# Patient Record
Sex: Male | Born: 1955 | Race: White | Hispanic: No | Smoking: Never smoker
Health system: Southern US, Community
[De-identification: ages and names within clinical notes are randomized; demographics above are authoritative.]

## PROBLEM LIST (undated history)

## (undated) DIAGNOSIS — E785 Hyperlipidemia, unspecified: Secondary | ICD-10-CM

## (undated) DIAGNOSIS — I10 Essential (primary) hypertension: Secondary | ICD-10-CM

## (undated) DIAGNOSIS — E119 Type 2 diabetes mellitus without complications: Secondary | ICD-10-CM

## (undated) DIAGNOSIS — C801 Malignant (primary) neoplasm, unspecified: Secondary | ICD-10-CM

## (undated) HISTORY — PX: NO PAST SURGERIES: SHX2092

## (undated) HISTORY — PX: SKIN CANCER EXCISION: SHX779

## (undated) HISTORY — PX: OTHER SURGICAL HISTORY: SHX169

---

## 1999-01-08 ENCOUNTER — Encounter: Admission: RE | Admit: 1999-01-08 | Discharge: 1999-01-08 | Payer: Self-pay | Admitting: Dermatology

## 1999-12-17 ENCOUNTER — Encounter: Payer: Self-pay | Admitting: Dermatology

## 1999-12-17 ENCOUNTER — Encounter: Admission: RE | Admit: 1999-12-17 | Discharge: 1999-12-17 | Payer: Self-pay | Admitting: Dermatology

## 2000-12-29 ENCOUNTER — Encounter: Payer: Self-pay | Admitting: Dermatology

## 2000-12-29 ENCOUNTER — Encounter: Admission: RE | Admit: 2000-12-29 | Discharge: 2000-12-29 | Payer: Self-pay | Admitting: Dermatology

## 2004-07-09 ENCOUNTER — Ambulatory Visit: Payer: Self-pay | Admitting: Internal Medicine

## 2005-01-01 ENCOUNTER — Ambulatory Visit: Payer: Self-pay | Admitting: Internal Medicine

## 2005-01-22 ENCOUNTER — Ambulatory Visit: Payer: Self-pay | Admitting: Internal Medicine

## 2005-07-04 ENCOUNTER — Ambulatory Visit: Payer: Self-pay | Admitting: Internal Medicine

## 2005-09-20 ENCOUNTER — Ambulatory Visit: Payer: Self-pay | Admitting: Internal Medicine

## 2006-08-07 DIAGNOSIS — I10 Essential (primary) hypertension: Secondary | ICD-10-CM | POA: Insufficient documentation

## 2006-08-07 DIAGNOSIS — J309 Allergic rhinitis, unspecified: Secondary | ICD-10-CM | POA: Insufficient documentation

## 2006-08-11 ENCOUNTER — Encounter: Payer: Self-pay | Admitting: Internal Medicine

## 2006-08-11 ENCOUNTER — Ambulatory Visit: Payer: Self-pay | Admitting: Internal Medicine

## 2006-08-11 DIAGNOSIS — E785 Hyperlipidemia, unspecified: Secondary | ICD-10-CM

## 2007-01-21 ENCOUNTER — Encounter (INDEPENDENT_AMBULATORY_CARE_PROVIDER_SITE_OTHER): Payer: Self-pay | Admitting: *Deleted

## 2007-02-17 ENCOUNTER — Ambulatory Visit: Payer: Self-pay | Admitting: Internal Medicine

## 2007-02-17 DIAGNOSIS — Z85820 Personal history of malignant melanoma of skin: Secondary | ICD-10-CM

## 2007-02-18 ENCOUNTER — Ambulatory Visit: Payer: Self-pay | Admitting: Internal Medicine

## 2007-02-20 LAB — CONVERTED CEMR LAB
ALT: 31 units/L (ref 0–53)
AST: 23 units/L (ref 0–37)
BUN: 17 mg/dL (ref 6–23)
CO2: 29 meq/L (ref 19–32)
Calcium: 9.8 mg/dL (ref 8.4–10.5)
Chloride: 104 meq/L (ref 96–112)
Cholesterol: 183 mg/dL (ref 0–200)
Creatinine, Ser: 0.8 mg/dL (ref 0.4–1.5)
GFR calc Af Amer: 131 mL/min
GFR calc non Af Amer: 108 mL/min
Glucose, Bld: 121 mg/dL — ABNORMAL HIGH (ref 70–99)
HDL: 42.4 mg/dL (ref >39.0)
Hemoglobin: 13 g/dL (ref 13.0–17.0)
LDL Cholesterol: 107 mg/dL — ABNORMAL HIGH (ref 0–99)
Potassium: 4.9 meq/L (ref 3.5–5.1)
Sodium: 140 meq/L (ref 135–145)
Total CHOL/HDL Ratio: 4.3
Triglycerides: 167 mg/dL — ABNORMAL HIGH (ref 0–149)
VLDL: 33 mg/dL (ref 0–40)

## 2007-02-26 LAB — CONVERTED CEMR LAB
ALT: 31 units/L (ref 0–53)
AST: 23 units/L (ref 0–37)
BUN: 17 mg/dL (ref 6–23)
CO2: 29 meq/L (ref 19–32)
Calcium: 9.8 mg/dL (ref 8.4–10.5)
Chloride: 104 meq/L (ref 96–112)
Cholesterol: 183 mg/dL (ref 0–200)
Creatinine, Ser: 0.8 mg/dL (ref 0.4–1.5)
GFR calc Af Amer: 131 mL/min
GFR calc non Af Amer: 108 mL/min
Glucose, Bld: 121 mg/dL — ABNORMAL HIGH (ref 70–99)
HDL: 42.4 mg/dL (ref >39.0)
Hemoglobin: 13 g/dL (ref 13.0–17.0)
Hgb A1c MFr Bld: 6.5 % — ABNORMAL HIGH (ref 4.6–6.0)
LDL Cholesterol: 107 mg/dL — ABNORMAL HIGH (ref 0–99)
Potassium: 4.9 meq/L (ref 3.5–5.1)
Sodium: 140 meq/L (ref 135–145)
Total CHOL/HDL Ratio: 4.3
Triglycerides: 167 mg/dL — ABNORMAL HIGH (ref 0–149)
VLDL: 33 mg/dL (ref 0–40)

## 2007-03-24 ENCOUNTER — Encounter: Payer: Self-pay | Admitting: Internal Medicine

## 2007-07-24 ENCOUNTER — Telehealth (INDEPENDENT_AMBULATORY_CARE_PROVIDER_SITE_OTHER): Payer: Self-pay | Admitting: *Deleted

## 2007-08-19 ENCOUNTER — Telehealth: Payer: Self-pay | Admitting: Internal Medicine

## 2007-10-12 ENCOUNTER — Ambulatory Visit: Payer: Self-pay | Admitting: Internal Medicine

## 2007-10-12 DIAGNOSIS — R7309 Other abnormal glucose: Secondary | ICD-10-CM

## 2007-10-19 LAB — CONVERTED CEMR LAB
ALT: 32 units/L (ref 0–53)
AST: 23 units/L (ref 0–37)
BUN: 17 mg/dL (ref 6–23)
CO2: 29 meq/L (ref 19–32)
Calcium: 9.6 mg/dL (ref 8.4–10.5)
Chloride: 105 meq/L (ref 96–112)
Creatinine, Ser: 0.9 mg/dL (ref 0.4–1.5)
Creatinine,U: 76.6 mg/dL
GFR calc Af Amer: 114 mL/min
GFR calc non Af Amer: 94 mL/min
Glucose, Bld: 119 mg/dL — ABNORMAL HIGH (ref 70–99)
Hgb A1c MFr Bld: 6.5 % — ABNORMAL HIGH (ref 4.6–6.0)
Microalb Creat Ratio: 6.5 mg/g (ref 0.0–30.0)
Microalb, Ur: 0.5 mg/dL (ref 0.0–1.9)
Potassium: 3.9 meq/L (ref 3.5–5.1)
Sodium: 141 meq/L (ref 135–145)

## 2008-09-06 ENCOUNTER — Telehealth (INDEPENDENT_AMBULATORY_CARE_PROVIDER_SITE_OTHER): Payer: Self-pay | Admitting: *Deleted

## 2008-12-14 ENCOUNTER — Encounter (INDEPENDENT_AMBULATORY_CARE_PROVIDER_SITE_OTHER): Payer: Self-pay | Admitting: *Deleted

## 2009-02-13 ENCOUNTER — Ambulatory Visit: Payer: Self-pay | Admitting: Internal Medicine

## 2009-02-13 LAB — CONVERTED CEMR LAB: Hgb A1c MFr Bld: 6.3 % (ref 4.6–6.5)

## 2009-02-20 LAB — CONVERTED CEMR LAB
ALT: 33 units/L (ref 0–53)
AST: 24 units/L (ref 0–37)
BUN: 15 mg/dL (ref 6–23)
Basophils Absolute: 0 10*3/uL (ref 0.0–0.1)
Basophils Relative: 0.7 % (ref 0.0–3.0)
CO2: 28 meq/L (ref 19–32)
Calcium: 9.2 mg/dL (ref 8.4–10.5)
Chloride: 104 meq/L (ref 96–112)
Cholesterol: 177 mg/dL (ref 0–200)
Creatinine, Ser: 0.9 mg/dL (ref 0.4–1.5)
Eosinophils Absolute: 0.1 10*3/uL (ref 0.0–0.7)
Eosinophils Relative: 2.4 % (ref 0.0–5.0)
GFR calc non Af Amer: 93.14 mL/min (ref >60)
Glucose, Bld: 136 mg/dL — ABNORMAL HIGH (ref 70–99)
HCT: 44.3 % (ref 39.0–52.0)
HDL: 47 mg/dL (ref >39.00)
Hemoglobin: 14.5 g/dL (ref 13.0–17.0)
LDL Cholesterol: 109 mg/dL — ABNORMAL HIGH (ref 0–99)
Lymphocytes Relative: 23.8 % (ref 12.0–46.0)
Lymphs Abs: 1.2 10*3/uL (ref 0.7–4.0)
MCHC: 32.8 g/dL (ref 30.0–36.0)
MCV: 95.8 fL (ref 78.0–100.0)
Monocytes Absolute: 0.4 10*3/uL (ref 0.1–1.0)
Monocytes Relative: 7.4 % (ref 3.0–12.0)
Neutro Abs: 3.2 10*3/uL (ref 1.4–7.7)
Neutrophils Relative %: 65.7 % (ref 43.0–77.0)
Platelets: 197 10*3/uL (ref 150.0–400.0)
Potassium: 4.2 meq/L (ref 3.5–5.1)
RBC: 4.62 M/uL (ref 4.22–5.81)
RDW: 12.3 % (ref 11.5–14.6)
Sodium: 138 meq/L (ref 135–145)
Total CHOL/HDL Ratio: 4
Triglycerides: 105 mg/dL (ref 0.0–149.0)
VLDL: 21 mg/dL (ref 0.0–40.0)
WBC: 4.9 10*3/uL (ref 4.5–10.5)

## 2009-10-31 ENCOUNTER — Telehealth (INDEPENDENT_AMBULATORY_CARE_PROVIDER_SITE_OTHER): Payer: Self-pay | Admitting: *Deleted

## 2009-11-22 ENCOUNTER — Telehealth (INDEPENDENT_AMBULATORY_CARE_PROVIDER_SITE_OTHER): Payer: Self-pay | Admitting: *Deleted

## 2010-04-26 NOTE — Progress Notes (Signed)
Summary: refill request/appt--has problem --no time off  Phone Note Refill Request Call back at [phone] Message from:  Pharmacy on November 22, 2009 8:01 AM  Refills Requested: Medication #1:  ZOCOR 40 MG TABS 1 by mouth at bedtime. NEEDS CPX.Marland Kitchen   Dosage confirmed as above?Dosage Confirmed   Supply Requested: 3 months [facility] [address]  Next Appointment Scheduled: none Initial call taken by: Lavell Islam,  November 22, 2009 8:02 AM  Follow-up for Phone Call        Pt has been called and we mailed a letter to let pt know they needed a CPX. No pending appts. Army Fossa CMA  November 22, 2009 8:21 AM   Additional Follow-up for Phone Call Additional follow up Details #1::        denied Jose E. Paz MD  November 22, 2009 3:53 PM     Additional Follow-up for Phone Call Additional follow up Details #2::    Please call pt to see if he will schedule appt, cannot refill meds. Army Fossa CMA  November 22, 2009 3:59 PM   Additional Follow-up for Phone Call Additional follow up Details #3:: Details for Additional Follow-up Action Taken: lmtcb.Karoline Caldwell Negrete  November 23, 2009 12:11 PM  LMTCB.Jerolyn Shin  November 28, 2009 9:48 AM  Patient wont get another day off until Thanksgiving week---has used up all vacation time for this year---what should he do??  ..Jerolyn Shin  November 29, 2009 11:27 AM needs to talk  with his supervisor, he does not need a whole day off, if he comes at 8am he will be at work by 9 AM Jose E. 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 Paz MD  December 01, 2009 9:48 AM   Will mail another letter advising him that he does need a physical.  If he gets any time off, will ask him to call  to see if he can get on the schedule.  If not, can we schedule a physical for the time that he is off around Thanksgiving? Marland KitchenJerolyn Shin  December 12, 2009 8:53 AM

## 2010-04-26 NOTE — Progress Notes (Signed)
Summary: CPX needed - mailed letter  Phone Note Outgoing Call   Summary of Call: I refilled pts meds for 1 month only---- needs to schedule CPX. Army Fossa CMA  October 31, 2009 7:51 AM   Follow-up for Phone Call        lmom to schedule cpx .Marland KitchenOkey Regal Spring  November 01, 2009 11:15 AM patient didnt return call - mailed letter  .Marland KitchenOkey Regal Spring  November 03, 2009 10:58 AM

## 2011-08-12 ENCOUNTER — Encounter: Payer: Self-pay | Admitting: Internal Medicine

## 2012-07-02 ENCOUNTER — Encounter: Payer: Self-pay | Admitting: Internal Medicine

## 2014-02-28 ENCOUNTER — Other Ambulatory Visit: Payer: Self-pay | Admitting: Family Medicine

## 2014-02-28 DIAGNOSIS — N50819 Testicular pain, unspecified: Secondary | ICD-10-CM

## 2014-02-28 DIAGNOSIS — N5089 Other specified disorders of the male genital organs: Secondary | ICD-10-CM

## 2014-03-01 ENCOUNTER — Ambulatory Visit
Admission: RE | Admit: 2014-03-01 | Discharge: 2014-03-01 | Disposition: A | Discharge status: Home / Self Care | Payer: BC Managed Care – PPO | Source: Ambulatory Visit | Attending: Family Medicine | Admitting: Family Medicine

## 2014-03-01 ENCOUNTER — Encounter (INDEPENDENT_AMBULATORY_CARE_PROVIDER_SITE_OTHER): Payer: Self-pay

## 2014-03-01 DIAGNOSIS — N50819 Testicular pain, unspecified: Secondary | ICD-10-CM

## 2014-03-01 DIAGNOSIS — N5089 Other specified disorders of the male genital organs: Secondary | ICD-10-CM

## 2016-05-15 IMAGING — US US SCROTUM
1 series · 14 of 25 positions shown · non-contrast
Comparison: None.

CLINICAL DATA: Bilateral scrotal swelling and pain

EXAM:
SCROTAL ULTRASOUND
DOPPLER ULTRASOUND OF THE TESTICLES
TECHNIQUE: Complete ultrasound examination of the testicles, epididymis, and
other scrotal structures was performed. Color and spectral Doppler
ultrasound were also utilized to evaluate blood flow to the
testicles.

[Series 1: us scrotum · 0.11mm/px · 14 of 81 slices shown]
[im 1/81]
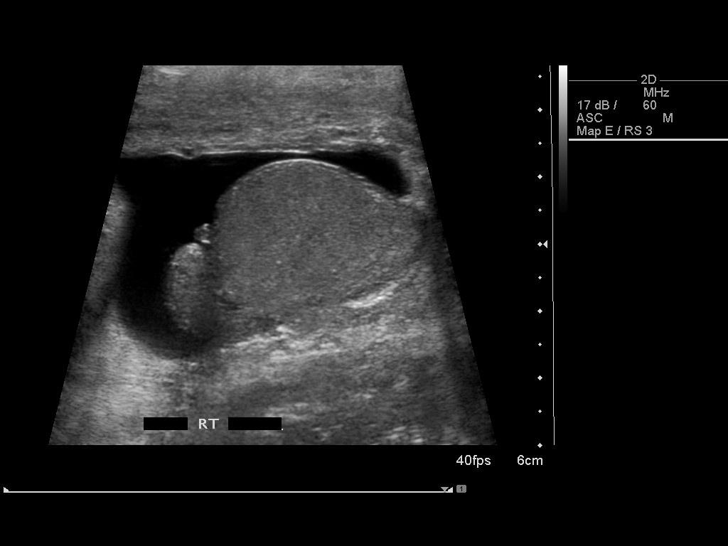
[im 7/81]
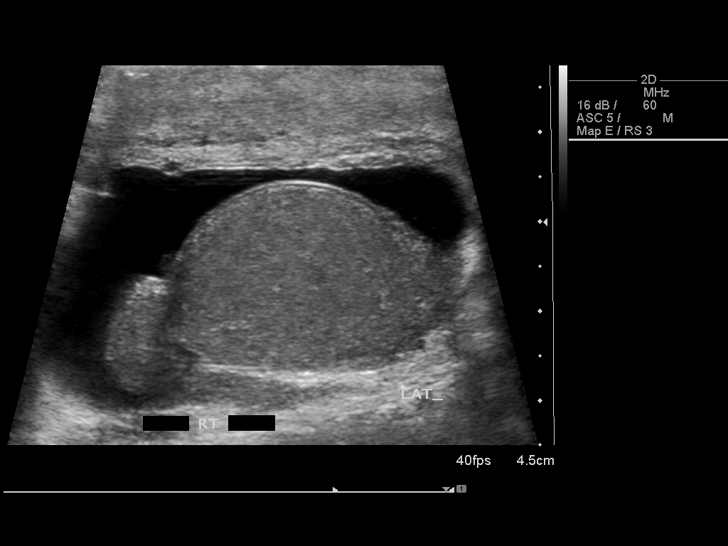
[im 14/81]
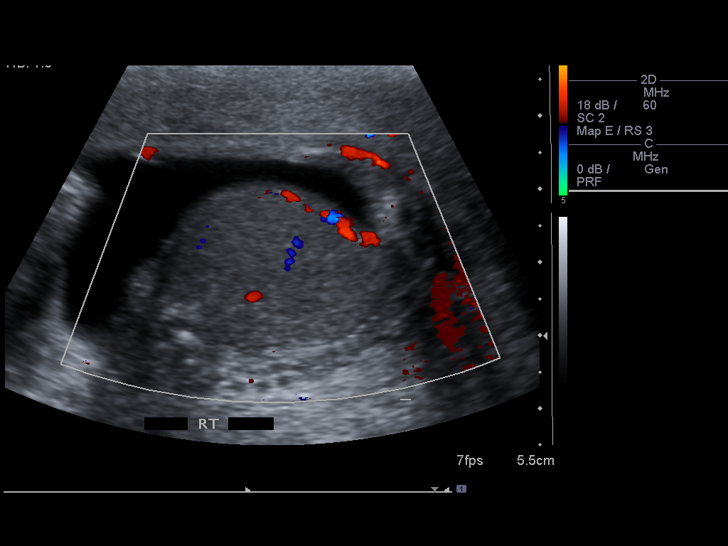
[im 21/81]
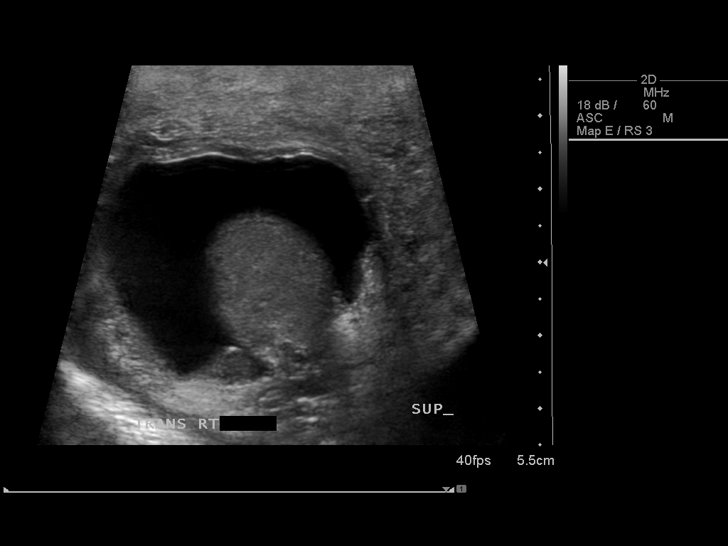
[im 27/81]
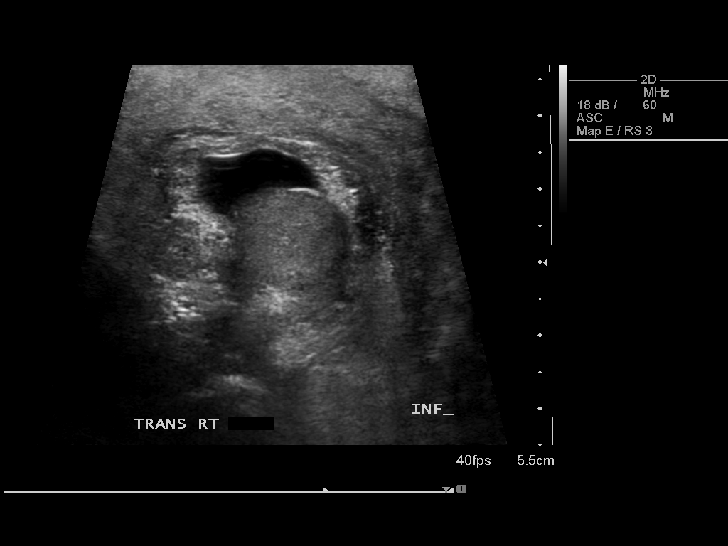
[im 31/81]
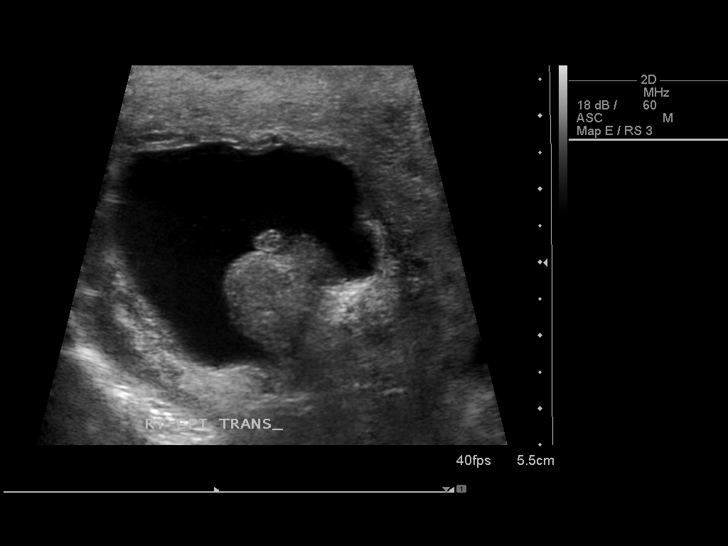
[im 37/81]
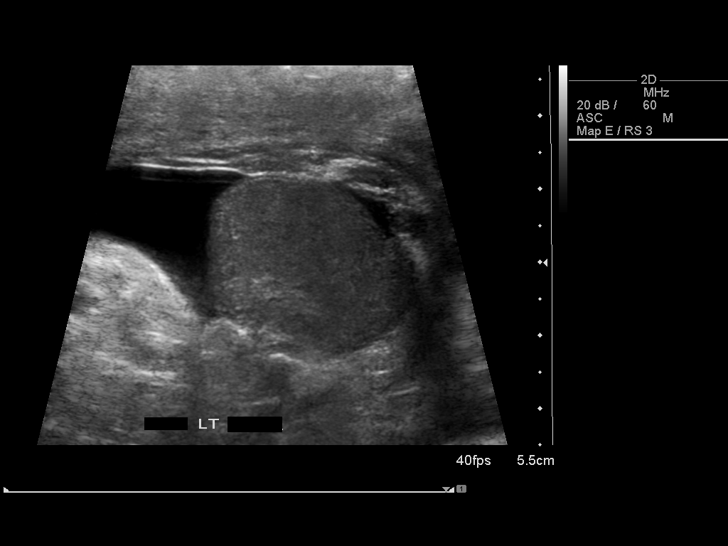
[im 44/81]
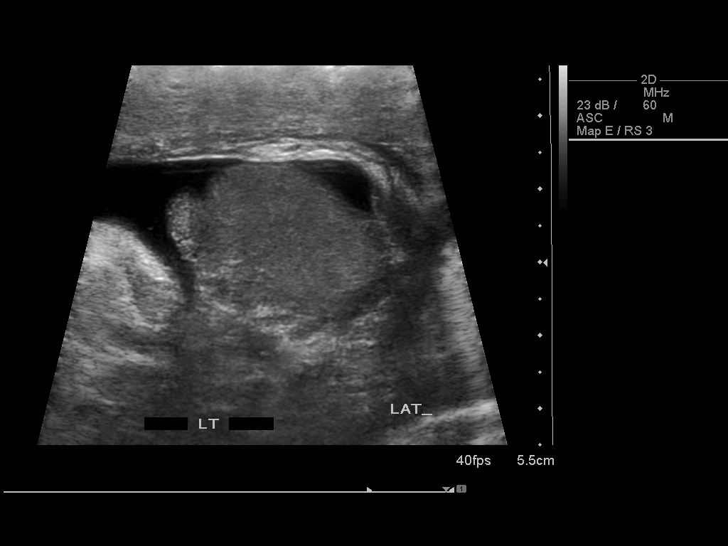
[im 51/81]
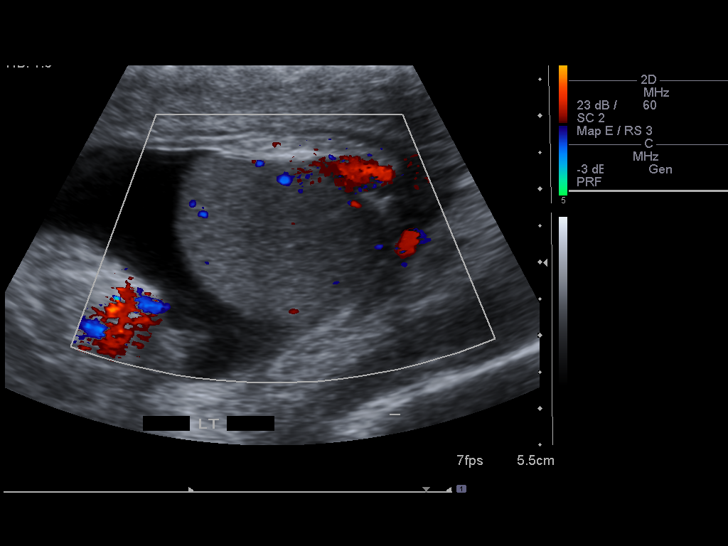
[im 54/81]
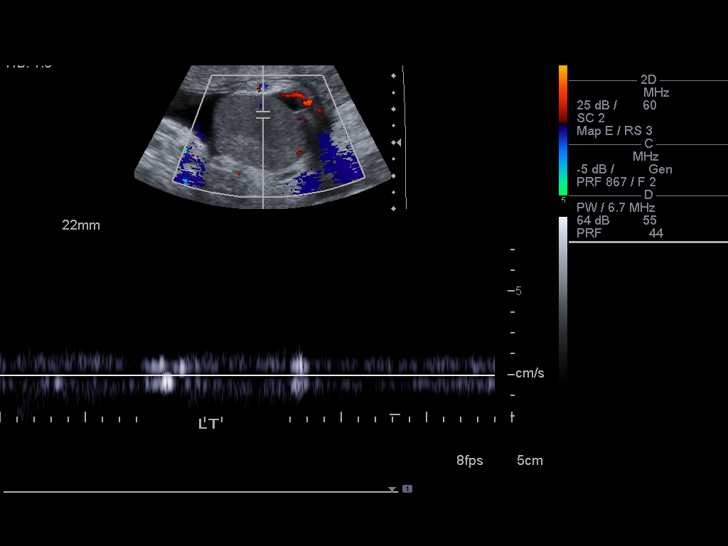
[im 61/81]
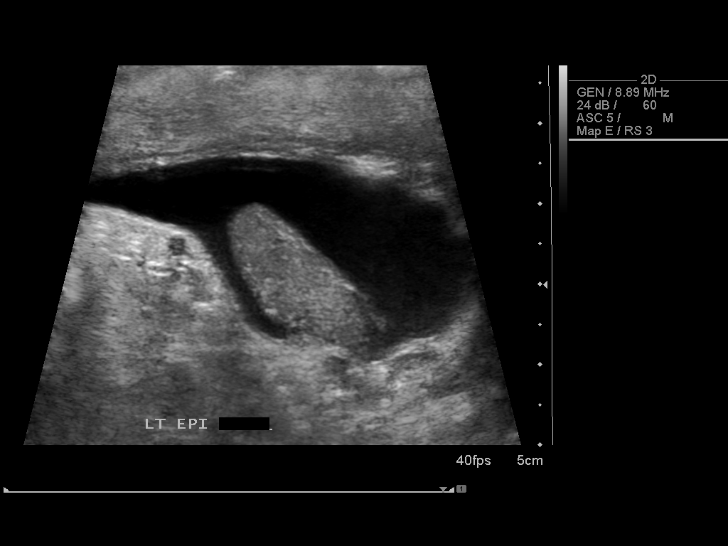
[im 67/81]
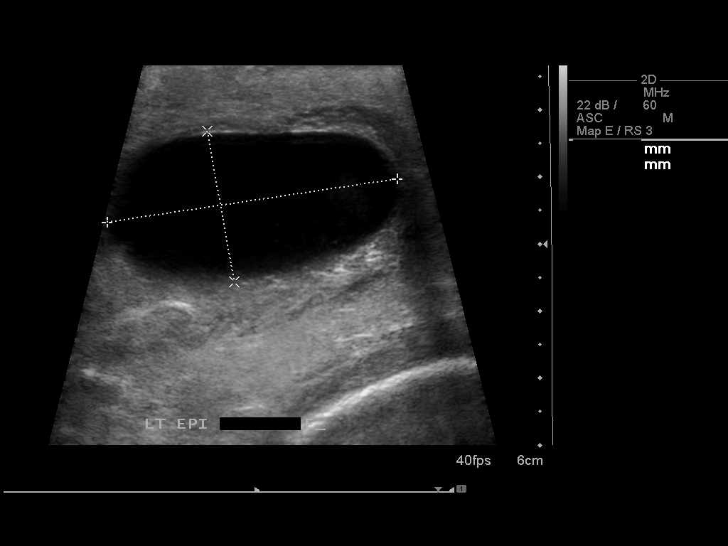
[im 74/81]
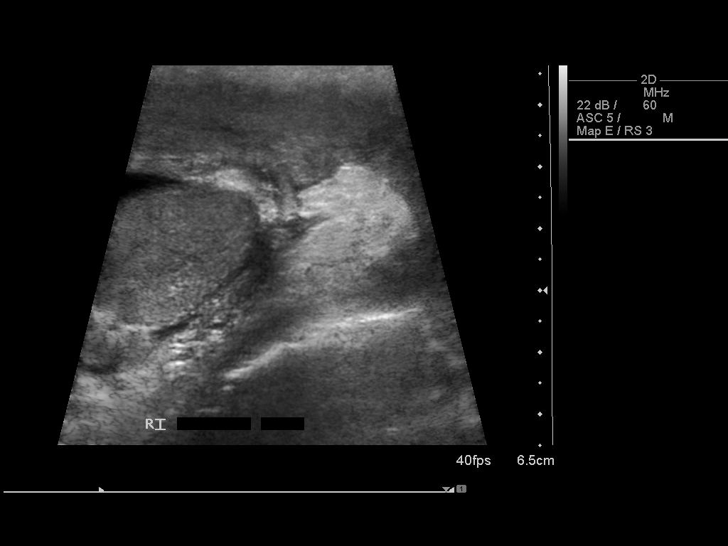
[im 81/81]
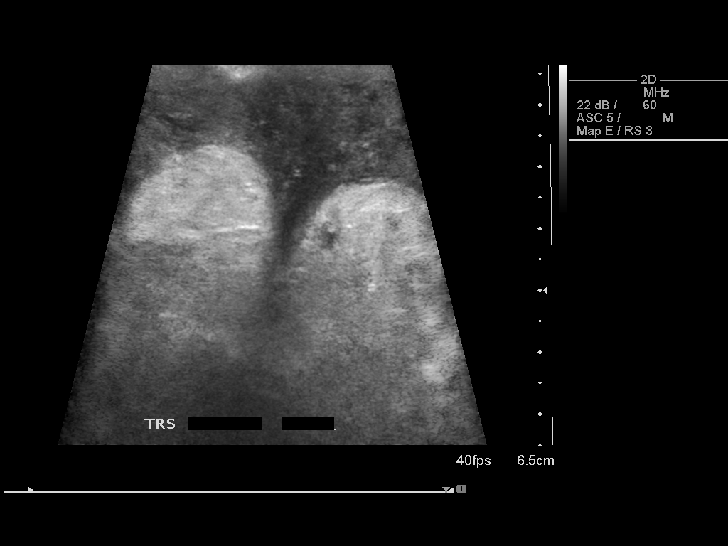

[14 of 25 positions shown; findings below may reference images not displayed]

FINDINGS: Right testicle

Measurements: 3.2 x 2.3 x 2.5 cm. No mass or microlithiasis
visualized.

Left testicle

Measurements: 2.9 x 2.4 x 2.3 cm. No mass or microlithiasis
visualized.

Right epididymis:  Normal in size and appearance.

Left epididymis:  Normal in size and appearance.

Hydrocele:  Moderate bilateral hydroceles.

Varicocele:  None visualized.

Pulsed Doppler interrogation of both testes demonstrates low
resistance arterial and venous waveforms bilaterally.

Additional comments: Scrotal wall thickening/edema. No discrete
abscess is visualized.
IMPRESSION: Normal sonographic appearance of the bilateral testes.

No evidence of testicular torsion.

Moderate bilateral hydroceles.

Scrotal wall thickening/edema.  No discrete abscess is visualized.

## 2019-05-27 ENCOUNTER — Ambulatory Visit: Payer: Medicare Other | Attending: Internal Medicine

## 2019-05-27 DIAGNOSIS — Z23 Encounter for immunization: Secondary | ICD-10-CM

## 2019-05-27 NOTE — Progress Notes (Signed)
   Covid-19 Vaccination Clinic  Name:  JACOBY ZANNI    MRN: [id number] DOB: 06-11-1955  05/27/2019  Mr. Wahlen was observed post Covid-19 immunization for 15 minutes without incident. He was provided with Vaccine Information Sheet and instruction to access the V-Safe system.   Mr. Jorgensen was instructed to call 911 with any severe reactions post vaccine: Marland Kitchen Difficulty breathing  . Swelling of face and throat  . A fast heartbeat  . A bad rash all over body  . Dizziness and weakness

## 2019-06-21 ENCOUNTER — Ambulatory Visit: Payer: Medicare Other | Attending: Internal Medicine

## 2019-06-21 DIAGNOSIS — Z23 Encounter for immunization: Secondary | ICD-10-CM

## 2019-06-21 NOTE — Progress Notes (Signed)
° °  Covid-19 Vaccination Clinic  Name:  Dwayne Nelson    MRN: [id number] DOB: 12-Sep-1955  06/21/2019  [name] was observed post Covid-19 immunization for 15 minutes without incident. He was provided with Vaccine Information Sheet and instruction to access the V-Safe system.   [name] was instructed to call 911 with any severe reactions post vaccine:  Difficulty breathing   Swelling of face and throat   A fast heartbeat   A bad rash all over body   Dizziness and weakness   Immunizations Administered    Name Date Dose VIS Date Route   Pfizer COVID-19 Vaccine 06/21/2019 12:12 PM 0.3 mL 03/05/2019 Intramuscular   Manufacturer: [employer], [employer]   Lot: [id number]   NDC: [address]-4

## 2020-05-11 DIAGNOSIS — E119 Type 2 diabetes mellitus without complications: Secondary | ICD-10-CM | POA: Diagnosis not present

## 2020-05-11 DIAGNOSIS — E782 Mixed hyperlipidemia: Secondary | ICD-10-CM | POA: Diagnosis not present

## 2020-05-11 DIAGNOSIS — E1165 Type 2 diabetes mellitus with hyperglycemia: Secondary | ICD-10-CM | POA: Diagnosis not present

## 2020-05-11 DIAGNOSIS — I10 Essential (primary) hypertension: Secondary | ICD-10-CM | POA: Diagnosis not present

## 2020-05-19 DIAGNOSIS — F5101 Primary insomnia: Secondary | ICD-10-CM | POA: Diagnosis not present

## 2020-05-19 DIAGNOSIS — E782 Mixed hyperlipidemia: Secondary | ICD-10-CM | POA: Diagnosis not present

## 2020-05-19 DIAGNOSIS — E1165 Type 2 diabetes mellitus with hyperglycemia: Secondary | ICD-10-CM | POA: Diagnosis not present

## 2020-05-19 DIAGNOSIS — E291 Testicular hypofunction: Secondary | ICD-10-CM | POA: Diagnosis not present

## 2020-05-19 DIAGNOSIS — Z125 Encounter for screening for malignant neoplasm of prostate: Secondary | ICD-10-CM | POA: Diagnosis not present

## 2020-05-19 DIAGNOSIS — I10 Essential (primary) hypertension: Secondary | ICD-10-CM | POA: Diagnosis not present

## 2020-06-13 DIAGNOSIS — E119 Type 2 diabetes mellitus without complications: Secondary | ICD-10-CM | POA: Diagnosis not present

## 2020-06-13 DIAGNOSIS — E1165 Type 2 diabetes mellitus with hyperglycemia: Secondary | ICD-10-CM | POA: Diagnosis not present

## 2020-06-13 DIAGNOSIS — I10 Essential (primary) hypertension: Secondary | ICD-10-CM | POA: Diagnosis not present

## 2020-06-13 DIAGNOSIS — E782 Mixed hyperlipidemia: Secondary | ICD-10-CM | POA: Diagnosis not present

## 2020-07-21 DIAGNOSIS — I10 Essential (primary) hypertension: Secondary | ICD-10-CM | POA: Diagnosis not present

## 2020-07-21 DIAGNOSIS — E782 Mixed hyperlipidemia: Secondary | ICD-10-CM | POA: Diagnosis not present

## 2020-07-21 DIAGNOSIS — E119 Type 2 diabetes mellitus without complications: Secondary | ICD-10-CM | POA: Diagnosis not present

## 2020-07-21 DIAGNOSIS — E1165 Type 2 diabetes mellitus with hyperglycemia: Secondary | ICD-10-CM | POA: Diagnosis not present

## 2020-09-05 DIAGNOSIS — E119 Type 2 diabetes mellitus without complications: Secondary | ICD-10-CM | POA: Diagnosis not present

## 2020-09-05 DIAGNOSIS — E782 Mixed hyperlipidemia: Secondary | ICD-10-CM | POA: Diagnosis not present

## 2020-09-05 DIAGNOSIS — E1165 Type 2 diabetes mellitus with hyperglycemia: Secondary | ICD-10-CM | POA: Diagnosis not present

## 2020-09-05 DIAGNOSIS — I10 Essential (primary) hypertension: Secondary | ICD-10-CM | POA: Diagnosis not present

## 2020-10-05 DIAGNOSIS — E119 Type 2 diabetes mellitus without complications: Secondary | ICD-10-CM | POA: Diagnosis not present

## 2020-10-05 DIAGNOSIS — E1165 Type 2 diabetes mellitus with hyperglycemia: Secondary | ICD-10-CM | POA: Diagnosis not present

## 2020-10-05 DIAGNOSIS — E782 Mixed hyperlipidemia: Secondary | ICD-10-CM | POA: Diagnosis not present

## 2020-10-05 DIAGNOSIS — I10 Essential (primary) hypertension: Secondary | ICD-10-CM | POA: Diagnosis not present

## 2020-11-20 DIAGNOSIS — E782 Mixed hyperlipidemia: Secondary | ICD-10-CM | POA: Diagnosis not present

## 2020-11-20 DIAGNOSIS — I10 Essential (primary) hypertension: Secondary | ICD-10-CM | POA: Diagnosis not present

## 2020-11-20 DIAGNOSIS — E1165 Type 2 diabetes mellitus with hyperglycemia: Secondary | ICD-10-CM | POA: Diagnosis not present

## 2020-11-20 DIAGNOSIS — E291 Testicular hypofunction: Secondary | ICD-10-CM | POA: Diagnosis not present

## 2020-11-30 DIAGNOSIS — I10 Essential (primary) hypertension: Secondary | ICD-10-CM | POA: Diagnosis not present

## 2020-11-30 DIAGNOSIS — E782 Mixed hyperlipidemia: Secondary | ICD-10-CM | POA: Diagnosis not present

## 2020-11-30 DIAGNOSIS — E119 Type 2 diabetes mellitus without complications: Secondary | ICD-10-CM | POA: Diagnosis not present

## 2020-11-30 DIAGNOSIS — E1165 Type 2 diabetes mellitus with hyperglycemia: Secondary | ICD-10-CM | POA: Diagnosis not present

## 2020-12-26 DIAGNOSIS — E119 Type 2 diabetes mellitus without complications: Secondary | ICD-10-CM | POA: Diagnosis not present

## 2020-12-26 DIAGNOSIS — E1165 Type 2 diabetes mellitus with hyperglycemia: Secondary | ICD-10-CM | POA: Diagnosis not present

## 2020-12-26 DIAGNOSIS — E782 Mixed hyperlipidemia: Secondary | ICD-10-CM | POA: Diagnosis not present

## 2020-12-26 DIAGNOSIS — I10 Essential (primary) hypertension: Secondary | ICD-10-CM | POA: Diagnosis not present

## 2020-12-29 DIAGNOSIS — N5201 Erectile dysfunction due to arterial insufficiency: Secondary | ICD-10-CM | POA: Diagnosis not present

## 2020-12-29 DIAGNOSIS — R351 Nocturia: Secondary | ICD-10-CM | POA: Diagnosis not present

## 2020-12-29 DIAGNOSIS — N471 Phimosis: Secondary | ICD-10-CM | POA: Diagnosis not present

## 2020-12-29 DIAGNOSIS — R3915 Urgency of urination: Secondary | ICD-10-CM | POA: Diagnosis not present

## 2020-12-29 DIAGNOSIS — R350 Frequency of micturition: Secondary | ICD-10-CM | POA: Diagnosis not present

## 2021-01-31 DIAGNOSIS — R350 Frequency of micturition: Secondary | ICD-10-CM | POA: Diagnosis not present

## 2021-01-31 DIAGNOSIS — N4883 Acquired buried penis: Secondary | ICD-10-CM | POA: Diagnosis not present

## 2021-01-31 DIAGNOSIS — N471 Phimosis: Secondary | ICD-10-CM | POA: Diagnosis not present

## 2021-01-31 DIAGNOSIS — N5201 Erectile dysfunction due to arterial insufficiency: Secondary | ICD-10-CM | POA: Diagnosis not present

## 2021-02-08 ENCOUNTER — Other Ambulatory Visit: Payer: Self-pay | Admitting: Urology

## 2021-03-05 DIAGNOSIS — E782 Mixed hyperlipidemia: Secondary | ICD-10-CM | POA: Diagnosis not present

## 2021-03-05 DIAGNOSIS — E119 Type 2 diabetes mellitus without complications: Secondary | ICD-10-CM | POA: Diagnosis not present

## 2021-03-05 DIAGNOSIS — I10 Essential (primary) hypertension: Secondary | ICD-10-CM | POA: Diagnosis not present

## 2021-03-05 DIAGNOSIS — E1165 Type 2 diabetes mellitus with hyperglycemia: Secondary | ICD-10-CM | POA: Diagnosis not present

## 2021-03-14 DIAGNOSIS — R3915 Urgency of urination: Secondary | ICD-10-CM | POA: Diagnosis not present

## 2021-03-14 DIAGNOSIS — N4883 Acquired buried penis: Secondary | ICD-10-CM | POA: Diagnosis not present

## 2021-03-14 DIAGNOSIS — N471 Phimosis: Secondary | ICD-10-CM | POA: Diagnosis not present

## 2021-03-22 ENCOUNTER — Other Ambulatory Visit: Payer: Self-pay

## 2021-03-22 ENCOUNTER — Encounter (HOSPITAL_BASED_OUTPATIENT_CLINIC_OR_DEPARTMENT_OTHER): Payer: Self-pay | Admitting: Urology

## 2021-03-22 NOTE — Progress Notes (Addendum)
Spoke w/ via phone for pre-op interview--- [employer]----       ISTAT, EKG        Lab results------ COVID test -----patient states asymptomatic no test needed Arrive at ------- NPO after MN NO Solid Food.  Clear liquids from MN until---0900 Med rec completed Medications to take morning of surgery ----- no meds AM of surgery.  Diabetic medication ----- No DM meds DOS. 1/2 insulin dose night before, pt verbalized understanding. Patient instructed no nail polish to be worn day of surgery Patient instructed to bring photo id and insurance card day of surgery Patient aware to have Driver (ride ) / caregiver    for 24 hours after surgery  Patient Special Instructions ----- Pre-Op special Istructions ----- Patient verbalized understanding of instructions that were given at this phone interview. Patient denies shortness of breath, chest pain, fever, cough at this phone interview.   Anesthesia Review:  PCP: Cardiologist : Chest x-ray : EKG : Echo : Stress test: Cardiac Cath :  Activity level:  Sleep Study/ CPAP : Fasting Blood Sugar : 110-150      / Checks Blood Sugar -- times a day:   Continuous glucose monitor L arm. Blood Thinner/ Instructions Maurice Small Dose: ASA / Instructions/ Last Dose :

## 2021-03-28 NOTE — H&P (Signed)
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  Past Medical History:  Diagnosis Date   Diabetes mellitus without complication (HCC)    Hyperlipidemia    Hypertension     Past Surgical History:  Procedure Laterality Date   colonoscopy N/A    NO PAST SURGERIES      Home Medications:  Allergies as of 03/28/2021   No Known Allergies      Medication List      Notice   Cannot display discharge medications because the patient has not yet been admitted.     Allergies: No Known Allergies  History reviewed. No pertinent family history.  Social History:  reports that he has never smoked. He has never used smokeless tobacco. He reports that he does not currently use alcohol. He reports that he does not use drugs.  ROS: A complete review of systems was performed.  All systems are negative except for pertinent findings as noted.  Physical Exam:  Vital signs in last 24 hours: Ht 5\' 10"  (1.778 m)    Wt 117.9 kg    BMI 37.31 kg/m  Constitutional:  Alert and oriented, No acute distress. Obese. Cardiovascular: Regular rate  Respiratory: Normal respiratory effort GI: Abdomen is soft, nontender, nondistended, no abdominal masses. No CVAT.  Genitourinary: Uncircumcised, buried phallus /w phimosis, testes are descended bilaterally and non-tender and without masses, scrotum is normal in appearance without lesions or masses, perineum is normal on inspection. Lymphatic: No lymphadenopathy Neurologic: Grossly intact, no focal deficits Psychiatric: Normal mood and affect  I have reviewed prior pt notes     Impression/Assessment:  Buried penis with phimosis  Plan:  Dorsal slit circumcision

## 2021-03-29 ENCOUNTER — Encounter (HOSPITAL_BASED_OUTPATIENT_CLINIC_OR_DEPARTMENT_OTHER): Payer: Self-pay | Admitting: Urology

## 2021-03-29 ENCOUNTER — Ambulatory Visit (HOSPITAL_BASED_OUTPATIENT_CLINIC_OR_DEPARTMENT_OTHER): Payer: Medicare Other | Admitting: Anesthesiology

## 2021-03-29 ENCOUNTER — Encounter (HOSPITAL_BASED_OUTPATIENT_CLINIC_OR_DEPARTMENT_OTHER)
Admission: RE | Disposition: A | Discharge status: Home / Self Care | Payer: Self-pay | Source: Home / Self Care | Attending: Urology

## 2021-03-29 ENCOUNTER — Other Ambulatory Visit: Payer: Self-pay

## 2021-03-29 ENCOUNTER — Ambulatory Visit (HOSPITAL_BASED_OUTPATIENT_CLINIC_OR_DEPARTMENT_OTHER)
Admission: RE | Admit: 2021-03-29 | Discharge: 2021-03-29 | Disposition: A | Discharge status: Home / Self Care | Payer: Medicare Other | Attending: Urology | Admitting: Urology

## 2021-03-29 DIAGNOSIS — G4730 Sleep apnea, unspecified: Secondary | ICD-10-CM | POA: Insufficient documentation

## 2021-03-29 DIAGNOSIS — N471 Phimosis: Secondary | ICD-10-CM | POA: Diagnosis not present

## 2021-03-29 DIAGNOSIS — Z794 Long term (current) use of insulin: Secondary | ICD-10-CM | POA: Diagnosis not present

## 2021-03-29 DIAGNOSIS — I10 Essential (primary) hypertension: Secondary | ICD-10-CM | POA: Diagnosis not present

## 2021-03-29 DIAGNOSIS — E119 Type 2 diabetes mellitus without complications: Secondary | ICD-10-CM | POA: Diagnosis not present

## 2021-03-29 DIAGNOSIS — Z6839 Body mass index (BMI) 39.0-39.9, adult: Secondary | ICD-10-CM | POA: Insufficient documentation

## 2021-03-29 DIAGNOSIS — Z79899 Other long term (current) drug therapy: Secondary | ICD-10-CM | POA: Diagnosis not present

## 2021-03-29 DIAGNOSIS — N4883 Acquired buried penis: Secondary | ICD-10-CM | POA: Insufficient documentation

## 2021-03-29 DIAGNOSIS — Z7984 Long term (current) use of oral hypoglycemic drugs: Secondary | ICD-10-CM | POA: Insufficient documentation

## 2021-03-29 DIAGNOSIS — E669 Obesity, unspecified: Secondary | ICD-10-CM | POA: Insufficient documentation

## 2021-03-29 HISTORY — DX: Hyperlipidemia, unspecified: E78.5

## 2021-03-29 HISTORY — PX: CIRCUMCISION: SHX1350

## 2021-03-29 HISTORY — DX: Type 2 diabetes mellitus without complications: E11.9

## 2021-03-29 HISTORY — DX: Essential (primary) hypertension: I10

## 2021-03-29 LAB — POCT I-STAT, CHEM 8
BUN: 17 mg/dL (ref 8–23)
Calcium, Ion: 1.18 mmol/L (ref 1.15–1.40)
Chloride: 102 mmol/L (ref 98–111)
Creatinine, Ser: 0.9 mg/dL (ref 0.61–1.24)
Glucose, Bld: 168 mg/dL — ABNORMAL HIGH (ref 70–99)
HCT: 55 % — ABNORMAL HIGH (ref 39.0–52.0)
Hemoglobin: 18.7 g/dL — ABNORMAL HIGH (ref 13.0–17.0)
Potassium: 4 mmol/L (ref 3.5–5.1)
Sodium: 140 mmol/L (ref 135–145)
TCO2: 26 mmol/L (ref 22–32)

## 2021-03-29 LAB — GLUCOSE, CAPILLARY: Glucose-Capillary: 170 mg/dL — ABNORMAL HIGH (ref 70–99)

## 2021-03-29 SURGERY — CIRCUMCISION, ADULT
Anesthesia: General | Site: Penis

## 2021-03-29 MED ORDER — EPHEDRINE SULFATE 50 MG/ML IJ SOLN
INTRAMUSCULAR | Status: DC | PRN
Start: 2021-03-29 — End: 2021-03-29
  Administered 2021-03-29 (×2): 20 mg via INTRAVENOUS

## 2021-03-29 MED ORDER — ONDANSETRON HCL 4 MG/2ML IJ SOLN: 4.0000 mg | Freq: Once | INTRAMUSCULAR | Status: DC | PRN

## 2021-03-29 MED ORDER — PROPOFOL 10 MG/ML IV BOLUS
INTRAVENOUS | Status: DC | PRN
  Administered 2021-03-29: 200 mg via INTRAVENOUS

## 2021-03-29 MED ORDER — ACETAMINOPHEN 500 MG PO TABS
1000.0000 mg | ORAL_TABLET | Freq: Once | ORAL | Status: AC
  Administered 2021-03-29: 1000 mg via ORAL

## 2021-03-29 MED ORDER — BUPIVACAINE HCL 0.25 % IJ SOLN
INTRAMUSCULAR | Status: DC | PRN
  Administered 2021-03-29: 2.5 mL
  Administered 2021-03-29: 5 mL

## 2021-03-29 MED ORDER — 0.9 % SODIUM CHLORIDE (POUR BTL) OPTIME
TOPICAL | Status: DC | PRN
  Administered 2021-03-29: 500 mL

## 2021-03-29 MED ORDER — FENTANYL CITRATE (PF) 100 MCG/2ML IJ SOLN
INTRAMUSCULAR | Status: AC
  Filled 2021-03-29: qty 2

## 2021-03-29 MED ORDER — LIDOCAINE HCL 1 % IJ SOLN
INTRAMUSCULAR | Status: DC | PRN
  Administered 2021-03-29: 5 mL
  Administered 2021-03-29: 2.5 mL

## 2021-03-29 MED ORDER — FENTANYL CITRATE (PF) 100 MCG/2ML IJ SOLN
INTRAMUSCULAR | Status: DC | PRN
Start: 2021-03-29 — End: 2021-03-29
  Administered 2021-03-29 (×2): 25 ug via INTRAVENOUS
  Administered 2021-03-29: 50 ug via INTRAVENOUS
  Administered 2021-03-29 (×2): 25 ug via INTRAVENOUS

## 2021-03-29 MED ORDER — LIDOCAINE HCL (CARDIAC) PF 100 MG/5ML IV SOSY
PREFILLED_SYRINGE | INTRAVENOUS | Status: DC | PRN
  Administered 2021-03-29: 100 mg via INTRAVENOUS

## 2021-03-29 MED ORDER — FENTANYL CITRATE (PF) 100 MCG/2ML IJ SOLN: 25.0000 ug | INTRAMUSCULAR | Status: DC | PRN

## 2021-03-29 MED ORDER — ONDANSETRON HCL 4 MG/2ML IJ SOLN
INTRAMUSCULAR | Status: AC
  Filled 2021-03-29: qty 2

## 2021-03-29 MED ORDER — ONDANSETRON HCL 4 MG/2ML IJ SOLN
INTRAMUSCULAR | Status: DC | PRN
  Administered 2021-03-29: 4 mg via INTRAVENOUS

## 2021-03-29 MED ORDER — LIDOCAINE 2% (20 MG/ML) 5 ML SYRINGE
INTRAMUSCULAR | Status: AC
  Filled 2021-03-29: qty 5

## 2021-03-29 MED ORDER — CEFAZOLIN SODIUM-DEXTROSE 2-4 GM/100ML-% IV SOLN
INTRAVENOUS | Status: AC
  Filled 2021-03-29: qty 100

## 2021-03-29 MED ORDER — AMISULPRIDE (ANTIEMETIC) 5 MG/2ML IV SOLN: 10.0000 mg | Freq: Once | INTRAVENOUS | Status: DC | PRN

## 2021-03-29 MED ORDER — PROPOFOL 10 MG/ML IV BOLUS
INTRAVENOUS | Status: AC
  Filled 2021-03-29: qty 20

## 2021-03-29 MED ORDER — LACTATED RINGERS IV SOLN: INTRAVENOUS | Status: DC

## 2021-03-29 MED ORDER — MIDAZOLAM HCL 5 MG/5ML IJ SOLN
INTRAMUSCULAR | Status: DC | PRN
  Administered 2021-03-29: 2 mg via INTRAVENOUS

## 2021-03-29 MED ORDER — MIDAZOLAM HCL 2 MG/2ML IJ SOLN
INTRAMUSCULAR | Status: AC
  Filled 2021-03-29: qty 2

## 2021-03-29 MED ORDER — CEFAZOLIN SODIUM-DEXTROSE 2-4 GM/100ML-% IV SOLN
2.0000 g | INTRAVENOUS | Status: AC
  Administered 2021-03-29: 2 g via INTRAVENOUS

## 2021-03-29 MED ORDER — OXYCODONE HCL 5 MG/5ML PO SOLN: 5.0000 mg | Freq: Once | ORAL | Status: DC | PRN

## 2021-03-29 MED ORDER — DEXAMETHASONE SODIUM PHOSPHATE 4 MG/ML IJ SOLN
INTRAMUSCULAR | Status: DC | PRN
  Administered 2021-03-29: 5 mg via INTRAVENOUS

## 2021-03-29 MED ORDER — ACETAMINOPHEN 500 MG PO TABS
ORAL_TABLET | ORAL | Status: AC
  Filled 2021-03-29: qty 2

## 2021-03-29 MED ORDER — OXYCODONE HCL 5 MG PO TABS: 5.0000 mg | ORAL_TABLET | Freq: Once | ORAL | Status: DC | PRN

## 2021-03-29 SURGICAL SUPPLY — 25 items
BLADE SURG 15 STRL LF DISP TIS (BLADE) ×1 IMPLANT
BLADE SURG 15 STRL SS (BLADE) ×2
BNDG COHESIVE 1X5 TAN STRL LF (GAUZE/BANDAGES/DRESSINGS) ×2 IMPLANT
BNDG CONFORM 2 STRL LF (GAUZE/BANDAGES/DRESSINGS) ×2 IMPLANT
COVER BACK TABLE 60X90IN (DRAPES) ×2 IMPLANT
COVER MAYO STAND STRL (DRAPES) ×2 IMPLANT
DRAPE LAPAROTOMY 100X72 PEDS (DRAPES) ×2 IMPLANT
ELECT REM PT RETURN 9FT ADLT (ELECTROSURGICAL) ×2
ELECTRODE REM PT RTRN 9FT ADLT (ELECTROSURGICAL) ×1 IMPLANT
GAUZE 4X4 16PLY ~~LOC~~+RFID DBL (SPONGE) ×2 IMPLANT
GAUZE PETROLATUM 1 X8 (GAUZE/BANDAGES/DRESSINGS) ×1 IMPLANT
GAUZE SPONGE 4X4 12PLY STRL (GAUZE/BANDAGES/DRESSINGS) ×1 IMPLANT
GLOVE SURG ENC MOIS LTX SZ8 (GLOVE) ×2 IMPLANT
GOWN STRL REUS W/TWL LRG LVL3 (GOWN DISPOSABLE) ×2 IMPLANT
KIT TURNOVER CYSTO (KITS) ×2 IMPLANT
NEEDLE HYPO 22GX1.5 SAFETY (NEEDLE) ×2 IMPLANT
NS IRRIG 500ML POUR BTL (IV SOLUTION) ×2 IMPLANT
PACK BASIN DAY SURGERY FS (CUSTOM PROCEDURE TRAY) ×2 IMPLANT
PANTS MESH DISP LRG (UNDERPADS AND DIAPERS) IMPLANT
PANTS MESH DISPOSABLE L (UNDERPADS AND DIAPERS) ×1
PENCIL SMOKE EVACUATOR (MISCELLANEOUS) ×2 IMPLANT
SUT CHROMIC 4 0 RB 1X27 (SUTURE) ×3 IMPLANT
SYR CONTROL 10ML LL (SYRINGE) ×2 IMPLANT
TOWEL OR 17X26 10 PK STRL BLUE (TOWEL DISPOSABLE) ×2 IMPLANT
TRAY DSU PREP LF (CUSTOM PROCEDURE TRAY) ×2 IMPLANT

## 2021-03-29 NOTE — Anesthesia Postprocedure Evaluation (Signed)
Anesthesia Post Note  Patient: Dwayne Nelson  Procedure(s) Performed: DORSAL SLIT CIRCUMCISION ADULT (Penis)     Patient location during evaluation: PACU Anesthesia Type: General Level of consciousness: awake Pain management: pain level controlled Vital Signs Assessment: post-procedure vital signs reviewed and stable Respiratory status: spontaneous breathing and respiratory function stable Cardiovascular status: stable Postop Assessment: no apparent nausea or vomiting Anesthetic complications: no   No notable events documented.  Last Vitals:  Vitals:   03/29/21 1311 03/29/21 1314  BP:    Pulse: 78 77  Resp: 19 13  Temp:  36.6 C  SpO2: 93% 96%    Last Pain:  Vitals:   03/29/21 1314  TempSrc: Oral  PainSc:                  [name]

## 2021-03-29 NOTE — Interval H&P Note (Signed)
History and Physical Interval Note:  03/29/2021 11:19 AM  Dwayne Nelson  has presented today for surgery, with the diagnosis of PHIMOSIS.  The various methods of treatment have been discussed with the patient and family. After consideration of risks, benefits and other options for treatment, the patient has consented to  Procedure(s) with comments: DORSAL SLIT CIRCUMCISION ADULT (N/A) - 30 MINS as a surgical intervention.  The patient's history has been reviewed, patient examined, no change in status, stable for surgery.  I have reviewed the patient's chart and labs.  Questions were answered to the patient's satisfaction.     Bertram Millard Alyn Jurney

## 2021-03-29 NOTE — Anesthesia Preprocedure Evaluation (Addendum)
Anesthesia Evaluation  Patient identified by MRN, date of birth, ID band Patient awake    Reviewed: Allergy & Precautions, NPO status , Patient's Chart, lab work & pertinent test results  Airway Mallampati: II  TM Distance: >3 FB Neck ROM: Full   Comment: Large neck circumference Dental no notable dental hx.    Pulmonary neg pulmonary ROS, Sleep apnea: suggestive features of sleep apnea, no diagnosis. ,    Pulmonary exam normal breath sounds clear to auscultation       Cardiovascular hypertension, Pt. on medications negative cardio ROS Normal cardiovascular exam Rhythm:Regular Rate:Normal     Neuro/Psych negative neurological ROS  negative psych ROS   GI/Hepatic negative GI ROS, Neg liver ROS,   Endo/Other  diabetes, Type 2, Oral Hypoglycemic Agents, Insulin DependentObesity   Renal/GU negative Renal ROS  negative genitourinary   Musculoskeletal negative musculoskeletal ROS (+)   Abdominal   Peds negative pediatric ROS (+)  Hematology negative hematology ROS (+)   Anesthesia Other Findings   Reproductive/Obstetrics negative OB ROS                            Anesthesia Physical Anesthesia Plan  ASA: 3  Anesthesia Plan: General   Post-op Pain Management: Tylenol PO (pre-op)   Induction: Intravenous  PONV Risk Score and Plan: 2 and Treatment may vary due to age or medical condition, Midazolam, Ondansetron and Dexamethasone  Airway Management Planned: LMA  Additional Equipment: None  Intra-op Plan:   Post-operative Plan: Extubation in OR  Informed Consent: I have reviewed the patients History and Physical, chart, labs and discussed the procedure including the risks, benefits and alternatives for the proposed anesthesia with the patient or authorized representative who has indicated his/her understanding and acceptance.       Plan Discussed with: Anesthesiologist, CRNA and  Surgeon  Anesthesia Plan Comments:        Anesthesia Quick Evaluation

## 2021-03-29 NOTE — Anesthesia Procedure Notes (Signed)
Procedure Name: LMA Insertion Date/Time: 03/29/2021 11:17 AM Performed by: Jessica Priest, CRNA Pre-anesthesia Checklist: Patient identified, Emergency Drugs available, Suction available, Patient being monitored and Timeout performed Patient Re-evaluated:Patient Re-evaluated prior to induction Oxygen Delivery Method: Circle system utilized Preoxygenation: Pre-oxygenation with 100% oxygen Induction Type: IV induction Ventilation: Mask ventilation without difficulty LMA: LMA inserted LMA Size: 5.0 Number of attempts: 1 Airway Equipment and Method: Bite block Placement Confirmation: positive ETCO2, breath sounds checked- equal and bilateral and CO2 detector Tube secured with: Tape Dental Injury: Teeth and Oropharynx as per pre-operative assessment

## 2021-03-29 NOTE — Op Note (Signed)
Preoperative diagnosis: Phimosis, buried penis  Postoperative diagnosis: Same  Principal procedure: Dorsal slit circumcision  Surgeon: Jakaiya Netherland  Anesthesia: General with LMA with local  Specimen: None  Complications: None  Estimated blood loss: Less than 5 mL  Indications: 66 year old obese male with a buried penis.  He has significant phimosis.  This makes it difficult for any sexual encounter as well as urination.  He was seen in the office.  I have recommended a circumcision.  However, with the patient's buried penis, it would not be wise to proceed with a sleep circumcision.  I discussed the procedure of dorsal slit with him.  This does not remove the skin but will alleviate the phimosis and allow for sexual activity if desired.  I discussed procedure, risk, complications and expected outcomes with the patient.  He understands these.  He agrees to proceed.  Description of procedure: The patient was properly identified in the holding area, taken to the operating room where general anesthetic was administered with the LMA.  He was in the supine position.  Genitalia and perineum were prepped, draped, proper timeout performed.  10 cc of combination of quarter percent plain Marcaine and 1% plain lidocaine were administered and a dorsal penile block.  I then doubly clamped the dorsal penile skin/foreskin side-by-side with straight hemostats.  In between, incision was made.  The right sided clamp was then removed and a running suture was performed on the incision from distal to proximal at the vertex of the incision.  Similar suture performed on the left side, both using 4-0 chromic.  At this point there was easy access to the patient's glans.  There was no bleeding.  No lesions were seen on the inside of the foreskin.  The procedure was then terminated.  Vaseline gauze was placed over top of the incision, and fluffs were placed over top of this.  Mesh underpants were then placed.  Prior to  completion, another 5 mL of the same local was given along the suture line, 2-1/2 mL on the left, 2-1/2 on the right.  The patient was then awakened, taken to the PACU in stable condition.  He tolerated the procedure well.

## 2021-03-29 NOTE — Transfer of Care (Signed)
Immediate Anesthesia Transfer of Care Note  Patient: Dwayne Nelson  Procedure(s) Performed: Procedure(s) (LRB): DORSAL SLIT CIRCUMCISION ADULT (N/A)  Patient Location: PACU  Anesthesia Type: General  Level of Consciousness: awake, sedated, patient cooperative and responds to stimulation  Airway & Oxygen Therapy: Patient Spontanous Breathing and Patient connected to face mask oxygen  Post-op Assessment: Report given to PACU RN, Post -op Vital signs reviewed and stable and Patient moving all extremities  Post vital signs: Reviewed and stable  Complications: No apparent anesthesia complications

## 2021-03-29 NOTE — Discharge Instructions (Addendum)
Keep the incision covered with gauze for a couple of days.  You can use your normal tight underwear instead of the gauze underwear once you get home.  Place Neosporin along each suture line twice a day for the next 5 days  Call the office for any unusual swelling or bleeding  It is okay to take Advil, Aleve or Tylenol for discomfort in your penile area Post Anesthesia Home Care Instructions  Activity: Get plenty of rest for the remainder of the day. A responsible adult should stay with you for 24 hours following the procedure.  For the next 24 hours, DO NOT: -Drive a car -Advertising copywriter -Drink alcoholic beverages -Take any medication unless instructed by your physician -Make any legal decisions or sign important papers.  Meals: Start with liquid foods such as gelatin or soup. Progress to regular foods as tolerated. Avoid greasy, spicy, heavy foods. If nausea and/or vomiting occur, drink only clear liquids until the nausea and/or vomiting subsides. Call your physician if vomiting continues.  Special Instructions/Symptoms: Your throat may feel dry or sore from the anesthesia or the breathing tube placed in your throat during surgery. If this causes discomfort, gargle with warm salt water. The discomfort should disappear within 24 hours.

## 2021-03-30 ENCOUNTER — Encounter (HOSPITAL_BASED_OUTPATIENT_CLINIC_OR_DEPARTMENT_OTHER): Payer: Self-pay | Admitting: Urology

## 2021-04-03 DIAGNOSIS — E782 Mixed hyperlipidemia: Secondary | ICD-10-CM | POA: Diagnosis not present

## 2021-04-03 DIAGNOSIS — E119 Type 2 diabetes mellitus without complications: Secondary | ICD-10-CM | POA: Diagnosis not present

## 2021-04-03 DIAGNOSIS — I10 Essential (primary) hypertension: Secondary | ICD-10-CM | POA: Diagnosis not present

## 2021-04-03 DIAGNOSIS — E1165 Type 2 diabetes mellitus with hyperglycemia: Secondary | ICD-10-CM | POA: Diagnosis not present

## 2021-04-19 DIAGNOSIS — R3915 Urgency of urination: Secondary | ICD-10-CM | POA: Diagnosis not present

## 2021-04-19 DIAGNOSIS — N4883 Acquired buried penis: Secondary | ICD-10-CM | POA: Diagnosis not present

## 2021-04-19 DIAGNOSIS — N471 Phimosis: Secondary | ICD-10-CM | POA: Diagnosis not present

## 2021-05-01 DIAGNOSIS — E782 Mixed hyperlipidemia: Secondary | ICD-10-CM | POA: Diagnosis not present

## 2021-05-01 DIAGNOSIS — E1165 Type 2 diabetes mellitus with hyperglycemia: Secondary | ICD-10-CM | POA: Diagnosis not present

## 2021-05-01 DIAGNOSIS — I10 Essential (primary) hypertension: Secondary | ICD-10-CM | POA: Diagnosis not present

## 2021-05-29 DIAGNOSIS — I10 Essential (primary) hypertension: Secondary | ICD-10-CM | POA: Diagnosis not present

## 2021-05-29 DIAGNOSIS — E1165 Type 2 diabetes mellitus with hyperglycemia: Secondary | ICD-10-CM | POA: Diagnosis not present

## 2021-05-29 DIAGNOSIS — Z125 Encounter for screening for malignant neoplasm of prostate: Secondary | ICD-10-CM | POA: Diagnosis not present

## 2021-05-29 DIAGNOSIS — E782 Mixed hyperlipidemia: Secondary | ICD-10-CM | POA: Diagnosis not present

## 2021-05-29 DIAGNOSIS — E291 Testicular hypofunction: Secondary | ICD-10-CM | POA: Diagnosis not present

## 2021-07-06 DIAGNOSIS — E782 Mixed hyperlipidemia: Secondary | ICD-10-CM | POA: Diagnosis not present

## 2021-07-06 DIAGNOSIS — E1165 Type 2 diabetes mellitus with hyperglycemia: Secondary | ICD-10-CM | POA: Diagnosis not present

## 2021-07-06 DIAGNOSIS — I10 Essential (primary) hypertension: Secondary | ICD-10-CM | POA: Diagnosis not present

## 2021-09-17 DIAGNOSIS — I10 Essential (primary) hypertension: Secondary | ICD-10-CM | POA: Diagnosis not present

## 2021-09-17 DIAGNOSIS — E782 Mixed hyperlipidemia: Secondary | ICD-10-CM | POA: Diagnosis not present

## 2021-09-17 DIAGNOSIS — E1165 Type 2 diabetes mellitus with hyperglycemia: Secondary | ICD-10-CM | POA: Diagnosis not present

## 2021-09-27 DIAGNOSIS — E1165 Type 2 diabetes mellitus with hyperglycemia: Secondary | ICD-10-CM | POA: Diagnosis not present

## 2021-09-27 DIAGNOSIS — I10 Essential (primary) hypertension: Secondary | ICD-10-CM | POA: Diagnosis not present

## 2021-09-27 DIAGNOSIS — E782 Mixed hyperlipidemia: Secondary | ICD-10-CM | POA: Diagnosis not present

## 2021-10-17 DIAGNOSIS — R3915 Urgency of urination: Secondary | ICD-10-CM | POA: Diagnosis not present

## 2021-10-17 DIAGNOSIS — R350 Frequency of micturition: Secondary | ICD-10-CM | POA: Diagnosis not present

## 2021-10-17 DIAGNOSIS — R351 Nocturia: Secondary | ICD-10-CM | POA: Diagnosis not present

## 2021-10-17 DIAGNOSIS — N5201 Erectile dysfunction due to arterial insufficiency: Secondary | ICD-10-CM | POA: Diagnosis not present

## 2021-10-17 DIAGNOSIS — N4883 Acquired buried penis: Secondary | ICD-10-CM | POA: Diagnosis not present

## 2021-11-21 DIAGNOSIS — E1165 Type 2 diabetes mellitus with hyperglycemia: Secondary | ICD-10-CM | POA: Diagnosis not present

## 2021-11-21 DIAGNOSIS — I10 Essential (primary) hypertension: Secondary | ICD-10-CM | POA: Diagnosis not present

## 2021-11-21 DIAGNOSIS — E782 Mixed hyperlipidemia: Secondary | ICD-10-CM | POA: Diagnosis not present

## 2021-12-20 DIAGNOSIS — E782 Mixed hyperlipidemia: Secondary | ICD-10-CM | POA: Diagnosis not present

## 2021-12-20 DIAGNOSIS — I10 Essential (primary) hypertension: Secondary | ICD-10-CM | POA: Diagnosis not present

## 2021-12-20 DIAGNOSIS — E1165 Type 2 diabetes mellitus with hyperglycemia: Secondary | ICD-10-CM | POA: Diagnosis not present

## 2021-12-24 ENCOUNTER — Telehealth: Payer: Self-pay

## 2021-12-24 NOTE — Patient Outreach (Signed)
  Care Coordination   12/24/2021 Name: Dwayne Nelson MRN: [id number] DOB: 05/09/1955   Care Coordination Outreach Attempts:  An unsuccessful telephone outreach was attempted today to offer the patient information about available care coordination services as a benefit of their health plan.   Follow Up Plan:  Additional outreach attempts will be made to offer the patient care coordination information and services.   Encounter Outcome:  No Answer  Care Coordination Interventions Activated:  No   Care Coordination Interventions:  No, not indicated    Jone Baseman, RN, MSN [facility] Care Management Care Management Coordinator Direct Line [phone]

## 2021-12-28 DIAGNOSIS — E782 Mixed hyperlipidemia: Secondary | ICD-10-CM | POA: Diagnosis not present

## 2021-12-28 DIAGNOSIS — E1165 Type 2 diabetes mellitus with hyperglycemia: Secondary | ICD-10-CM | POA: Diagnosis not present

## 2021-12-28 DIAGNOSIS — I10 Essential (primary) hypertension: Secondary | ICD-10-CM | POA: Diagnosis not present

## 2022-01-07 ENCOUNTER — Telehealth: Payer: Self-pay

## 2022-01-07 NOTE — Patient Outreach (Signed)
  Care Coordination   01/07/2022 Name: Dwayne Nelson MRN: [id number] DOB: September 08, 1955   Care Coordination Outreach Attempts:  A second unsuccessful outreach was attempted today to offer the patient with information about available care coordination services as a benefit of their health plan.     Follow Up Plan:  Additional outreach attempts will be made to offer the patient care coordination information and services.   Encounter Outcome:  No Answer  Care Coordination Interventions Activated:  No   Care Coordination Interventions:  No, not indicated    Jone Baseman, RN, MSN [facility] Care Management Care Management Coordinator Direct Line [phone]

## 2022-01-22 ENCOUNTER — Telehealth: Payer: Self-pay

## 2022-01-22 NOTE — Patient Outreach (Signed)
  Care Coordination   Initial Visit Note   01/22/2022 Name: Dwayne Nelson MRN: [id number] DOB: 29-Mar-1955  Dwayne Nelson is a 66 y.o. year old male who sees Patient, No Pcp Per for primary care. I spoke with  Dwayne Nelson by phone today.  What matters to the patients health and wellness today?  none    Goals Addressed             This Visit's Progress    COMPLETED: Care Coordination Activities-No follow up required       Care Coordination Interventions: Advised patient to Schedule annual wellness visit          SDOH assessments and interventions completed:  Yes     Care Coordination Interventions Activated:  Yes  Care Coordination Interventions:  Yes, provided   Follow up plan: No further intervention required.   Encounter Outcome:  Pt. Visit Completed    Jone Baseman, RN, MSN [place] [facility] Management Care Management Coordinator Direct Line [phone]

## 2022-01-22 NOTE — Patient Instructions (Signed)
Visit Information  Thank you for taking time to visit with me today. Please don't hesitate to contact me if I can be of assistance to you.   Following are the goals we discussed today:   Goals Addressed             This Visit's Progress    COMPLETED: Care Coordination Activities- No follow up required       Care Coordination Interventions: Advised patient to Schedule annual wellness visit           If you are experiencing a Mental Health or Behavioral Health Crisis or need someone to talk to, please call the Suicide and Crisis Lifeline: 988   Patient verbalizes understanding of instructions and care plan provided today and agrees to view in MyChart. Active MyChart status and patient understanding of how to access instructions and care plan via MyChart confirmed with patient.     No further follow up required: patient decline  Junaid Wurzer J Sofiya Ezelle, RN, MSN [facility] Care Management Care Management Coordinator Direct Line [phone]    

## 2022-02-20 DIAGNOSIS — E782 Mixed hyperlipidemia: Secondary | ICD-10-CM | POA: Diagnosis not present

## 2022-02-20 DIAGNOSIS — I10 Essential (primary) hypertension: Secondary | ICD-10-CM | POA: Diagnosis not present

## 2022-02-20 DIAGNOSIS — E1165 Type 2 diabetes mellitus with hyperglycemia: Secondary | ICD-10-CM | POA: Diagnosis not present

## 2022-05-18 ENCOUNTER — Inpatient Hospital Stay (HOSPITAL_COMMUNITY)
Admission: EM | Admit: 2022-05-18 | Discharge: 2022-05-22 | DRG: 552 | Disposition: A | Discharge status: Skilled Nursing Facility | Payer: Medicare Other | Attending: Internal Medicine | Admitting: Internal Medicine

## 2022-05-18 ENCOUNTER — Encounter (HOSPITAL_COMMUNITY): Payer: Self-pay

## 2022-05-18 ENCOUNTER — Other Ambulatory Visit: Payer: Self-pay

## 2022-05-18 ENCOUNTER — Emergency Department (HOSPITAL_COMMUNITY): Payer: Medicare Other

## 2022-05-18 DIAGNOSIS — S32041A Stable burst fracture of fourth lumbar vertebra, initial encounter for closed fracture: Principal | ICD-10-CM | POA: Diagnosis present

## 2022-05-18 DIAGNOSIS — R0902 Hypoxemia: Secondary | ICD-10-CM | POA: Diagnosis not present

## 2022-05-18 DIAGNOSIS — E66812 Obesity, class 2: Secondary | ICD-10-CM | POA: Diagnosis present

## 2022-05-18 DIAGNOSIS — R81 Glycosuria: Secondary | ICD-10-CM | POA: Diagnosis present

## 2022-05-18 DIAGNOSIS — E1165 Type 2 diabetes mellitus with hyperglycemia: Secondary | ICD-10-CM | POA: Diagnosis present

## 2022-05-18 DIAGNOSIS — R739 Hyperglycemia, unspecified: Secondary | ICD-10-CM | POA: Diagnosis not present

## 2022-05-18 DIAGNOSIS — R824 Acetonuria: Secondary | ICD-10-CM | POA: Diagnosis not present

## 2022-05-18 DIAGNOSIS — Z794 Long term (current) use of insulin: Secondary | ICD-10-CM | POA: Diagnosis not present

## 2022-05-18 DIAGNOSIS — M4316 Spondylolisthesis, lumbar region: Secondary | ICD-10-CM | POA: Diagnosis not present

## 2022-05-18 DIAGNOSIS — Z7982 Long term (current) use of aspirin: Secondary | ICD-10-CM

## 2022-05-18 DIAGNOSIS — W010XXA Fall on same level from slipping, tripping and stumbling without subsequent striking against object, initial encounter: Secondary | ICD-10-CM | POA: Diagnosis not present

## 2022-05-18 DIAGNOSIS — R809 Proteinuria, unspecified: Secondary | ICD-10-CM | POA: Diagnosis present

## 2022-05-18 DIAGNOSIS — I10 Essential (primary) hypertension: Secondary | ICD-10-CM | POA: Diagnosis not present

## 2022-05-18 DIAGNOSIS — S32049A Unspecified fracture of fourth lumbar vertebra, initial encounter for closed fracture: Secondary | ICD-10-CM | POA: Diagnosis not present

## 2022-05-18 DIAGNOSIS — E669 Obesity, unspecified: Secondary | ICD-10-CM | POA: Diagnosis present

## 2022-05-18 DIAGNOSIS — Z7984 Long term (current) use of oral hypoglycemic drugs: Secondary | ICD-10-CM | POA: Diagnosis not present

## 2022-05-18 DIAGNOSIS — Z6839 Body mass index (BMI) 39.0-39.9, adult: Secondary | ICD-10-CM | POA: Diagnosis not present

## 2022-05-18 DIAGNOSIS — E785 Hyperlipidemia, unspecified: Secondary | ICD-10-CM | POA: Diagnosis not present

## 2022-05-18 DIAGNOSIS — M25552 Pain in left hip: Secondary | ICD-10-CM | POA: Diagnosis not present

## 2022-05-18 DIAGNOSIS — R109 Unspecified abdominal pain: Secondary | ICD-10-CM | POA: Diagnosis not present

## 2022-05-18 DIAGNOSIS — Z79899 Other long term (current) drug therapy: Secondary | ICD-10-CM

## 2022-05-18 DIAGNOSIS — Z6841 Body Mass Index (BMI) 40.0 and over, adult: Secondary | ICD-10-CM | POA: Insufficient documentation

## 2022-05-18 DIAGNOSIS — R197 Diarrhea, unspecified: Secondary | ICD-10-CM | POA: Diagnosis not present

## 2022-05-18 DIAGNOSIS — S32040A Wedge compression fracture of fourth lumbar vertebra, initial encounter for closed fracture: Secondary | ICD-10-CM | POA: Diagnosis present

## 2022-05-18 DIAGNOSIS — R0781 Pleurodynia: Secondary | ICD-10-CM | POA: Diagnosis not present

## 2022-05-18 DIAGNOSIS — M47816 Spondylosis without myelopathy or radiculopathy, lumbar region: Secondary | ICD-10-CM | POA: Diagnosis not present

## 2022-05-18 LAB — TYPE AND SCREEN
ABO/RH(D): O POS
Antibody Screen: NEGATIVE

## 2022-05-18 LAB — CBC WITH DIFFERENTIAL/PLATELET
Abs Immature Granulocytes: 0.03 10*3/uL (ref 0.00–0.07)
Basophils Absolute: 0 10*3/uL (ref 0.0–0.1)
Basophils Relative: 1 %
Eosinophils Absolute: 0 10*3/uL (ref 0.0–0.5)
Eosinophils Relative: 1 %
HCT: 42.5 % (ref 39.0–52.0)
Hemoglobin: 13.7 g/dL (ref 13.0–17.0)
Immature Granulocytes: 1 %
Lymphocytes Relative: 16 %
Lymphs Abs: 1 10*3/uL (ref 0.7–4.0)
MCH: 30.5 pg (ref 26.0–34.0)
MCHC: 32.2 g/dL (ref 30.0–36.0)
MCV: 94.7 fL (ref 80.0–100.0)
Monocytes Absolute: 0.5 10*3/uL (ref 0.1–1.0)
Monocytes Relative: 8 %
Neutro Abs: 4.6 10*3/uL (ref 1.7–7.7)
Neutrophils Relative %: 73 %
Platelets: 184 10*3/uL (ref 150–400)
RBC: 4.49 MIL/uL (ref 4.22–5.81)
RDW: 13.1 % (ref 11.5–15.5)
WBC: 6.1 10*3/uL (ref 4.0–10.5)
nRBC: 0 % (ref 0.0–0.2)

## 2022-05-18 LAB — URINALYSIS, ROUTINE W REFLEX MICROSCOPIC
Bacteria, UA: NONE SEEN
Bilirubin Urine: NEGATIVE
Glucose, UA: 150 mg/dL — AB
Hgb urine dipstick: NEGATIVE
Ketones, ur: 80 mg/dL — AB
Leukocytes,Ua: NEGATIVE
Nitrite: NEGATIVE
Protein, ur: 100 mg/dL — AB
Specific Gravity, Urine: >1.046 — ABNORMAL HIGH (ref 1.005–1.030)
pH: 5 (ref 5.0–8.0)

## 2022-05-18 LAB — HEPATIC FUNCTION PANEL
ALT: 22 U/L (ref 0–44)
AST: 22 U/L (ref 15–41)
Albumin: 3.3 g/dL — ABNORMAL LOW (ref 3.5–5.0)
Alkaline Phosphatase: 78 U/L (ref 38–126)
Bilirubin, Direct: 0.1 mg/dL (ref 0.0–0.2)
Indirect Bilirubin: 0.7 mg/dL (ref 0.3–0.9)
Total Bilirubin: 0.8 mg/dL (ref 0.3–1.2)
Total Protein: 6.5 g/dL (ref 6.5–8.1)

## 2022-05-18 LAB — I-STAT CHEM 8, ED
BUN: 16 mg/dL (ref 8–23)
Calcium, Ion: 1.09 mmol/L — ABNORMAL LOW (ref 1.15–1.40)
Chloride: 105 mmol/L (ref 98–111)
Creatinine, Ser: 0.6 mg/dL — ABNORMAL LOW (ref 0.61–1.24)
Glucose, Bld: 247 mg/dL — ABNORMAL HIGH (ref 70–99)
HCT: 41 % (ref 39.0–52.0)
Hemoglobin: 13.9 g/dL (ref 13.0–17.0)
Potassium: 4.2 mmol/L (ref 3.5–5.1)
Sodium: 140 mmol/L (ref 135–145)
TCO2: 24 mmol/L (ref 22–32)

## 2022-05-18 LAB — BASIC METABOLIC PANEL
Anion gap: 9 (ref 5–15)
BUN: 14 mg/dL (ref 8–23)
CO2: 23 mmol/L (ref 22–32)
Calcium: 8.2 mg/dL — ABNORMAL LOW (ref 8.9–10.3)
Chloride: 107 mmol/L (ref 98–111)
Creatinine, Ser: 0.76 mg/dL (ref 0.61–1.24)
GFR, Estimated: >60 mL/min (ref >60)
Glucose, Bld: 210 mg/dL — ABNORMAL HIGH (ref 70–99)
Potassium: 4.1 mmol/L (ref 3.5–5.1)
Sodium: 139 mmol/L (ref 135–145)

## 2022-05-18 LAB — HEMOGLOBIN A1C
Hgb A1c MFr Bld: 7.9 % — ABNORMAL HIGH (ref 4.8–5.6)
Mean Plasma Glucose: 180.03 mg/dL

## 2022-05-18 LAB — CBG MONITORING, ED: Glucose-Capillary: 206 mg/dL — ABNORMAL HIGH (ref 70–99)

## 2022-05-18 LAB — ABO/RH: ABO/RH(D): O POS

## 2022-05-18 LAB — GLUCOSE, CAPILLARY: Glucose-Capillary: 231 mg/dL — ABNORMAL HIGH (ref 70–99)

## 2022-05-18 MED ORDER — OXYCODONE HCL 5 MG PO TABS
5.0000 mg | ORAL_TABLET | ORAL | Status: DC | PRN
  Administered 2022-05-19 – 2022-05-20 (×3): 5 mg via ORAL
  Filled 2022-05-18 (×3): qty 1

## 2022-05-18 MED ORDER — ATORVASTATIN CALCIUM 10 MG PO TABS
20.0000 mg | ORAL_TABLET | Freq: Every day | ORAL | Status: DC
  Administered 2022-05-18 – 2022-05-22 (×5): 20 mg via ORAL
  Filled 2022-05-18 (×5): qty 2

## 2022-05-18 MED ORDER — IOHEXOL 300 MG/ML  SOLN
100.0000 mL | Freq: Once | INTRAMUSCULAR | Status: AC | PRN
  Administered 2022-05-18: 100 mL via INTRAVENOUS

## 2022-05-18 MED ORDER — MORPHINE SULFATE (PF) 4 MG/ML IV SOLN
4.0000 mg | INTRAVENOUS | Status: DC | PRN
Start: 2022-05-18 — End: 2022-05-23
  Administered 2022-05-18: 4 mg via INTRAVENOUS
  Filled 2022-05-18: qty 1

## 2022-05-18 MED ORDER — METOPROLOL SUCCINATE ER 25 MG PO TB24
50.0000 mg | ORAL_TABLET | Freq: Every day | ORAL | Status: DC
  Administered 2022-05-18 – 2022-05-21 (×4): 50 mg via ORAL
  Filled 2022-05-18 (×4): qty 2

## 2022-05-18 MED ORDER — ONDANSETRON HCL 4 MG/2ML IJ SOLN
4.0000 mg | Freq: Four times a day (QID) | INTRAMUSCULAR | Status: DC | PRN
  Administered 2022-05-18: 4 mg via INTRAVENOUS
  Filled 2022-05-18: qty 2

## 2022-05-18 MED ORDER — ACETAMINOPHEN 650 MG RE SUPP: 650.0000 mg | Freq: Four times a day (QID) | RECTAL | Status: DC | PRN

## 2022-05-18 MED ORDER — EMPAGLIFLOZIN 25 MG PO TABS
25.0000 mg | ORAL_TABLET | Freq: Every day | ORAL | Status: DC
  Administered 2022-05-18 – 2022-05-22 (×5): 25 mg via ORAL
  Filled 2022-05-18 (×5): qty 1

## 2022-05-18 MED ORDER — KETOROLAC TROMETHAMINE 15 MG/ML IJ SOLN
15.0000 mg | Freq: Four times a day (QID) | INTRAMUSCULAR | Status: AC
  Administered 2022-05-18 – 2022-05-19 (×3): 15 mg via INTRAVENOUS
  Filled 2022-05-18 (×3): qty 1

## 2022-05-18 MED ORDER — PIOGLITAZONE HCL 30 MG PO TABS
30.0000 mg | ORAL_TABLET | Freq: Every day | ORAL | Status: DC
  Administered 2022-05-18 – 2022-05-21 (×4): 30 mg via ORAL
  Filled 2022-05-18 (×5): qty 1

## 2022-05-18 MED ORDER — ASPIRIN 81 MG PO TBEC
81.0000 mg | DELAYED_RELEASE_TABLET | Freq: Every day | ORAL | Status: DC
  Administered 2022-05-18 – 2022-05-22 (×5): 81 mg via ORAL
  Filled 2022-05-18 (×5): qty 1

## 2022-05-18 MED ORDER — BENAZEPRIL HCL 20 MG PO TABS
20.0000 mg | ORAL_TABLET | Freq: Every day | ORAL | Status: DC
  Administered 2022-05-18 – 2022-05-22 (×5): 20 mg via ORAL
  Filled 2022-05-18 (×5): qty 1

## 2022-05-18 MED ORDER — ACETAMINOPHEN 325 MG PO TABS: 650.0000 mg | ORAL_TABLET | Freq: Four times a day (QID) | ORAL | Status: DC | PRN

## 2022-05-18 MED ORDER — INSULIN GLARGINE-LIXISENATIDE 100-33 UNT-MCG/ML ~~LOC~~ SOPN: 30.0000 [IU] | PEN_INJECTOR | Freq: Two times a day (BID) | SUBCUTANEOUS | Status: DC

## 2022-05-18 MED ORDER — INSULIN GLARGINE-YFGN 100 UNIT/ML ~~LOC~~ SOLN
30.0000 [IU] | Freq: Two times a day (BID) | SUBCUTANEOUS | Status: DC
  Administered 2022-05-18 – 2022-05-22 (×7): 30 [IU] via SUBCUTANEOUS
  Filled 2022-05-18 (×9): qty 0.3

## 2022-05-18 MED ORDER — ONDANSETRON HCL 4 MG PO TABS: 4.0000 mg | ORAL_TABLET | Freq: Four times a day (QID) | ORAL | Status: DC | PRN

## 2022-05-18 MED ORDER — HYDROCODONE-ACETAMINOPHEN 5-325 MG PO TABS
1.0000 | ORAL_TABLET | Freq: Once | ORAL | Status: AC
  Administered 2022-05-18: 1 via ORAL
  Filled 2022-05-18: qty 1

## 2022-05-18 MED ORDER — INSULIN ASPART 100 UNIT/ML IJ SOLN
0.0000 [IU] | Freq: Three times a day (TID) | INTRAMUSCULAR | Status: DC
  Administered 2022-05-18: 7 [IU] via SUBCUTANEOUS
  Administered 2022-05-19 (×2): 4 [IU] via SUBCUTANEOUS
  Administered 2022-05-19: 3 [IU] via SUBCUTANEOUS
  Administered 2022-05-20: 4 [IU] via SUBCUTANEOUS
  Administered 2022-05-20 – 2022-05-22 (×4): 3 [IU] via SUBCUTANEOUS
  Administered 2022-05-22: 7 [IU] via SUBCUTANEOUS
  Filled 2022-05-18: qty 0.2

## 2022-05-18 MED ORDER — METFORMIN HCL ER 500 MG PO TB24: 1000.0000 mg | ORAL_TABLET | Freq: Two times a day (BID) | ORAL | Status: DC

## 2022-05-18 MED ORDER — ENOXAPARIN SODIUM 40 MG/0.4ML IJ SOSY
40.0000 mg | PREFILLED_SYRINGE | INTRAMUSCULAR | Status: DC
  Administered 2022-05-18 – 2022-05-21 (×4): 40 mg via SUBCUTANEOUS
  Filled 2022-05-18 (×4): qty 0.4

## 2022-05-18 MED ORDER — AMLODIPINE BESYLATE 5 MG PO TABS
5.0000 mg | ORAL_TABLET | Freq: Every day | ORAL | Status: DC
  Administered 2022-05-18 – 2022-05-22 (×5): 5 mg via ORAL
  Filled 2022-05-18 (×5): qty 1

## 2022-05-18 MED ORDER — HYDROCHLOROTHIAZIDE 25 MG PO TABS
25.0000 mg | ORAL_TABLET | Freq: Every day | ORAL | Status: DC
  Administered 2022-05-19: 25 mg via ORAL
  Filled 2022-05-18: qty 1

## 2022-05-18 NOTE — ED Notes (Signed)
Called report to [place] at [facility].

## 2022-05-18 NOTE — ED Notes (Signed)
Ortho tech is in the room to place brace.

## 2022-05-18 NOTE — ED Notes (Signed)
Spoke with Lauren in Ortho and she said that she has senn the order and will contact Dr. Wyvonnia Dusky about specifics

## 2022-05-18 NOTE — ED Triage Notes (Addendum)
Pt arrived via EMS, from home, mechanical fall x2 days ago. C/o left sided lower back pain and right sided rib pain.   Given 100 mcg fentanyl en route 18 L hand  Placed on 2L [place] in triage, pt 88% RA maintained

## 2022-05-18 NOTE — Progress Notes (Signed)
Orthopedic Tech Progress Note Patient Details:  [name] 07/10/1955 [id number]  Stat order for LSO brace called into [facility] as we are out of stock of this specific brace here. I spoke with Dr. Wyvonnia Dusky on the phone and we agreed an LSO would be more compatible and effective for this pt due to body habitus and not having any t-spine fxs.  Patient ID: [name], male   DOB: 12/11/1955, 67 y.o.   MRN: [id number]  Carin Primrose 05/18/2022, 3:01 PM

## 2022-05-18 NOTE — ED Notes (Signed)
Pt aware of UA sample. Urinal at bedside

## 2022-05-18 NOTE — H&P (Signed)
History and Physical    Patient: Dwayne Nelson [id number] DOB: Jul 02, 1955 DOA: 05/18/2022 DOS: the patient was seen and examined on 05/18/2022 PCP: Shirline Frees, MD  Patient coming from: Home  Chief Complaint:  Chief Complaint  Patient presents with   Back Pain   HPI: Dwayne Nelson is a 67 y.o. male with medical history significant of class II obesity, type 2 diabetes, hyperlipidemia, hypertension, who had a mechanical fall 2 days ago while he was walking to a [employer] convenience store developing progressively worse lower back pain.  He received 100 mcg of fentanyl by EMS.  No fecal or urinary incontinence.  No lower extremity weakness, numbness or tingling. He denied fever, chills, rhinorrhea, sore throat, wheezing or hemoptysis.  No chest pain, palpitations, diaphoresis, PND, orthopnea or pitting edema of the lower extremities.  No abdominal pain, nausea, emesis, diarrhea, constipation, melena or hematochezia.  No flank pain, dysuria, frequency or hematuria.  No polyuria, polydipsia, polyphagia or blurred vision.  He has not been able to take his medications since Thursday due to severely decreased mobility.  Lab work: His CBC was normal.  Urine analysis showed increase a specific gravity more than 1.046, glucosuria of 150, ketonuria of 80 and proteinuria 100 mg/dL.  BMP showed a glucose of 210 and calcium of 8.2 mg/dL.  Renal function and the rest of the electrolytes were normal.  Imaging: CT of the lumbar spine showed an acute superior endplate compression fracture of the L4 vertebral body with up to 45% loss of height centrally.  Mild retropulsion of the superior endplate.  Nondisplaced fractures of the bilateral L4 pedicles.  There is antegrade 1 anterolisthesis of L4 on L5.  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  Aortic atherosclerosis.  CT abdomen/pelvis with no evidence of the subtle organ injury or hemoperitoneum.  There is colonic diverticulosis.  Tiny umbilical and  small bilateral inguinal hernias which only contain fat.  Right rib series was negative for fracture.   ED course: Initial vital signs were temperature 98.7 F, pulse 76, respiration 18, BP 147/75 mmHg O2 sat 96% on nasal cannula oxygen.  He is most recent O2 sat was 95% on room air.  He received hydrocodone 5/acetaminophen 325 mg in the emergency department.  Neurosurgery was consulted and asked for the patient to be admitted to [facility] for further evaluation/treatment.  Review of Systems: As mentioned in the history of present illness. All other systems reviewed and are negative.  Past Medical History:  Diagnosis Date   Diabetes mellitus without complication ([place])    Hyperlipidemia    Hypertension    Past Surgical History:  Procedure Laterality Date   CIRCUMCISION N/A 03/29/2021   Procedure: DORSAL SLIT CIRCUMCISION ADULT;  Surgeon: Franchot Gallo, MD;  Location: [facility];  Service: Urology;  Laterality: N/A;   colonoscopy N/A    NO PAST SURGERIES     Social History:  reports that he has never smoked. He has never used smokeless tobacco. He reports that he does not currently use alcohol. He reports that he does not use drugs.  No Known Allergies  Family History  Problem Relation Age of Onset   Bladder Cancer Father     Prior to Admission medications   Medication Sig Start Date End Date Taking? Authorizing Provider  amLODipine-benazepril (LOTREL) 5-20 MG capsule Take 1 capsule by mouth at bedtime.    [provider]  aspirin 81 MG chewable tablet Chew 81 mg by mouth daily.  [provider]  atorvastatin (LIPITOR) 20 MG tablet Take 20 mg by mouth daily.    [provider]  cetirizine (ZYRTEC) 10 MG tablet Take 10 mg by mouth daily.    [provider]  empagliflozin (JARDIANCE) 25 MG TABS tablet Take 25 mg by mouth daily.    [provider]  hydrochlorothiazide (HYDRODIURIL) 25 MG tablet Take 25 mg by  mouth daily.    [provider]  Insulin Glargine-Lixisenatide (SOLIQUA) 100-33 UNT-MCG/ML SOPN Inject 30 Units into the skin in the morning and at bedtime.    [provider]  metFORMIN (GLUCOPHAGE) 500 MG tablet Take 500 mg by mouth 2 (two) times daily with a meal.    [provider]  metoprolol succinate (TOPROL-XL) 50 MG 24 hr tablet Take 50 mg by mouth at bedtime. Take with or immediately following a meal.    [provider]  Multiple Vitamins-Minerals (CENTRUM SILVER ULTRA MENS PO) Take by mouth.    [provider]  pioglitazone (ACTOS) 15 MG tablet Take 15 mg by mouth daily.    [provider]    Physical Exam: Vitals:   05/18/22 1200 05/18/22 1300 05/18/22 1629  BP: (!) 147/75 (!) 166/143 (!) 148/89  Pulse: 76 73 74  Resp: '18 18 20  '$ Temp:   98.7 F (37.1 C)  TempSrc:   Oral  SpO2: 96% 96% 95%   Physical Exam Vitals and nursing note reviewed.  Constitutional:      General: He is awake. He is not in acute distress.    Appearance: Normal appearance. He is obese.  HENT:     Head: Normocephalic.     Mouth/Throat:     Mouth: Mucous membranes are moist.  Eyes:     General: No scleral icterus.    Pupils: Pupils are equal, round, and reactive to light.  Neck:     Vascular: No JVD.  Cardiovascular:     Rate and Rhythm: Normal rate and regular rhythm.     Heart sounds: S1 normal and S2 normal.  Pulmonary:     Effort: Pulmonary effort is normal.     Breath sounds: Normal breath sounds.  Abdominal:     General: Bowel sounds are normal. There is no distension.     Palpations: Abdomen is soft.     Tenderness: There is no abdominal tenderness. There is no guarding.  Musculoskeletal:     Cervical back: Neck supple.     Lumbar back: Tenderness present. Decreased range of motion.     Right lower leg: No edema.     Left lower leg: No edema.  Skin:    General: Skin is warm and dry.  Neurological:     General: No focal deficit  present.     Mental Status: He is alert and oriented to person, place, and time.  Psychiatric:        Mood and Affect: Mood normal.        Behavior: Behavior normal. Behavior is cooperative.     Data Reviewed:  Results are pending, will review when available.  Assessment and Plan: Principal Problem:   Closed compression fracture of L4 lumbar vertebra, initial encounter ([place]) Observation/MedSurg. Frequent neurochecks. Fall precautions. Toradol 15 mg IVP x 3. Oxycodone 5 mg every 4 hours as needed. Morphine 4 mg IVP every 4 hours as needed. Neurosurgery to evaluate at [facility].  Active Problems:   Type 2 diabetes mellitus with hyperglycemia (HCC)  Carbohydrate modified diet. Check hemoglobin  A1c. Continue Actos 30 mg at bedtime. Continue metformin XR 1000 mg p.o. twice daily. Continue insulin glargine 30 units twice daily. CBG monitoring before meals and bedtime. Regular insulin sliding scale before meals    Essential hypertension Resume Lotrel 5/20 mg p.o. bedtime. Resume metoprolol 50 mg p.o. at bedtime. Resume hydrochlorothiazide 25 mg p.o. daily.    Hyperlipidemia Resume atorvastatin 20 mg p.o. daily.    Class 2 obesity Current BMI 39.54 kg/m. Lifestyle modifications. Follow-up with primary care provider.    Hypocalcemia Recheck calcium level with albumin in AM. Will proceed with further workup depending on results.     Advance Care Planning:   Code Status: Full Code   Consults: Neurosurgery will see at [facility].  Family Communication:   Severity of Illness: The appropriate patient status for this patient is OBSERVATION. Observation status is judged to be reasonable and necessary in order to provide the required intensity of service to ensure the patient's safety. The patient's presenting symptoms, physical exam findings, and initial radiographic and laboratory data in the context of their medical condition is felt to place them at decreased risk for further  clinical deterioration. Furthermore, it is anticipated that the patient will be medically stable for discharge from the hospital within 2 midnights of admission.   Author: Reubin Milan, MD 05/18/2022 4:50 PM  For on call review [email or web address].   This document was prepared using Dragon voice recognition software and may contain some unintended transcription errors.

## 2022-05-18 NOTE — ED Provider Notes (Signed)
[facility] Provider Note   CSN: [id number] Arrival date & time: 05/18/22  1141     History  Chief Complaint  Patient presents with   Back Pain    Dwayne Nelson is a 67 y.o. male.  Patient with history of diabetes and hypertension here with left-sided back pain after mechanical fall 2 days ago.  States he was at [facility] when he tripped and lost his balance and fell against a trash can.  The trash can moved and he fell onto his left buttock and somehow injured his right lower ribs as well.  Denies hitting head or losing consciousness.  Complains of pain to his right anterior ribs, left low back.  No head, neck, chest or abdominal pain.  No vomiting.  No blood thinner use.  States he been taking Tylenol at home without relief.  Comes in today because the pain has gotten progressively worse to the point he is difficulty walking around his house.  There is no associated weakness, numbness or tingling.  No bowel or bladder incontinence.  No fever or vomiting.  No history of previous back problems.  The history is provided by the patient and the EMS personnel.  Back Pain Associated symptoms: no abdominal pain, no dysuria, no fever, no headaches and no weakness        Home Medications Prior to Admission medications   Medication Sig Start Date End Date Taking? Authorizing Provider  amLODipine-benazepril (LOTREL) 5-20 MG capsule Take 1 capsule by mouth at bedtime.    [provider]  aspirin 81 MG chewable tablet Chew 81 mg by mouth daily.    [provider]  atorvastatin (LIPITOR) 20 MG tablet Take 20 mg by mouth daily.    [provider]  cetirizine (ZYRTEC) 10 MG tablet Take 10 mg by mouth daily.    [provider]  empagliflozin (JARDIANCE) 25 MG TABS tablet Take 25 mg by mouth daily.    [provider]  hydrochlorothiazide (HYDRODIURIL) 25 MG tablet Take 25 mg by mouth daily.    [provider]  Insulin Glargine-Lixisenatide (SOLIQUA) 100-33 UNT-MCG/ML SOPN Inject 30 Units into the skin in the morning and at bedtime.    [provider]  metFORMIN (GLUCOPHAGE) 500 MG tablet Take 500 mg by mouth 2 (two) times daily with a meal.    [provider]  metoprolol succinate (TOPROL-XL) 50 MG 24 hr tablet Take 50 mg by mouth at bedtime. Take with or immediately following a meal.    [provider]  Multiple Vitamins-Minerals (CENTRUM SILVER ULTRA MENS PO) Take by mouth.    [provider]  pioglitazone (ACTOS) 15 MG tablet Take 15 mg by mouth daily.    [provider]      Allergies    Patient has no known allergies.    Review of Systems   Review of Systems  Constitutional:  Negative for activity change, appetite change and fever.  HENT:  Negative for congestion and rhinorrhea.   Respiratory:  Negative for cough, chest tightness and shortness of breath.   Gastrointestinal:  Negative for abdominal pain, diarrhea, nausea and vomiting.  Genitourinary:  Negative for dysuria.  Musculoskeletal:  Positive for arthralgias, back pain and myalgias.  Skin:  Negative for rash.  Neurological:  Negative for dizziness, weakness and headaches.   all other systems are negative except as noted in the HPI and PMH.    Physical Exam Updated Vital Signs  BP (!) 147/75 (BP Location: Right Arm)   Pulse 76   Resp 18   SpO2 96%  Physical Exam Vitals and nursing note reviewed.  Constitutional:      General: He is not in acute distress.    Appearance: He is well-developed.  HENT:     Head: Normocephalic and atraumatic.     Mouth/Throat:     Pharynx: No oropharyngeal exudate.  Eyes:     Conjunctiva/sclera: Conjunctivae normal.     Pupils: Pupils are equal, round, and reactive to light.  Neck:     Comments: No meningismus. Cardiovascular:     Rate and Rhythm: Normal rate and regular rhythm.     Heart sounds: Normal heart sounds. No murmur  heard. Pulmonary:     Effort: Pulmonary effort is normal. No respiratory distress.     Breath sounds: Normal breath sounds.     Comments: Right anterior rib tenderness, no crepitus or ecchymosis Chest:     Chest wall: Tenderness present.  Abdominal:     Palpations: Abdomen is soft.     Tenderness: There is no abdominal tenderness. There is no guarding or rebound.  Musculoskeletal:        General: Tenderness present. Normal range of motion.     Cervical back: Normal range of motion and neck supple.     Comments: Left paraspinal lumbar tenderness, no midline tenderness.  Range of motion bilateral hips without pain  5/5 strength in bilateral lower extremities. Ankle plantar and dorsiflexion intact. Great toe extension intact bilaterally. +2 DP and PT pulses.   Skin:    General: Skin is warm.  Neurological:     Mental Status: He is alert and oriented to person, place, and time.     Cranial Nerves: No cranial nerve deficit.     Motor: No abnormal muscle tone.     Coordination: Coordination normal.     Comments:  5/5 strength throughout. CN 2-12 intact.Equal grip strength.   Psychiatric:        Behavior: Behavior normal.     ED Results / Procedures / Treatments   Labs (all labs ordered are listed, but only abnormal results are displayed) Labs Reviewed  I-STAT CHEM 8, ED - Abnormal; Notable for the following components:      Result Value   Creatinine, Ser 0.60 (*)    Glucose, Bld 247 (*)    Calcium, Ion 1.09 (*)    All other components within normal limits  URINALYSIS, ROUTINE W REFLEX MICROSCOPIC    EKG EKG Interpretation  Date/Time:  Saturday May 18 2022 12:20:10 EST Ventricular Rate:  71 PR Interval:  162 QRS Duration: 150 QT Interval:  451 QTC Calculation: 491 R Axis:   -65 Text Interpretation: Sinus rhythm RBBB and LAFB Baseline wander in lead(s) V3 No significant change was found Confirmed by Ezequiel Essex [phone]) on 05/18/2022 1:49:23 PM  Radiology DG  Ribs Unilateral W/Chest Right  Result Date: 05/18/2022 CLINICAL DATA:  Fall, rib pain EXAM: RIGHT RIBS AND CHEST - 3+ VIEW COMPARISON:  None Available. FINDINGS: No fracture or other bone lesions are seen involving the ribs. There is no evidence of pneumothorax or pleural effusion. Both lungs are clear. Heart size and mediastinal contours are within normal limits. IMPRESSION: Negative. Electronically Signed   By: Davina Poke D.O.   On: 05/18/2022 14:48   CT Lumbar Spine Wo Contrast  Result Date: 05/18/2022 CLINICAL DATA:  Back trauma, no prior imaging (Age >= 16y) EXAM: CT LUMBAR SPINE  WITHOUT CONTRAST TECHNIQUE: Multidetector CT imaging of the lumbar spine was performed without intravenous contrast administration. Multiplanar CT image reconstructions were also generated. RADIATION DOSE REDUCTION: This exam was performed according to the departmental dose-optimization program which includes automated exposure control, adjustment of the mA and/or kV according to patient size and/or use of iterative reconstruction technique. COMPARISON:  Same day abdominopelvic CT FINDINGS: Segmentation: 5 lumbar type vertebrae. Alignment: Grade 1 anterolisthesis of L4 on L5. Vertebrae: Acute superior endplate compression fracture of the L4 vertebral body with up to 45% vertebral body height loss centrally. Mild retropulsion at the superior endplate. Nondisplaced fractures of the bilateral L4 pedicles. Remaining lumbar vertebral body heights are maintained without additional fracture. Facet joints are intact without dislocation. Bridging endplate osteophytes spanning from T9 to L1. Paraspinal and other soft tissues: Aortic atherosclerosis. See dedicated CT abdomen and pelvis for full characterization of the intra-abdominal findings. Disc levels: Moderate lower lumbar facet arthropathy. Intervertebral disc heights are relatively well preserved. Disc uncovering at L4-L5. Left greater than right foraminal stenosis at L4-L5.  Epidural lipomatosis is present from approximately L2 to S1. IMPRESSION: 1. Acute superior endplate compression fracture of the L4 vertebral body with up to 45% vertebral body height loss centrally. Mild retropulsion at the superior endplate. 2. Nondisplaced fractures of the bilateral L4 pedicles. 3. Degenerative grade 1 anterolisthesis of L4 on L5. 4. 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 Aortic Atherosclerosis (ICD10-I70.0). Electronically Signed   By: Davina Poke D.O.   On: 05/18/2022 14:34   CT ABDOMEN PELVIS W CONTRAST  Result Date: 05/18/2022 CLINICAL DATA:  Fall 2 days ago with blunt abdominal trauma. Right-sided abdominal pain. EXAM: CT ABDOMEN AND PELVIS WITH CONTRAST TECHNIQUE: Multidetector CT imaging of the abdomen and pelvis was performed using the standard protocol following bolus administration of intravenous contrast. RADIATION DOSE REDUCTION: This exam was performed according to the departmental dose-optimization program which includes automated exposure control, adjustment of the mA and/or kV according to patient size and/or use of iterative reconstruction technique. CONTRAST:  181m OMNIPAQUE IOHEXOL 300 MG/ML  SOLN COMPARISON:  None Available. FINDINGS: Lower chest:  Unremarkable. Hepatobiliary: No hepatic laceration or mass identified. Gallbladder is unremarkable. No evidence of biliary ductal dilatation. Pancreas: No parenchymal laceration, mass, or inflammatory changes identified. Spleen: No evidence of splenic laceration. Adrenal/Urinary Tract: No hemorrhage or parenchymal lacerations identified. No evidence of suspicious renal masses or hydronephrosis. Stomach/Bowel: Unopacified bowel loops are unremarkable in appearance. No evidence of hemoperitoneum. Normal appendix visualized. Diverticulosis is seen mainly involving the descending and sigmoid colon, however there is no evidence of diverticulitis. Vascular/Lymphatic: No evidence of abdominal aortic injury. Aortic  atherosclerotic calcification incidentally noted. No pathologically enlarged lymph nodes identified. Reproductive:  No mass or other significant abnormality identified. Other: Tiny umbilical hernia, which contains only fat. Small bilateral fat-containing inguinal hernia is also noted. Musculoskeletal: Acute compression fracture of the L4 vertebral body is seen. No evidence of spondylolisthesis. IMPRESSION: Acute compression fracture of L4 vertebral body. No evidence of spondylolisthesis. No evidence of visceral i organ njury or hemoperitoneum. Colonic diverticulosis, without radiographic evidence of diverticulitis. Tiny umbilical and small bilateral inguinal hernias, which contain only fat. Electronically Signed   By: JMarlaine HindM.D.   On: 05/18/2022 14:24    Procedures Procedures    Medications Ordered in ED Medications  HYDROcodone-acetaminophen (NORCO/VICODIN) 5-325 MG per tablet 1 tablet (has no administration in time range)    ED Course/ Medical Decision Making/ A&P  Medical Decision Making Amount and/or Complexity of Data Reviewed Labs: ordered. Decision-making details documented in ED Course. Radiology: ordered and independent interpretation performed. Decision-making details documented in ED Course. ECG/medicine tests: ordered and independent interpretation performed. Decision-making details documented in ED Course.  Risk Prescription drug management.  Left low back pain and right anterior rib pain after fall 2 days ago.  No head injury.  Vital stable, no distress.  Neurovascular intact. Intact strength, sensation, pulses bilaterally.  Low suspicion for cord compression or cauda equina.  Right rib x-ray does not show any rib fracture or pneumothorax.  Results reviewed interpreted by me.  CT of lumbar spine shows acute L4 compression fracture with 45% retropulsion of fractures of the L4 pedicles. Some anterolisthesis.  No intra-abdominal free.  No rib  fracture.   Discussed with Dr. Arnoldo Morale of neurosurgery.  He reviewed patient's CT scan and is concerned about potential instability.  Recommends LSO brace and admission to the hospital service at [facility].  He will see patient to give further recommendations for pain control, therapies and standing x-rays.  Discussed with patient and son who are agreeable. He declines additional pain medication at this time.        Final Clinical Impression(s) / ED Diagnoses Final diagnoses:  None    Rx / DC Orders ED Discharge Orders     None         Arielys Wandersee, Annie Main, MD 05/18/22 [phone]

## 2022-05-18 NOTE — Consult Note (Signed)
Reason for Consult: L4 burst fracture Referring Physician: Dr. Wilhemina Bonito is an 67 y.o. male.  HPI: The patient is an obese 67 year old white male non-smoker who fell walking into the [employer] on 05/16/2022.  Send the patient has severe low back pain.  He has been ambulating short distances at home.  He came to the [facility], ER today and was worked up with a CT scan which demonstrated an L4 burst fracture.  He has been admitted by Dr. Olevia Bowens.  And neurosurgical consultation was requested.  Presently the patient is alert and pleasant.  He complains of left greater than right low back pain at the lumbosacral junction.  He is not having any radicular symptoms, numbness, tingling, weakness, etc.  Past Medical History:  Diagnosis Date   Diabetes mellitus without complication ([place])    Hyperlipidemia    Hypertension     Past Surgical History:  Procedure Laterality Date   CIRCUMCISION N/A 03/29/2021   Procedure: DORSAL SLIT CIRCUMCISION ADULT;  Surgeon: Franchot Gallo, MD;  Location: [facility];  Service: Urology;  Laterality: N/A;   colonoscopy N/A    NO PAST SURGERIES      Family History  Problem Relation Age of Onset   Bladder Cancer Father     Social History:  reports that he has never smoked. He has never used smokeless tobacco. He reports that he does not currently use alcohol. He reports that he does not use drugs.  Allergies: No Known Allergies  Medications: I have reviewed the patient's current medications. Prior to Admission:  Medications Prior to Admission  Medication Sig Dispense Refill Last Dose   amLODipine-benazepril (LOTREL) 5-20 MG capsule Take 1 capsule by mouth at bedtime.      aspirin 81 MG chewable tablet Chew 81 mg by mouth daily.      atorvastatin (LIPITOR) 20 MG tablet Take 20 mg by mouth daily.      hydrochlorothiazide (HYDRODIURIL) 25 MG tablet Take 25 mg by mouth daily.      metFORMIN (GLUCOPHAGE-XR) 500 MG 24 hr tablet Take  1,000 mg by mouth 2 (two) times daily with a meal.      metoprolol succinate (TOPROL-XL) 50 MG 24 hr tablet Take 50 mg by mouth at bedtime. Take with or immediately following a meal.      pioglitazone (ACTOS) 30 MG tablet Take 30 mg by mouth daily.      cetirizine (ZYRTEC) 10 MG tablet Take 10 mg by mouth daily.      empagliflozin (JARDIANCE) 25 MG TABS tablet Take 25 mg by mouth daily.      Insulin Glargine-Lixisenatide (SOLIQUA) 100-33 UNT-MCG/ML SOPN Inject 30 Units into the skin in the morning and at bedtime.      Multiple Vitamins-Minerals (CENTRUM SILVER ULTRA MENS PO) Take by mouth.      Scheduled:  enoxaparin (LOVENOX) injection  40 mg Subcutaneous Q24H   insulin aspart  0-20 Units Subcutaneous TID WC   ketorolac  15 mg Intravenous Q6H   Continuous: [id number] **OR** acetaminophen, morphine injection, ondansetron **OR** ondansetron (ZOFRAN) IV, oxyCODONE Anti-infectives (From admission, onward)    None        Results for orders placed or performed during the hospital encounter of 05/18/22 (from the past 48 hour(s))  I-stat chem 8, ED (not at [facility], [facility] or [place])     Status: Abnormal   Collection Time: 05/18/22 12:18 PM  Result Value Ref Range   Sodium 140 135 - 145 mmol/L  Potassium 4.2 3.5 - 5.1 mmol/L   Chloride 105 98 - 111 mmol/L   BUN 16 8 - 23 mg/dL   Creatinine, Ser 0.60 (L) 0.61 - 1.24 mg/dL   Glucose, Bld 247 (H) 70 - 99 mg/dL    Comment: Glucose reference range applies only to samples taken after fasting for at least 8 hours.   Calcium, Ion 1.09 (L) 1.15 - 1.40 mmol/L   TCO2 24 22 - 32 mmol/L   Hemoglobin 13.9 13.0 - 17.0 g/dL   HCT 41.0 39.0 - 52.0 %  CBC with Differential     Status: None   Collection Time: 05/18/22  3:38 PM  Result Value Ref Range   WBC 6.1 4.0 - 10.5 K/uL   RBC 4.49 4.22 - 5.81 MIL/uL   Hemoglobin 13.7 13.0 - 17.0 g/dL   HCT 42.5 39.0 - 52.0 %   MCV 94.7 80.0 - 100.0 fL   MCH 30.5 26.0 - 34.0 pg   MCHC 32.2 30.0 - 36.0 g/dL    RDW 13.1 11.5 - 15.5 %   Platelets 184 150 - 400 K/uL   nRBC 0.0 0.0 - 0.2 %   Neutrophils Relative % 73 %   Neutro Abs 4.6 1.7 - 7.7 K/uL   Lymphocytes Relative 16 %   Lymphs Abs 1.0 0.7 - 4.0 K/uL   Monocytes Relative 8 %   Monocytes Absolute 0.5 0.1 - 1.0 K/uL   Eosinophils Relative 1 %   Eosinophils Absolute 0.0 0.0 - 0.5 K/uL   Basophils Relative 1 %   Basophils Absolute 0.0 0.0 - 0.1 K/uL   Immature Granulocytes 1 %   Abs Immature Granulocytes 0.03 0.00 - 0.07 K/uL    Comment: Performed at [facility], [place] [address], [place], [place] [id number]  Basic metabolic panel     Status: Abnormal   Collection Time: 05/18/22  3:38 PM  Result Value Ref Range   Sodium 139 135 - 145 mmol/L   Potassium 4.1 3.5 - 5.1 mmol/L   Chloride 107 98 - 111 mmol/L   CO2 23 22 - 32 mmol/L   Glucose, Bld 210 (H) 70 - 99 mg/dL    Comment: Glucose reference range applies only to samples taken after fasting for at least 8 hours.   BUN 14 8 - 23 mg/dL   Creatinine, Ser 0.76 0.61 - 1.24 mg/dL   Calcium 8.2 (L) 8.9 - 10.3 mg/dL   GFR, Estimated >60 >60 mL/min    Comment: (NOTE) Calculated using the CKD-EPI Creatinine Equation (2021)    Anion gap 9 5 - 15    Comment: Performed at [facility], [place] [address] [place], [place] [address]  ABO/Rh     Status: None   Collection Time: 05/18/22  3:38 PM  Result Value Ref Range   ABO/RH(D)      O POS Performed at [facility], [place] [address], [place], [place] [address]   Type and screen [place]     Status: None   Collection Time: 05/18/22  4:09 PM  Result Value Ref Range   ABO/RH(D) O POS    Antibody Screen NEG    Sample Expiration      05/21/2022,2359 Performed at [facility], [place] [address] [place], [place] [address]   Urinalysis, Routine w reflex microscopic -Urine, Clean Catch     Status: Abnormal   Collection Time: 05/18/22  4:14 PM  Result  Value Ref Range  Color, Urine YELLOW YELLOW   APPearance CLEAR CLEAR   Specific Gravity, Urine >1.046 (H) 1.005 - 1.030   pH 5.0 5.0 - 8.0   Glucose, UA 150 (A) NEGATIVE mg/dL   Hgb urine dipstick NEGATIVE NEGATIVE   Bilirubin Urine NEGATIVE NEGATIVE   Ketones, ur 80 (A) NEGATIVE mg/dL   Protein, ur 100 (A) NEGATIVE mg/dL   Nitrite NEGATIVE NEGATIVE   Leukocytes,Ua NEGATIVE NEGATIVE   RBC / HPF 0-5 0 - 5 RBC/hpf   WBC, UA 0-5 0 - 5 WBC/hpf   Bacteria, UA NONE SEEN NONE SEEN   Squamous Epithelial / HPF 0-5 0 - 5 /HPF   Mucus PRESENT     Comment: Performed at [facility], [place] [address], [place], [place] [address]  CBG monitoring, ED     Status: Abnormal   Collection Time: 05/18/22  5:16 PM  Result Value Ref Range   Glucose-Capillary 206 (H) 70 - 99 mg/dL    Comment: Glucose reference range applies only to samples taken after fasting for at least 8 hours.    DG Ribs Unilateral W/Chest Right  Result Date: 05/18/2022 CLINICAL DATA:  Fall, rib pain EXAM: RIGHT RIBS AND CHEST - 3+ VIEW COMPARISON:  None Available. FINDINGS: No fracture or other bone lesions are seen involving the ribs. There is no evidence of pneumothorax or pleural effusion. Both lungs are clear. Heart size and mediastinal contours are within normal limits. IMPRESSION: Negative. Electronically Signed   By: Davina Poke D.O.   On: 05/18/2022 14:48   CT Lumbar Spine Wo Contrast  Result Date: 05/18/2022 CLINICAL DATA:  Back trauma, no prior imaging (Age >= 16y) EXAM: CT LUMBAR SPINE WITHOUT CONTRAST TECHNIQUE: Multidetector CT imaging of the lumbar spine was performed without intravenous contrast administration. Multiplanar CT image reconstructions were also generated. RADIATION DOSE REDUCTION: This exam was performed according to the departmental dose-optimization program which includes automated exposure control, adjustment of the mA and/or kV according to patient size and/or use of iterative  reconstruction technique. COMPARISON:  Same day abdominopelvic CT FINDINGS: Segmentation: 5 lumbar type vertebrae. Alignment: Grade 1 anterolisthesis of L4 on L5. Vertebrae: Acute superior endplate compression fracture of the L4 vertebral body with up to 45% vertebral body height loss centrally. Mild retropulsion at the superior endplate. Nondisplaced fractures of the bilateral L4 pedicles. Remaining lumbar vertebral body heights are maintained without additional fracture. Facet joints are intact without dislocation. Bridging endplate osteophytes spanning from T9 to L1. Paraspinal and other soft tissues: Aortic atherosclerosis. See dedicated CT abdomen and pelvis for full characterization of the intra-abdominal findings. Disc levels: Moderate lower lumbar facet arthropathy. Intervertebral disc heights are relatively well preserved. Disc uncovering at L4-L5. Left greater than right foraminal stenosis at L4-L5. Epidural lipomatosis is present from approximately L2 to S1. IMPRESSION: 1. Acute superior endplate compression fracture of the L4 vertebral body with up to 45% vertebral body height loss centrally. Mild retropulsion at the superior endplate. 2. Nondisplaced fractures of the bilateral L4 pedicles. 3. Degenerative grade 1 anterolisthesis of L4 on L5. 4. 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 Aortic Atherosclerosis (ICD10-I70.0). Electronically Signed   By: Davina Poke D.O.   On: 05/18/2022 14:34   CT ABDOMEN PELVIS W CONTRAST  Result Date: 05/18/2022 CLINICAL DATA:  Fall 2 days ago with blunt abdominal trauma. Right-sided abdominal pain. EXAM: CT ABDOMEN AND PELVIS WITH CONTRAST TECHNIQUE: Multidetector CT imaging of the abdomen and pelvis was performed using the standard protocol following bolus administration of  intravenous contrast. RADIATION DOSE REDUCTION: This exam was performed according to the departmental dose-optimization program which includes automated exposure control, adjustment  of the mA and/or kV according to patient size and/or use of iterative reconstruction technique. CONTRAST:  143m OMNIPAQUE IOHEXOL 300 MG/ML  SOLN COMPARISON:  None Available. FINDINGS: Lower chest:  Unremarkable. Hepatobiliary: No hepatic laceration or mass identified. Gallbladder is unremarkable. No evidence of biliary ductal dilatation. Pancreas: No parenchymal laceration, mass, or inflammatory changes identified. Spleen: No evidence of splenic laceration. Adrenal/Urinary Tract: No hemorrhage or parenchymal lacerations identified. No evidence of suspicious renal masses or hydronephrosis. Stomach/Bowel: Unopacified bowel loops are unremarkable in appearance. No evidence of hemoperitoneum. Normal appendix visualized. Diverticulosis is seen mainly involving the descending and sigmoid colon, however there is no evidence of diverticulitis. Vascular/Lymphatic: No evidence of abdominal aortic injury. Aortic atherosclerotic calcification incidentally noted. No pathologically enlarged lymph nodes identified. Reproductive:  No mass or other significant abnormality identified. Other: Tiny umbilical hernia, which contains only fat. Small bilateral fat-containing inguinal hernia is also noted. Musculoskeletal: Acute compression fracture of the L4 vertebral body is seen. No evidence of spondylolisthesis. IMPRESSION: Acute compression fracture of L4 vertebral body. No evidence of spondylolisthesis. No evidence of visceral i organ njury or hemoperitoneum. Colonic diverticulosis, without radiographic evidence of diverticulitis. Tiny umbilical and small bilateral inguinal hernias, which contain only fat. Electronically Signed   By: JMarlaine HindM.D.   On: 05/18/2022 14:24    ROS: As above Blood pressure (!) 148/89, pulse 74, temperature 98.7 F (37.1 C), temperature source Oral, resp. rate 20, height '5\' 10"'$  (1.778 m), weight 125 kg, SpO2 95 %. Estimated body mass index is 39.54 kg/m as calculated from the following:    Height as of this encounter: '5\' 10"'$  (1.778 m).   Weight as of this encounter: 125 kg.  Physical Exam  General: An alert and pleasant 67year old obese white male in bed with a lumbosacral corset in place.  HEENT: Normocephalic, atraumatic, extraocular muscles are intact  Neck: Unremarkable.  Normal range of motion.  Spurling's testing is negative.  Thorax: Symmetric  Abdomen: Obese  Extremities: Unremarkable  Neurologic exam: The patient is alert and oriented x 3.  Glasgow Coma Scale 15.  Cranial nerves II through XII are examined bilaterally grossly normal.  Vision and hearing are grossly normal bile bilaterally.  The patient's motor strength is 5/5 in his bilateral bicep, tricep, handgrip, gastrocnemius and dorsiflexors.  Cerebellar functions intact to rapid alternating movements of the upper extremity bilaterally.  Sensory function is intact to light touch sensation in all tested dermatomes bilaterally.  I have reviewed the patient's lumbar CT.  He has a L4 burst fracture which extends into the bilateral pedicles with a mild spondylolisthesis.  Assessment/Plan: L4 burst fracture, L4-5 spondylolisthesis: I have discussed the situation with the patient.  I have told him he has a significant fracture.  What concerns me is his pedicle fractures and spondylolisthesis.  He may have a chronic degenerative spinal listhesis versus an acute traumatic spondylolisthesis.  I have told him that this fracture may well heal in a lumbosacral corset.  I would recommend keeping him at bedrest until tomorrow when we get standing x-rays.  If the standing x-rays demonstrate significant subluxation, he may need surgery.  I have answered all his questions.  JOphelia Charter2/24/2024, 6:17 PM

## 2022-05-18 NOTE — ED Provider Notes (Signed)
Hospitalist service  Olevia Bowens) aware of case and will evaluate for admission.   Valarie Merino, MD 05/18/22 [phone]

## 2022-05-18 NOTE — Progress Notes (Signed)
On bedrest. Has fracture L4.

## 2022-05-18 NOTE — ED Notes (Signed)
[facility] states that a truck will be here with in the next 30 minutes.

## 2022-05-19 ENCOUNTER — Observation Stay (HOSPITAL_COMMUNITY): Payer: Medicare Other

## 2022-05-19 DIAGNOSIS — E1165 Type 2 diabetes mellitus with hyperglycemia: Secondary | ICD-10-CM | POA: Diagnosis present

## 2022-05-19 DIAGNOSIS — R809 Proteinuria, unspecified: Secondary | ICD-10-CM | POA: Diagnosis present

## 2022-05-19 DIAGNOSIS — E669 Obesity, unspecified: Secondary | ICD-10-CM | POA: Diagnosis not present

## 2022-05-19 DIAGNOSIS — R531 Weakness: Secondary | ICD-10-CM | POA: Diagnosis not present

## 2022-05-19 DIAGNOSIS — Z7982 Long term (current) use of aspirin: Secondary | ICD-10-CM | POA: Diagnosis not present

## 2022-05-19 DIAGNOSIS — R824 Acetonuria: Secondary | ICD-10-CM | POA: Diagnosis present

## 2022-05-19 DIAGNOSIS — S32041A Stable burst fracture of fourth lumbar vertebra, initial encounter for closed fracture: Secondary | ICD-10-CM | POA: Diagnosis present

## 2022-05-19 DIAGNOSIS — S32040A Wedge compression fracture of fourth lumbar vertebra, initial encounter for closed fracture: Secondary | ICD-10-CM | POA: Diagnosis not present

## 2022-05-19 DIAGNOSIS — Z79899 Other long term (current) drug therapy: Secondary | ICD-10-CM | POA: Diagnosis not present

## 2022-05-19 DIAGNOSIS — R197 Diarrhea, unspecified: Secondary | ICD-10-CM | POA: Diagnosis not present

## 2022-05-19 DIAGNOSIS — I10 Essential (primary) hypertension: Secondary | ICD-10-CM | POA: Diagnosis present

## 2022-05-19 DIAGNOSIS — W010XXA Fall on same level from slipping, tripping and stumbling without subsequent striking against object, initial encounter: Secondary | ICD-10-CM | POA: Diagnosis present

## 2022-05-19 DIAGNOSIS — Z794 Long term (current) use of insulin: Secondary | ICD-10-CM | POA: Diagnosis not present

## 2022-05-19 DIAGNOSIS — E785 Hyperlipidemia, unspecified: Secondary | ICD-10-CM | POA: Diagnosis present

## 2022-05-19 DIAGNOSIS — W19XXXA Unspecified fall, initial encounter: Secondary | ICD-10-CM | POA: Diagnosis not present

## 2022-05-19 DIAGNOSIS — M4316 Spondylolisthesis, lumbar region: Secondary | ICD-10-CM | POA: Diagnosis not present

## 2022-05-19 DIAGNOSIS — R81 Glycosuria: Secondary | ICD-10-CM | POA: Diagnosis present

## 2022-05-19 DIAGNOSIS — Z7984 Long term (current) use of oral hypoglycemic drugs: Secondary | ICD-10-CM | POA: Diagnosis not present

## 2022-05-19 DIAGNOSIS — S32042A Unstable burst fracture of fourth lumbar vertebra, initial encounter for closed fracture: Secondary | ICD-10-CM | POA: Diagnosis not present

## 2022-05-19 DIAGNOSIS — Z9181 History of falling: Secondary | ICD-10-CM | POA: Diagnosis not present

## 2022-05-19 DIAGNOSIS — S32049A Unspecified fracture of fourth lumbar vertebra, initial encounter for closed fracture: Secondary | ICD-10-CM | POA: Diagnosis not present

## 2022-05-19 DIAGNOSIS — M545 Low back pain, unspecified: Secondary | ICD-10-CM | POA: Diagnosis not present

## 2022-05-19 DIAGNOSIS — Z7401 Bed confinement status: Secondary | ICD-10-CM | POA: Diagnosis not present

## 2022-05-19 DIAGNOSIS — Z7409 Other reduced mobility: Secondary | ICD-10-CM | POA: Diagnosis not present

## 2022-05-19 DIAGNOSIS — Z6839 Body mass index (BMI) 39.0-39.9, adult: Secondary | ICD-10-CM | POA: Diagnosis not present

## 2022-05-19 DIAGNOSIS — Z4789 Encounter for other orthopedic aftercare: Secondary | ICD-10-CM | POA: Diagnosis not present

## 2022-05-19 DIAGNOSIS — S32040D Wedge compression fracture of fourth lumbar vertebra, subsequent encounter for fracture with routine healing: Secondary | ICD-10-CM | POA: Diagnosis not present

## 2022-05-19 LAB — BASIC METABOLIC PANEL
Anion gap: 15 (ref 5–15)
BUN: 23 mg/dL (ref 8–23)
CO2: 20 mmol/L — ABNORMAL LOW (ref 22–32)
Calcium: 8.8 mg/dL — ABNORMAL LOW (ref 8.9–10.3)
Chloride: 106 mmol/L (ref 98–111)
Creatinine, Ser: 1.05 mg/dL (ref 0.61–1.24)
GFR, Estimated: >60 mL/min (ref >60)
Glucose, Bld: 136 mg/dL — ABNORMAL HIGH (ref 70–99)
Potassium: 3.9 mmol/L (ref 3.5–5.1)
Sodium: 141 mmol/L (ref 135–145)

## 2022-05-19 LAB — GLUCOSE, CAPILLARY
Glucose-Capillary: 154 mg/dL — ABNORMAL HIGH (ref 70–99)
Glucose-Capillary: 174 mg/dL — ABNORMAL HIGH (ref 70–99)
Glucose-Capillary: 147 mg/dL — ABNORMAL HIGH (ref 70–99)
Glucose-Capillary: 215 mg/dL — ABNORMAL HIGH (ref 70–99)

## 2022-05-19 NOTE — Progress Notes (Signed)
PROGRESS NOTE    Dwayne Nelson  [id number] DOB: [birth year] DOA: 05/18/2022 PCP: Shirline Frees, MD   Brief Narrative: 67 year old with past medical history significant for class II obesity, diabetes type 2, hyperlipidemia, hypertension, who had a mechanical fall 2 days prior to admission, presented with progressive worsening lower back pain.  He was found to have acute superior endplate compression fracture of L4 vertebral body with up to 45% loss of height centrally.  Mild retropulsion of the superior endplate.  Nondisplaced fracture of the bilateral L4 pedicles. There is antegrade 1 anterolisthesis of L4 on L5. 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   Admitted for pain control and further evaluation by neurosurgery.    Assessment & Plan:   Principal Problem:   Closed compression fracture of L4 lumbar vertebra, initial encounter [facility]) Active Problems:   Essential hypertension   Hyperlipidemia   Class 2 obesity   Hypocalcemia   Type 2 diabetes mellitus with hyperglycemia (HCC)   1-Close compression fracture of L4 vertebra Continue with pain management  Neurosurgery consulted and is recommending standing  x-ray today If X ray negative for changes L 4-5 spondylolisthesis patient can be discharge and FU with neurosurgery in 4-6 weeks.  He will need PT, OT eval.  Needs to wear Lumbosacral corset.   2-Diarrhea; develops diarrhea this am. So far 2 watery stool.  Plan to check for C diff.   3-Diabetes type 2 with hyperglycemia Metformin while inpatient Ac;7.9 Continue gargling, Actos, Jardiance.  Sliding scale insulin  Hypertension: Continue with metoprolol, Lotrel.  May need to hold hydrochlorothiazide due to diarrhea  Hyperlipidemia: Continue with atorvastatin  Class  2 obesity: Need lifestyle modification  Hypocalcemia: mild.          Estimated body mass index is 39.54 kg/m as calculated from the following:   Height as of this encounter: '5\' 10"'$   (1.778 m).   Weight as of this encounter: 125 kg.   DVT prophylaxis: SCD, Lovenox Code Status: Full Code Family Communication: Care discussed with patient Disposition Plan:  Status is: Observation The patient remains OBS appropriate and will d/c before 2 midnights.    Consultants:  Neurosurgery, Dr Arnoldo Morale  Procedures:  None  Antimicrobials:    Subjective: He report diarrhea, started after eating breakfast, report stomach upset.  Report back pain. He has been bed rest.   Objective: Vitals:   05/18/22 1821 05/18/22 2218 05/19/22 0503 05/19/22 0841  BP: 137/76 128/72 (!) 163/84 136/83  Pulse: 79 78 67 60  Resp: '18 18 17 17  '$ Temp: 98.3 F (36.8 C) 98.7 F (37.1 C) 98.3 F (36.8 C) 97.6 F (36.4 C)  TempSrc: Oral Oral Oral Oral  SpO2: 94% 93% 93% 93%  Weight:      Height:        Intake/Output Summary (Last 24 hours) at 05/19/2022 1317 Last data filed at 05/19/2022 1044 Gross per 24 hour  Intake 220 ml  Output 1100 ml  Net -880 ml   Filed Weights   05/18/22 1651  Weight: 125 kg    Examination:  General exam: Appears calm and comfortable  Respiratory system: Clear to auscultation. Respiratory effort normal. Cardiovascular system: S1 & S2 heard, RRR.  Gastrointestinal system: Abdomen is nondistended, soft and nontender. No organomegaly or masses felt. Normal bowel sounds heard. Central nervous system: Alert and oriented.  Extremities: Symmetric 5 x 5 power.   Data Reviewed: I have personally reviewed following labs and imaging studies  CBC: Recent Labs  Lab  05/18/22 1218 05/18/22 1538  WBC  --  6.1  NEUTROABS  --  4.6  HGB 13.9 13.7  HCT 41.0 42.5  MCV  --  94.7  PLT  --  [id number]   Basic Metabolic Panel: Recent Labs  Lab 05/18/22 1218 05/18/22 1538  NA 140 139  K 4.2 4.1  CL 105 107  CO2  --  23  GLUCOSE 247* 210*  BUN 16 14  CREATININE 0.60* 0.76  CALCIUM  --  8.2*   GFR: Estimated Creatinine Clearance: 117.3 mL/min (by C-G formula  based on SCr of 0.76 mg/dL). Liver Function Tests: Recent Labs  Lab 05/18/22 1830  AST 22  ALT 22  ALKPHOS 78  BILITOT 0.8  PROT 6.5  ALBUMIN 3.3*   No results for input(s): "LIPASE", "AMYLASE" in the last 168 hours. No results for input(s): "AMMONIA" in the last 168 hours. Coagulation Profile: No results for input(s): "INR", "PROTIME" in the last 168 hours. Cardiac Enzymes: No results for input(s): "CKTOTAL", "CKMB", "CKMBINDEX", "TROPONINI" in the last 168 hours. BNP (last 3 results) No results for input(s): "PROBNP" in the last 8760 hours. HbA1C: Recent Labs    05/18/22 1830  HGBA1C 7.9*   CBG: Recent Labs  Lab 05/18/22 1716 05/18/22 2215 05/19/22 0842 05/19/22 1248  GLUCAP 206* 231* 154* 174*   Lipid Profile: No results for input(s): "CHOL", "HDL", "LDLCALC", "TRIG", "CHOLHDL", "LDLDIRECT" in the last 72 hours. Thyroid Function Tests: No results for input(s): "TSH", "T4TOTAL", "FREET4", "T3FREE", "THYROIDAB" in the last 72 hours. Anemia Panel: No results for input(s): "VITAMINB12", "FOLATE", "FERRITIN", "TIBC", "IRON", "RETICCTPCT" in the last 72 hours. Sepsis Labs: No results for input(s): "PROCALCITON", "LATICACIDVEN" in the last 168 hours.  No results found for this or any previous visit (from the past 240 hour(s)).       Radiology Studies: DG Ribs Unilateral W/Chest Right  Result Date: 05/18/2022 CLINICAL DATA:  Fall, rib pain EXAM: RIGHT RIBS AND CHEST - 3+ VIEW COMPARISON:  None Available. FINDINGS: No fracture or other bone lesions are seen involving the ribs. There is no evidence of pneumothorax or pleural effusion. Both lungs are clear. Heart size and mediastinal contours are within normal limits. IMPRESSION: Negative. Electronically Signed   By: Davina Poke D.O.   On: 05/18/2022 14:48   CT Lumbar Spine Wo Contrast  Result Date: 05/18/2022 CLINICAL DATA:  Back trauma, no prior imaging (Age >= 16y) EXAM: CT LUMBAR SPINE WITHOUT CONTRAST  TECHNIQUE: Multidetector CT imaging of the lumbar spine was performed without intravenous contrast administration. Multiplanar CT image reconstructions were also generated. RADIATION DOSE REDUCTION: This exam was performed according to the departmental dose-optimization program which includes automated exposure control, adjustment of the mA and/or kV according to patient size and/or use of iterative reconstruction technique. COMPARISON:  Same day abdominopelvic CT FINDINGS: Segmentation: 5 lumbar type vertebrae. Alignment: Grade 1 anterolisthesis of L4 on L5. Vertebrae: Acute superior endplate compression fracture of the L4 vertebral body with up to 45% vertebral body height loss centrally. Mild retropulsion at the superior endplate. Nondisplaced fractures of the bilateral L4 pedicles. Remaining lumbar vertebral body heights are maintained without additional fracture. Facet joints are intact without dislocation. Bridging endplate osteophytes spanning from T9 to L1. Paraspinal and other soft tissues: Aortic atherosclerosis. See dedicated CT abdomen and pelvis for full characterization of the intra-abdominal findings. Disc levels: Moderate lower lumbar facet arthropathy. Intervertebral disc heights are relatively well preserved. Disc uncovering at L4-L5. Left greater than right foraminal stenosis at L4-L5.  Epidural lipomatosis is present from approximately L2 to S1. IMPRESSION: 1. Acute superior endplate compression fracture of the L4 vertebral body with up to 45% vertebral body height loss centrally. Mild retropulsion at the superior endplate. 2. Nondisplaced fractures of the bilateral L4 pedicles. 3. Degenerative grade 1 anterolisthesis of L4 on L5. 4. 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 Aortic Atherosclerosis (ICD10-I70.0). Electronically Signed   By: Davina Poke D.O.   On: 05/18/2022 14:34   CT ABDOMEN PELVIS W CONTRAST  Result Date: 05/18/2022 CLINICAL DATA:  Fall 2 days ago with blunt  abdominal trauma. Right-sided abdominal pain. EXAM: CT ABDOMEN AND PELVIS WITH CONTRAST TECHNIQUE: Multidetector CT imaging of the abdomen and pelvis was performed using the standard protocol following bolus administration of intravenous contrast. RADIATION DOSE REDUCTION: This exam was performed according to the departmental dose-optimization program which includes automated exposure control, adjustment of the mA and/or kV according to patient size and/or use of iterative reconstruction technique. CONTRAST:  146m OMNIPAQUE IOHEXOL 300 MG/ML  SOLN COMPARISON:  None Available. FINDINGS: Lower chest:  Unremarkable. Hepatobiliary: No hepatic laceration or mass identified. Gallbladder is unremarkable. No evidence of biliary ductal dilatation. Pancreas: No parenchymal laceration, mass, or inflammatory changes identified. Spleen: No evidence of splenic laceration. Adrenal/Urinary Tract: No hemorrhage or parenchymal lacerations identified. No evidence of suspicious renal masses or hydronephrosis. Stomach/Bowel: Unopacified bowel loops are unremarkable in appearance. No evidence of hemoperitoneum. Normal appendix visualized. Diverticulosis is seen mainly involving the descending and sigmoid colon, however there is no evidence of diverticulitis. Vascular/Lymphatic: No evidence of abdominal aortic injury. Aortic atherosclerotic calcification incidentally noted. No pathologically enlarged lymph nodes identified. Reproductive:  No mass or other significant abnormality identified. Other: Tiny umbilical hernia, which contains only fat. Small bilateral fat-containing inguinal hernia is also noted. Musculoskeletal: Acute compression fracture of the L4 vertebral body is seen. No evidence of spondylolisthesis. IMPRESSION: Acute compression fracture of L4 vertebral body. No evidence of spondylolisthesis. No evidence of visceral i organ njury or hemoperitoneum. Colonic diverticulosis, without radiographic evidence of diverticulitis.  Tiny umbilical and small bilateral inguinal hernias, which contain only fat. Electronically Signed   By: JMarlaine HindM.D.   On: 05/18/2022 14:24        Scheduled Meds:  amLODipine  5 mg Oral Daily   And   benazepril  20 mg Oral Daily   aspirin EC  81 mg Oral Daily   atorvastatin  20 mg Oral Daily   empagliflozin  25 mg Oral Daily   enoxaparin (LOVENOX) injection  40 mg Subcutaneous Q24H   hydrochlorothiazide  25 mg Oral Daily   insulin aspart  0-20 Units Subcutaneous TID WC   insulin glargine-yfgn  30 Units Subcutaneous BID   metoprolol succinate  50 mg Oral QHS   pioglitazone  30 mg Oral QHS   Continuous Infusions:   LOS: 0 days    Time spent: 35 minutes    Varina Hulon A Kitai Purdom, MD [facility]   If 7PM-7AM, please contact night-coverage [email or web address]  05/19/2022, 1:17 PM

## 2022-05-19 NOTE — Progress Notes (Signed)
Subjective: The patient is alert and pleasant.  His son is at the bedside.  Objective: Vital signs in last 24 hours: Temp:  [97.6 F (36.4 C)-98.7 F (37.1 C)] 97.6 F (36.4 C) (02/25 0841) Pulse Rate:  [60-79] 60 (02/25 0841) Resp:  [17-20] 17 (02/25 0841) BP: (128-166)/(72-143) 136/83 (02/25 0841) SpO2:  [93 %-96 %] 93 % (02/25 0841) Weight:  [125 kg] 125 kg (02/24 1651) Estimated body mass index is 39.54 kg/m as calculated from the following:   Height as of this encounter: '5\' 10"'$  (1.778 m).   Weight as of this encounter: 125 kg.   Intake/Output from previous day: 02/24 0701 - 02/25 0700 In: -  Out: 600 [Urine:600] Intake/Output this shift: No intake/output data recorded.  Physical exam the patient is alert and pleasant.  His lower extremity strength is normal.  Lab Results: Recent Labs    05/18/22 1218 05/18/22 1538  WBC  --  6.1  HGB 13.9 13.7  HCT 41.0 42.5  PLT  --  184   BMET Recent Labs    05/18/22 1218 05/18/22 1538  NA 140 139  K 4.2 4.1  CL 105 107  CO2  --  23  GLUCOSE 247* 210*  BUN 16 14  CREATININE 0.60* 0.76  CALCIUM  --  8.2*    Studies/Results: DG Ribs Unilateral W/Chest Right  Result Date: 05/18/2022 CLINICAL DATA:  Fall, rib pain EXAM: RIGHT RIBS AND CHEST - 3+ VIEW COMPARISON:  None Available. FINDINGS: No fracture or other bone lesions are seen involving the ribs. There is no evidence of pneumothorax or pleural effusion. Both lungs are clear. Heart size and mediastinal contours are within normal limits. IMPRESSION: Negative. Electronically Signed   By: Davina Poke D.O.   On: 05/18/2022 14:48   CT Lumbar Spine Wo Contrast  Result Date: 05/18/2022 CLINICAL DATA:  Back trauma, no prior imaging (Age >= 16y) EXAM: CT LUMBAR SPINE WITHOUT CONTRAST TECHNIQUE: Multidetector CT imaging of the lumbar spine was performed without intravenous contrast administration. Multiplanar CT image reconstructions were also generated. RADIATION DOSE  REDUCTION: This exam was performed according to the departmental dose-optimization program which includes automated exposure control, adjustment of the mA and/or kV according to patient size and/or use of iterative reconstruction technique. COMPARISON:  Same day abdominopelvic CT FINDINGS: Segmentation: 5 lumbar type vertebrae. Alignment: Grade 1 anterolisthesis of L4 on L5. Vertebrae: Acute superior endplate compression fracture of the L4 vertebral body with up to 45% vertebral body height loss centrally. Mild retropulsion at the superior endplate. Nondisplaced fractures of the bilateral L4 pedicles. Remaining lumbar vertebral body heights are maintained without additional fracture. Facet joints are intact without dislocation. Bridging endplate osteophytes spanning from T9 to L1. Paraspinal and other soft tissues: Aortic atherosclerosis. See dedicated CT abdomen and pelvis for full characterization of the intra-abdominal findings. Disc levels: Moderate lower lumbar facet arthropathy. Intervertebral disc heights are relatively well preserved. Disc uncovering at L4-L5. Left greater than right foraminal stenosis at L4-L5. Epidural lipomatosis is present from approximately L2 to S1. IMPRESSION: 1. Acute superior endplate compression fracture of the L4 vertebral body with up to 45% vertebral body height loss centrally. Mild retropulsion at the superior endplate. 2. Nondisplaced fractures of the bilateral L4 pedicles. 3. Degenerative grade 1 anterolisthesis of L4 on L5. 4. 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 Aortic Atherosclerosis (ICD10-I70.0). Electronically Signed   By: Davina Poke D.O.   On: 05/18/2022 14:34   CT ABDOMEN PELVIS W CONTRAST  Result Date:  05/18/2022 CLINICAL DATA:  Fall 2 days ago with blunt abdominal trauma. Right-sided abdominal pain. EXAM: CT ABDOMEN AND PELVIS WITH CONTRAST TECHNIQUE: Multidetector CT imaging of the abdomen and pelvis was performed using the standard protocol  following bolus administration of intravenous contrast. RADIATION DOSE REDUCTION: This exam was performed according to the departmental dose-optimization program which includes automated exposure control, adjustment of the mA and/or kV according to patient size and/or use of iterative reconstruction technique. CONTRAST:  178m OMNIPAQUE IOHEXOL 300 MG/ML  SOLN COMPARISON:  None Available. FINDINGS: Lower chest:  Unremarkable. Hepatobiliary: No hepatic laceration or mass identified. Gallbladder is unremarkable. No evidence of biliary ductal dilatation. Pancreas: No parenchymal laceration, mass, or inflammatory changes identified. Spleen: No evidence of splenic laceration. Adrenal/Urinary Tract: No hemorrhage or parenchymal lacerations identified. No evidence of suspicious renal masses or hydronephrosis. Stomach/Bowel: Unopacified bowel loops are unremarkable in appearance. No evidence of hemoperitoneum. Normal appendix visualized. Diverticulosis is seen mainly involving the descending and sigmoid colon, however there is no evidence of diverticulitis. Vascular/Lymphatic: No evidence of abdominal aortic injury. Aortic atherosclerotic calcification incidentally noted. No pathologically enlarged lymph nodes identified. Reproductive:  No mass or other significant abnormality identified. Other: Tiny umbilical hernia, which contains only fat. Small bilateral fat-containing inguinal hernia is also noted. Musculoskeletal: Acute compression fracture of the L4 vertebral body is seen. No evidence of spondylolisthesis. IMPRESSION: Acute compression fracture of L4 vertebral body. No evidence of spondylolisthesis. No evidence of visceral i organ njury or hemoperitoneum. Colonic diverticulosis, without radiographic evidence of diverticulitis. Tiny umbilical and small bilateral inguinal hernias, which contain only fat. Electronically Signed   By: JMarlaine HindM.D.   On: 05/18/2022 14:24    Assessment/Plan: L4 fracture: The plan is  to get a standing lateral lumbar x-ray.  If there is no change in the patient's L4-5 spondylolisthesis, the patient can be discharged home and follow-up with my office in 4 to 6 weeks.  I have instructed him on wearing the lumbosacral corset and answered all their questions.  LOS: 0 days     JOphelia Charter2/25/2024, 9:38 AM     Patient ID: Dwayne Nelson male   DOB: 107-Dec-1957 67y.o.   MRN: [id number]

## 2022-05-20 ENCOUNTER — Inpatient Hospital Stay (HOSPITAL_COMMUNITY): Payer: Medicare Other

## 2022-05-20 DIAGNOSIS — S32040A Wedge compression fracture of fourth lumbar vertebra, initial encounter for closed fracture: Secondary | ICD-10-CM | POA: Diagnosis not present

## 2022-05-20 LAB — HIV ANTIBODY (ROUTINE TESTING W REFLEX): HIV Screen 4th Generation wRfx: NONREACTIVE

## 2022-05-20 LAB — GLUCOSE, CAPILLARY
Glucose-Capillary: 135 mg/dL — ABNORMAL HIGH (ref 70–99)
Glucose-Capillary: 135 mg/dL — ABNORMAL HIGH (ref 70–99)
Glucose-Capillary: 176 mg/dL — ABNORMAL HIGH (ref 70–99)
Glucose-Capillary: 175 mg/dL — ABNORMAL HIGH (ref 70–99)

## 2022-05-20 MED ORDER — MELATONIN 5 MG PO TABS
5.0000 mg | ORAL_TABLET | Freq: Every day | ORAL | Status: AC
  Administered 2022-05-20 (×2): 5 mg via ORAL
  Filled 2022-05-20 (×2): qty 1

## 2022-05-20 NOTE — Progress Notes (Signed)
Physical Therapy Treatment Patient Details Name: Dwayne Nelson MRN: [id number] DOB: 1955/04/14 Today's Date: 05/20/2022   History of Present Illness Patient is a 67 y/o male who presents on 2/24 after mechanical fall on 2/22 when walking to [employer]. Found to have L4 burst fx and  L4-5 spondylolisthesis. PMH includes obesity, DM, HTN.    PT Comments    Patient progressing slowly with mobility. Seen again in afternoon to see if pt can progress mobility after being pre medicated as per MD will likely need SNF. Continues to require assist for bed mobility and standing. Able to side step along side of bed today and forwards/backwards x2 with MIn A for balance. Limited due to pain in back radiating into left buttocks. Pt will not have necessary support at home so discharge recommendation updated to SNF. Pt agreeable. Will follow acutely.   Recommendations for follow up therapy are one component of a multi-disciplinary discharge planning process, led by the attending physician.  Recommendations may be updated based on patient status, additional functional criteria and insurance authorization.  Follow Up Recommendations  Skilled nursing-short term rehab (<3 hours/day) Can patient physically be transported by private vehicle: No   Assistance Recommended at Discharge Frequent or constant Supervision/Assistance  Patient can return home with the following A lot of help with walking and/or transfers;A lot of help with bathing/dressing/bathroom;Assist for transportation;Help with stairs or ramp for entrance;Assistance with cooking/housework   Equipment Recommendations  Rolling walker (2 wheels)    Recommendations for Other Services       Precautions / Restrictions Precautions Precautions: Back Precaution Booklet Issued: No Required Braces or Orthoses: Spinal Brace Spinal Brace: Lumbar corset;Applied in sitting position Restrictions Weight Bearing Restrictions: No     Mobility  Bed  Mobility Overal bed mobility: Needs Assistance Bed Mobility: Rolling, Sidelying to Sit Rolling: Min guard Sidelying to sit: Mod assist, HOB elevated     Sit to sidelying: Min assist General bed mobility comments: good demo of log roll technique, assist with trunk elevation due to left shoulder discomfort/pain, some assist to bring LEs back into bed. Use of rail.    Transfers Overall transfer level: Needs assistance Equipment used: Rolling walker (2 wheels) Transfers: Sit to/from Stand Sit to Stand: Min assist, +2 safety/equipment, From elevated surface          Lateral/Scoot Transfers: Min guard General transfer comment: Min A to power and steady in standing x2 from EOB with cues for hand placement/technique.    Ambulation/Gait Ambulation/Gait assistance: Min assist, +2 safety/equipment Gait Distance (Feet): 3 Feet Assistive device: Rolling walker (2 wheels)   Gait velocity: decreased Gait velocity interpretation: <1.31 ft/sec, indicative of household ambulator   General Gait Details: Able to side step along side bed with assist for balance, able to step forward x2 and backwards with support.   Stairs             Wheelchair Mobility    Modified Rankin (Stroke Patients Only)       Balance Overall balance assessment: Needs assistance Sitting-balance support: Feet supported, No upper extremity supported Sitting balance-Leahy Scale: Fair Sitting balance - Comments: Requires at least 1 UE support due to pain, assist to donn brace Postural control: Right lateral lean Standing balance support: During functional activity, Bilateral upper extremity supported, Reliant on assistive device for balance Standing balance-Leahy Scale: Poor Standing balance comment: Requires UE support  Cognition Arousal/Alertness: Awake/alert Behavior During Therapy: WFL for tasks assessed/performed Overall Cognitive Status: Within Functional Limits  for tasks assessed                                          Exercises      General Comments General comments (skin integrity, edema, etc.): Son present during session.      Pertinent Vitals/Pain Pain Assessment Pain Assessment: Faces Faces Pain Scale: Hurts whole lot Pain Location: back Pain Descriptors / Indicators: Sore, Grimacing, Guarding, Discomfort Pain Intervention(s): Monitored during session, Repositioned, Patient requesting pain meds-RN notified, Limited activity within patient's tolerance, RN gave pain meds during session    Home Living                          Prior Function            PT Goals (current goals can now be found in the care plan section) Acute Rehab PT Goals Patient Stated Goal: decrease pain PT Goal Formulation: With patient Time For Goal Achievement: 06/03/22 Potential to Achieve Goals: Fair Progress towards PT goals: Progressing toward goals (slowly)    Frequency    Min 3X/week      PT Plan Discharge plan needs to be updated    Co-evaluation              AM-PAC PT "6 Clicks" Mobility   Outcome Measure  Help needed turning from your back to your side while in a flat bed without using bedrails?: A Little Help needed moving from lying on your back to sitting on the side of a flat bed without using bedrails?: A Lot Help needed moving to and from a bed to a chair (including a wheelchair)?: A Little Help needed standing up from a chair using your arms (e.g., wheelchair or bedside chair)?: A Little Help needed to walk in hospital room?: Total Help needed climbing 3-5 steps with a railing? : Total 6 Click Score: 13    End of Session Equipment Utilized During Treatment: Gait belt;Back brace Activity Tolerance: Patient limited by pain Patient left: in bed;with call bell/phone within reach;with bed alarm set;with family/visitor present Nurse Communication: Mobility status PT Visit Diagnosis:  Pain;Difficulty in walking, not elsewhere classified (R26.2);Unsteadiness on feet (R26.81);Muscle weakness (generalized) (M62.81) Pain - part of body:  (back into left buttock)     Time: 1522-1600 PT Time Calculation (min) (ACUTE ONLY): 38 min  Charges:  $Therapeutic Activity: 8-22 mins                     Marisa Severin, PT, DPT Acute Rehabilitation Services Secure chat preferred Office [phone]      Marguarite Arbour A Sabra Heck 05/20/2022, 4:16 PM

## 2022-05-20 NOTE — Evaluation (Addendum)
Physical Therapy Evaluation Patient Details Name: Dwayne Nelson MRN: [id number] DOB: 05-02-1955 Today's Date: 05/20/2022  History of Present Illness  Patient is a 67 y/o male who presents on 2/24 after mechanical fall on 2/22 when walking to [employer]. Found to have L4 burst fx and  L4-5 spondylolisthesis. PMH includes obesity, DM, HTN.  Clinical Impression  Patient presents with pain, decreased activity tolerance and impaired mobility s/p above. Pt lives alone and is independent for ADLs/IADLs at baseline. Today, pt requires Min-mod A for bed mobility, transfers and standing. Unable to take steps today due to pain and poor standing tolerance. Able to scoot along side bed to reposition self in bed. Encouraged trying to sit on EOB a few more times today and increase HOB height to improve upright tolerance. Called ortho tech to bring extender for LSO. Education re: back precautions, log roll technique, positioning, brace wearing etc. Will need to improve mobility prior to return home. Will follow acutely to maximize independence and mobility prior to return home.     Recommendations for follow up therapy are one component of a multi-disciplinary discharge planning process, led by the attending physician.  Recommendations may be updated based on patient status, additional functional criteria and insurance authorization.  Follow Up Recommendations Home health PT (pending improvement in mobility)      Assistance Recommended at Discharge Frequent or constant Supervision/Assistance  Patient can return home with the following  A lot of help with walking and/or transfers;A lot of help with bathing/dressing/bathroom;Assist for transportation;Help with stairs or ramp for entrance;Assistance with cooking/housework    Equipment Recommendations Rolling walker (2 wheels)  Recommendations for Other Services       Functional Status Assessment Patient has had a recent decline in their functional status and  demonstrates the ability to make significant improvements in function in a reasonable and predictable amount of time.     Precautions / Restrictions Precautions Precautions: Back Precaution Booklet Issued: No Precaution Comments: Reviewed back precautions Required Braces or Orthoses: Spinal Brace Spinal Brace: Lumbar corset Restrictions Weight Bearing Restrictions: No      Mobility  Bed Mobility Overal bed mobility: Needs Assistance Bed Mobility: Rolling, Sidelying to Sit, Sit to Sidelying Rolling: Supervision Sidelying to sit: Mod assist, HOB elevated     Sit to sidelying: Mod assist, HOB elevated General bed mobility comments: Rolling to right/left without difficulty using rail, Mod A to bring trunk to upright position and assist to bring LEs back into bed. Cues for log roll technique, use of rail.    Transfers Overall transfer level: Needs assistance Equipment used: Rolling walker (2 wheels) Transfers: Sit to/from Stand, Bed to chair/wheelchair/BSC Sit to Stand: Min assist          Lateral/Scoot Transfers: Min guard General transfer comment: MIn A to steady in standing, stood from EOB x2 with cues for hand placement/technique, second time barely able to get upright with flexed posture and limited due to pain. Able to laterally scoot along side bed with increased time and increased pain.    Ambulation/Gait               General Gait Details: Unable to side step along side bed with encouragement and assist due to pain.  Stairs            Wheelchair Mobility    Modified Rankin (Stroke Patients Only)       Balance Overall balance assessment: Needs assistance Sitting-balance support: Feet supported, Bilateral upper extremity supported Sitting balance-Leahy Scale:  Fair Sitting balance - Comments: BUE support as position of comfort due to pain Postural control: Right lateral lean Standing balance support: During functional activity, Reliant on  assistive device for balance, Bilateral upper extremity supported Standing balance-Leahy Scale: Poor Standing balance comment: Requires UE support, poor standing tolerance due to pain, flexed posture                             Pertinent Vitals/Pain Pain Assessment Pain Assessment: Faces Faces Pain Scale: Hurts whole lot Pain Location: back Pain Descriptors / Indicators: Sore, Grimacing, Guarding, Discomfort Pain Intervention(s): Monitored during session, Repositioned, Patient requesting pain meds-RN notified, Limited activity within patient's tolerance    Home Living Family/patient expects to be discharged to:: Private residence Living Arrangements: Alone Available Help at Discharge: Family;Available PRN/intermittently Type of Home: House Home Access: Stairs to enter Entrance Stairs-Rails: None Entrance Stairs-Number of Steps: 2   Home Layout: One level Home Equipment: None      Prior Function Prior Level of Function : Independent/Modified Independent             Mobility Comments: independent, drives. ADLs Comments: independent     Hand Dominance   Dominant Hand: Right    Extremity/Trunk Assessment   Upper Extremity Assessment Upper Extremity Assessment: Defer to OT evaluation    Lower Extremity Assessment Lower Extremity Assessment: Generalized weakness (but functional)    Cervical / Trunk Assessment Cervical / Trunk Assessment: Other exceptions Cervical / Trunk Exceptions: back fx  Communication   Communication: No difficulties  Cognition Arousal/Alertness: Awake/alert Behavior During Therapy: WFL for tasks assessed/performed Overall Cognitive Status: Within Functional Limits for tasks assessed                                          General Comments General comments (skin integrity, edema, etc.): VSS on RA. LSO was donned around pt's groin area upon arrival directly on skin. Removed it and attempted to donn at the his  low back but it did not fit. Called [employer]otech to bring extender.    Exercises     Assessment/Plan    PT Assessment Patient needs continued PT services  PT Problem List Decreased strength;Decreased mobility;Pain;Decreased knowledge of use of DME;Decreased knowledge of precautions       PT Treatment Interventions Therapeutic activities;Gait training;Therapeutic exercise;Balance training;Stair training;Functional mobility training;Patient/family education;DME instruction    PT Goals (Current goals can be found in the Care Plan section)  Acute Rehab PT Goals Patient Stated Goal: decrease pain PT Goal Formulation: With patient Time For Goal Achievement: 06/03/22 Potential to Achieve Goals: Fair    Frequency Min 3X/week     Co-evaluation               AM-PAC PT "6 Clicks" Mobility  Outcome Measure Help needed turning from your back to your side while in a flat bed without using bedrails?: A Little Help needed moving from lying on your back to sitting on the side of a flat bed without using bedrails?: A Lot Help needed moving to and from a bed to a chair (including a wheelchair)?: A Little Help needed standing up from a chair using your arms (e.g., wheelchair or bedside chair)?: A Little Help needed to walk in hospital room?: A Lot Help needed climbing 3-5 steps with a railing? : Total 6 Click Score: 14  End of Session Equipment Utilized During Treatment: Back brace (did not fit correctly but in room) Activity Tolerance: Patient limited by pain Patient left: in bed;with call bell/phone within reach;with family/visitor present Nurse Communication: Mobility status;Patient requests pain meds PT Visit Diagnosis: Pain;Difficulty in walking, not elsewhere classified (R26.2);Unsteadiness on feet (R26.81);Muscle weakness (generalized) (M62.81) Pain - part of body:  (back)    Time: [id number] PT Time Calculation (min) (ACUTE ONLY): 38 min   Charges:   PT Evaluation $PT Eval  Moderate Complexity: 1 Mod PT Treatments $Therapeutic Activity: 23-37 mins        Marisa Severin, PT, DPT Acute Rehabilitation Services Secure chat preferred Office [place] 05/20/2022, 12:30 PM

## 2022-05-20 NOTE — Progress Notes (Signed)
Subjective: The patient is alert and pleasant.  His son is at the bedside.  Objective: Vital signs in last 24 hours: Temp:  [97.8 F (36.6 C)-98.7 F (37.1 C)] 98.7 F (37.1 C) (02/26 1700) Pulse Rate:  [56-69] 64 (02/26 1700) Resp:  [17-20] 17 (02/26 1700) BP: (118-136)/(69-84) 125/69 (02/26 1700) SpO2:  [93 %-95 %] 93 % (02/26 1700) Estimated body mass index is 39.54 kg/m as calculated from the following:   Height as of this encounter: '5\' 10"'$  (1.778 m).   Weight as of this encounter: 125 kg.   Intake/Output from previous day: 02/25 0701 - 02/26 0700 In: 1180 [P.O.:1180] Out: 2500 [Urine:2500] Intake/Output this shift: Total I/O In: -  Out: 500 [Urine:500]  Physical exam the patient is alert and pleasant.  His strength is normal.  I reviewed the patient's standing lateral lumbar x-ray with the corset on.  There has been mild further compaction of his L4 fracture but no significant further subluxation.  Lab Results: Recent Labs    05/18/22 1218 05/18/22 1538  WBC  --  6.1  HGB 13.9 13.7  HCT 41.0 42.5  PLT  --  184   BMET Recent Labs    05/18/22 1538 05/19/22 1421  NA 139 141  K 4.1 3.9  CL 107 106  CO2 23 20*  GLUCOSE 210* 136*  BUN 14 23  CREATININE 0.76 1.05  CALCIUM 8.2* 8.8*    Studies/Results: DG Lumbar Spine 1 View  Result Date: 05/20/2022 CLINICAL DATA:  L4 burst fracture EXAM: LUMBAR SPINE - 1 VIEW COMPARISON:  CT 2 days ago. FINDINGS: Slight further collapse of the burst fracture at L4 since the CT scan of 2 days ago. Anterolisthesis of the main vertebral body fragments of 8 mm. This anterolisthesis is presumed secondary to the facet arthropathy shown by CT. IMPRESSION: Slight further collapse of the burst fracture at L4 since the CT scan of 2 days ago. 8 mm of anterolisthesis at L4-5, presumed secondary to chronic facet arthropathy. Electronically Signed   By: Nelson Chimes M.D.   On: 05/20/2022 10:50    Assessment/Plan: L4 burst fracture: I  think the patient will heal adequately in a lumbosacral corset.  He does not feel he is be able to go home as he lives alone.  He will need skilled nursing facility placement short-term.  I have answered all their questions.  LOS: 1 day     Ophelia Charter 05/20/2022, 5:49 PM     Patient ID: Dwayne Nelson, male   DOB: Aug 19, 1955, 67 y.o.   MRN: [id number]

## 2022-05-20 NOTE — Evaluation (Signed)
Occupational Therapy Evaluation Patient Details Name: Dwayne Nelson MRN: [id number] DOB: Apr 22, 1955 Today's Date: 05/20/2022   History of Present Illness Patient is a 67 y/o male who presents on 2/24 after mechanical fall on 2/22 when walking to [employer]. Found to have L4 burst fx and  L4-5 spondylolisthesis. PMH includes obesity, DM, HTN.   Clinical Impression   PTA pt lives independently alone. Pt demonstrates a significant functional decline due to deficits listed below, requiring mod to Max A with LB ADL tasks and Min A +2 to take 2 steps forward and backward as that is all he could tolerate due to complaints of back pain (pt premedicated). Pt will benefit from rehab at SNF to facilitate safe DC home - pt agreeable. Acute OT to follow.      Recommendations for follow up therapy are one component of a multi-disciplinary discharge planning process, led by the attending physician.  Recommendations may be updated based on patient status, additional functional criteria and insurance authorization.   Follow Up Recommendations  Skilled nursing-short term rehab (<3 hours/day)     Assistance Recommended at Discharge Frequent or constant Supervision/Assistance  Patient can return home with the following A lot of help with walking and/or transfers;A lot of help with bathing/dressing/bathroom;Assistance with cooking/housework;Assist for transportation;Help with stairs or ramp for entrance    Functional Status Assessment  Patient has had a recent decline in their functional status and demonstrates the ability to make significant improvements in function in a reasonable and predictable amount of time.  Equipment Recommendations  BSC/3in1    Recommendations for Other Services       Precautions / Restrictions Precautions Precautions: Back Precaution Booklet Issued: No Precaution Comments: Reviewed back precautions Required Braces or Orthoses: Spinal Brace Spinal Brace: Lumbar corset;Applied in  sitting position Restrictions Weight Bearing Restrictions: No      Mobility Bed Mobility Overal bed mobility: Needs Assistance Bed Mobility: Rolling, Sidelying to Sit Rolling: Min guard Sidelying to sit: Mod assist, HOB elevated     Sit to sidelying: Min assist General bed mobility comments: good demo of log roll technique, assist with trunk elevation due to left shoulder discomfort/pain, some assist to bring LEs back into bed. Use of rail.    Transfers Overall transfer level: Needs assistance Equipment used: Rolling walker (2 wheels) Transfers: Sit to/from Stand Sit to Stand: Min assist, +2 safety/equipment, From elevated surface           General transfer comment: Min A to power and steady in standing x2 from EOB with cues for hand placement/technique. Able to take several steps up toward [facility]; only able to take 2 steps forward adn 2 steps back to bed      Balance Overall balance assessment: Needs assistance Sitting-balance support: Feet supported, No upper extremity supported Sitting balance-Leahy Scale: Fair Sitting balance - Comments: Requires at least 1 UE support due to pain, assist to donn brace Postural control: Right lateral lean Standing balance support: During functional activity, Bilateral upper extremity supported, Reliant on assistive device for balance Standing balance-Leahy Scale: Poor Standing balance comment: Requires UE support                           ADL either performed or assessed with clinical judgement   ADL Overall ADL's : Needs assistance/impaired     Grooming: Set up;Sitting   Upper Body Bathing: Set up;Sitting   Lower Body Bathing: Moderate assistance;Sit to/from stand   Upper Body  Dressing : Moderate assistance;Sitting Upper Body Dressing Details (indicate cue type and reason): mod A to donn back brace Lower Body Dressing: Moderate assistance;Sit to/from stand   Toilet Transfer: +2 for physical assistance;Minimal  assistance;Rolling walker (2 wheels) (simulated)   Toileting- Clothing Manipulation and Hygiene: Maximal assistance       Functional mobility during ADLs: Minimal assistance;+2 for physical assistance;Rolling walker (2 wheels) General ADL Comments: unable to complete figure four technique; would benefit form AE     Vision Baseline Vision/History: 1 Wears glasses       Perception     Praxis      Pertinent Vitals/Pain Pain Assessment Pain Assessment: Faces Faces Pain Scale: Hurts whole lot Pain Location: back Pain Descriptors / Indicators: Sore, Grimacing, Guarding, Discomfort Pain Intervention(s): Limited activity within patient's tolerance, RN gave pain meds during session     Hand Dominance Right   Extremity/Trunk Assessment Upper Extremity Assessment Upper Extremity Assessment: LUE deficits/detail LUE Deficits / Details: shoulder ROM deficits at baseline from "arthritis"   Lower Extremity Assessment Lower Extremity Assessment: Defer to PT evaluation   Cervical / Trunk Assessment Cervical / Trunk Assessment: Other exceptions Cervical / Trunk Exceptions: back fx (morbid obesity)   Communication Communication Communication: No difficulties   Cognition Arousal/Alertness: Awake/alert Behavior During Therapy: WFL for tasks assessed/performed Overall Cognitive Status: Within Functional Limits for tasks assessed                                       General Comments  son present during session    Exercises     Shoulder Instructions      Home Living Family/patient expects to be discharged to:: Private residence Living Arrangements: Alone Available Help at Discharge: Family;Available PRN/intermittently Type of Home: House Home Access: Stairs to enter [employer] of Steps: 2 Entrance Stairs-Rails: None Home Layout: One level     Bathroom Shower/Tub: Occupational psychologist: Standard     Home Equipment: None           Prior Functioning/Environment Prior Level of Function : Independent/Modified Independent             Mobility Comments: independent, drives. ADLs Comments: independent        OT Problem List: Decreased strength;Decreased range of motion;Decreased activity tolerance;Impaired balance (sitting and/or standing);Decreased safety awareness;Decreased knowledge of use of DME or AE;Decreased knowledge of precautions;Obesity;Pain      OT Treatment/Interventions: Self-care/ADL training;Therapeutic exercise;DME and/or AE instruction;Therapeutic activities;Patient/family education;Balance training    OT Goals(Current goals can be found in the care plan section) Acute Rehab OT Goals Patient Stated Goal: to get some rehab before going home OT Goal Formulation: With patient Time For Goal Achievement: 06/03/22 Potential to Achieve Goals: Good ADL Goals Pt Will Perform Lower Body Bathing: with min assist;sit to/from stand;with adaptive equipment Pt Will Perform Lower Body Dressing: with min assist;sit to/from stand;with adaptive equipment Pt Will Transfer to Toilet: ambulating;with min assist Pt Will Perform Toileting - Clothing Manipulation and hygiene: with min assist;sit to/from stand;with adaptive equipment Additional ADL Goal #1: Pt will independently donn back brace Additional ADL Goal #2: Pt will independently verbalize 3 back precautions  OT Frequency: Min 2X/week    Co-evaluation PT/OT/SLP Co-Evaluation/Treatment: Yes Reason for Co-Treatment: To address functional/ADL transfers   OT goals addressed during session: ADL's and self-care      AM-PAC OT "6 Clicks" Daily Activity  Outcome Measure Help from another person eating meals?: None Help from another person taking care of personal grooming?: A Little Help from another person toileting, which includes using toliet, bedpan, or urinal?: A Lot Help from another person bathing (including washing, rinsing, drying)?: A  Lot Help from another person to put on and taking off regular upper body clothing?: A Lot Help from another person to put on and taking off regular lower body clothing?: A Lot 6 Click Score: 15   End of Session Equipment Utilized During Treatment: Gait belt;Rolling walker (2 wheels);Back brace Nurse Communication: Mobility status;Patient requests pain meds;Precautions  Activity Tolerance: Patient tolerated treatment well Patient left: in bed;with call bell/phone within reach;with bed alarm set;with family/visitor present  OT Visit Diagnosis: Unsteadiness on feet (R26.81);Other abnormalities of gait and mobility (R26.89);Muscle weakness (generalized) (M62.81);History of falling (Z91.81);Pain Pain - part of body:  (bakc; R buttock)                Time: 1522-1600 OT Time Calculation (min): 38 min Charges:  OT General Charges $OT Visit: 1 Visit OT Evaluation $OT Eval Moderate Complexity: [place], OT/L   Acute OT Clinical Specialist [place] Pager [phone] Office [phone]   [facility] 05/20/2022, 4:37 PM

## 2022-05-20 NOTE — TOC CAGE-AID Note (Signed)
Transition of Care [facility]) - CAGE-AID Screening   Patient Details  Name: Dwayne Nelson MRN: [id number] Date of Birth: 1955/07/31  Transition of Care [facility]) CM/SW Contact:    Army Melia, RN Phone Number:[phone] 05/20/2022, 9:25 PM    CAGE-AID Screening:    Have You Ever Felt You Ought to Cut Down on Your Drinking or Drug Use?: No Have People Annoyed You By Critizing Your Drinking Or Drug Use?: No Have You Felt Bad Or Guilty About Your Drinking Or Drug Use?: No Have You Ever Had a Drink or Used Drugs First Thing In The Morning to Steady Your Nerves or to Get Rid of a Hangover?: No CAGE-AID Score: 0  Substance Abuse Education Offered: No

## 2022-05-20 NOTE — Progress Notes (Signed)
PROGRESS NOTE    Dwayne Nelson  [id number] DOB: 1955/05/02 DOA: 05/18/2022 PCP: Shirline Frees, MD   Brief Narrative: 67 year old with past medical history significant for class II obesity, diabetes type 2, hyperlipidemia, hypertension, who had a mechanical fall 2 days prior to admission, presented with progressive worsening lower back pain.  He was found to have acute superior endplate compression fracture of L4 vertebral body with up to 45% loss of height centrally.  Mild retropulsion of the superior endplate.  Nondisplaced fracture of the bilateral L4 pedicles. There is antegrade 1 anterolisthesis of L4 on L5. 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   Admitted for pain control and further evaluation by neurosurgery.    Assessment & Plan:   Principal Problem:   Closed compression fracture of L4 lumbar vertebra, initial encounter [facility]) Active Problems:   Essential hypertension   Hyperlipidemia   Class 2 obesity   Hypocalcemia   Type 2 diabetes mellitus with hyperglycemia (HCC)   1-Close compression fracture of L4 vertebra Continue with pain management  Neurosurgery consulted and is recommending standing  x-ray today If X ray negative for changes L 4-5 spondylolisthesis patient can be discharge and FU with neurosurgery in 4-6 weeks.  Follow PT, OT evaluation.  Needs to wear Lumbosacral corset.  X ray: Slight further collapse of the burst fracture at L4 since the CT scan of 2 days ago. 8 mm of anterolisthesis at L4-5, presumed secondary to chronic facet arthropathy. Discussed with Dr Arnoldo Morale, slight further collapse L 4 fracture expected, not significant. Patient to follow up with him in 4-6 weeks.    2-Diarrhea; develops diarrhea in the hospital.  Resolved.   3-Diabetes type 2 with hyperglycemia Hold Metformin while inpatient Ac;7.9 Continue gargling, Actos, Jardiance.  Sliding scale insulin  Hypertension: Continue with metoprolol, Lotrel. Holding  hydrochlorothiazide due to diarrhea  Hyperlipidemia: Continue with atorvastatin.  Class  2 obesity: Need lifestyle modification.  Hypocalcemia: mild.          Estimated body mass index is 39.54 kg/m as calculated from the following:   Height as of this encounter: '5\' 10"'$  (1.778 m).   Weight as of this encounter: 125 kg.   DVT prophylaxis: SCD, Lovenox Code Status: Full Code Family Communication: Care discussed with patient Disposition Plan:  Status is: Observation The patient remains OBS appropriate and will d/c before 2 midnights.    Consultants:  Neurosurgery, Dr Arnoldo Morale  Procedures:  None  Antimicrobials:    Subjective: Diarrhea resolved. He just got pain medication, feels sleepy.  Back pain better after he got pain meds.     Objective: Vitals:   05/19/22 1720 05/19/22 2042 05/20/22 0622 05/20/22 0939  BP: 130/68 136/84 136/84 118/75  Pulse: 77 69 (!) 56 61  Resp: '17 20 18 17  '$ Temp: 98 F (36.7 C) 98.3 F (36.8 C) 97.8 F (36.6 C) 98.5 F (36.9 C)  TempSrc:  Oral Oral Oral  SpO2: 94% 95% 95%   Weight:      Height:        Intake/Output Summary (Last 24 hours) at 05/20/2022 1242 Last data filed at 05/20/2022 [id number] Gross per 24 hour  Intake 720 ml  Output 2000 ml  Net -1280 ml    Filed Weights   05/18/22 1651  Weight: 125 kg    Examination:  General exam: NAD Respiratory system: CTA Cardiovascular system: S 1, S 2 RRR Gastrointestinal system: BS Present, soft, nt Central nervous system: alert Extremities: no edema   Data  Reviewed: I have personally reviewed following labs and imaging studies  CBC: Recent Labs  Lab 05/18/22 1218 05/18/22 1538  WBC  --  6.1  NEUTROABS  --  4.6  HGB 13.9 13.7  HCT 41.0 42.5  MCV  --  94.7  PLT  --  [id number]    Basic Metabolic Panel: Recent Labs  Lab 05/18/22 1218 05/18/22 1538 05/19/22 1421  NA 140 139 141  K 4.2 4.1 3.9  CL 105 107 106  CO2  --  23 20*  GLUCOSE 247* 210* 136*  BUN '16  14 23  '$ CREATININE 0.60* 0.76 1.05  CALCIUM  --  8.2* 8.8*    GFR: Estimated Creatinine Clearance: 89.3 mL/min (by C-G formula based on SCr of 1.05 mg/dL). Liver Function Tests: Recent Labs  Lab 05/18/22 1830  AST 22  ALT 22  ALKPHOS 78  BILITOT 0.8  PROT 6.5  ALBUMIN 3.3*    No results for input(s): "LIPASE", "AMYLASE" in the last 168 hours. No results for input(s): "AMMONIA" in the last 168 hours. Coagulation Profile: No results for input(s): "INR", "PROTIME" in the last 168 hours. Cardiac Enzymes: No results for input(s): "CKTOTAL", "CKMB", "CKMBINDEX", "TROPONINI" in the last 168 hours. BNP (last 3 results) No results for input(s): "PROBNP" in the last 8760 hours. HbA1C: Recent Labs    05/18/22 1830  HGBA1C 7.9*    CBG: Recent Labs  Lab 05/19/22 1248 05/19/22 1721 05/19/22 2048 05/20/22 0836 05/20/22 1124  GLUCAP 174* 147* 215* 135* 135*    Lipid Profile: No results for input(s): "CHOL", "HDL", "LDLCALC", "TRIG", "CHOLHDL", "LDLDIRECT" in the last 72 hours. Thyroid Function Tests: No results for input(s): "TSH", "T4TOTAL", "FREET4", "T3FREE", "THYROIDAB" in the last 72 hours. Anemia Panel: No results for input(s): "VITAMINB12", "FOLATE", "FERRITIN", "TIBC", "IRON", "RETICCTPCT" in the last 72 hours. Sepsis Labs: No results for input(s): "PROCALCITON", "LATICACIDVEN" in the last 168 hours.  No results found for this or any previous visit (from the past 240 hour(s)).       Radiology Studies: DG Lumbar Spine 1 View  Result Date: 05/20/2022 CLINICAL DATA:  L4 burst fracture EXAM: LUMBAR SPINE - 1 VIEW COMPARISON:  CT 2 days ago. FINDINGS: Slight further collapse of the burst fracture at L4 since the CT scan of 2 days ago. Anterolisthesis of the main vertebral body fragments of 8 mm. This anterolisthesis is presumed secondary to the facet arthropathy shown by CT. IMPRESSION: Slight further collapse of the burst fracture at L4 since the CT scan of 2 days  ago. 8 mm of anterolisthesis at L4-5, presumed secondary to chronic facet arthropathy. Electronically Signed   By: Nelson Chimes M.D.   On: 05/20/2022 10:50   DG Ribs Unilateral W/Chest Right  Result Date: 05/18/2022 CLINICAL DATA:  Fall, rib pain EXAM: RIGHT RIBS AND CHEST - 3+ VIEW COMPARISON:  None Available. FINDINGS: No fracture or other bone lesions are seen involving the ribs. There is no evidence of pneumothorax or pleural effusion. Both lungs are clear. Heart size and mediastinal contours are within normal limits. IMPRESSION: Negative. Electronically Signed   By: Davina Poke D.O.   On: 05/18/2022 14:48   CT Lumbar Spine Wo Contrast  Result Date: 05/18/2022 CLINICAL DATA:  Back trauma, no prior imaging (Age >= 16y) EXAM: CT LUMBAR SPINE WITHOUT CONTRAST TECHNIQUE: Multidetector CT imaging of the lumbar spine was performed without intravenous contrast administration. Multiplanar CT image reconstructions were also generated. RADIATION DOSE REDUCTION: This exam was performed according to the  departmental dose-optimization program which includes automated exposure control, adjustment of the mA and/or kV according to patient size and/or use of iterative reconstruction technique. COMPARISON:  Same day abdominopelvic CT FINDINGS: Segmentation: 5 lumbar type vertebrae. Alignment: Grade 1 anterolisthesis of L4 on L5. Vertebrae: Acute superior endplate compression fracture of the L4 vertebral body with up to 45% vertebral body height loss centrally. Mild retropulsion at the superior endplate. Nondisplaced fractures of the bilateral L4 pedicles. Remaining lumbar vertebral body heights are maintained without additional fracture. Facet joints are intact without dislocation. Bridging endplate osteophytes spanning from T9 to L1. Paraspinal and other soft tissues: Aortic atherosclerosis. See dedicated CT abdomen and pelvis for full characterization of the intra-abdominal findings. Disc levels: Moderate lower  lumbar facet arthropathy. Intervertebral disc heights are relatively well preserved. Disc uncovering at L4-L5. Left greater than right foraminal stenosis at L4-L5. Epidural lipomatosis is present from approximately L2 to S1. IMPRESSION: 1. Acute superior endplate compression fracture of the L4 vertebral body with up to 45% vertebral body height loss centrally. Mild retropulsion at the superior endplate. 2. Nondisplaced fractures of the bilateral L4 pedicles. 3. Degenerative grade 1 anterolisthesis of L4 on L5. 4. 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 Aortic Atherosclerosis (ICD10-I70.0). Electronically Signed   By: Davina Poke D.O.   On: 05/18/2022 14:34   CT ABDOMEN PELVIS W CONTRAST  Result Date: 05/18/2022 CLINICAL DATA:  Fall 2 days ago with blunt abdominal trauma. Right-sided abdominal pain. EXAM: CT ABDOMEN AND PELVIS WITH CONTRAST TECHNIQUE: Multidetector CT imaging of the abdomen and pelvis was performed using the standard protocol following bolus administration of intravenous contrast. RADIATION DOSE REDUCTION: This exam was performed according to the departmental dose-optimization program which includes automated exposure control, adjustment of the mA and/or kV according to patient size and/or use of iterative reconstruction technique. CONTRAST:  138m OMNIPAQUE IOHEXOL 300 MG/ML  SOLN COMPARISON:  None Available. FINDINGS: Lower chest:  Unremarkable. Hepatobiliary: No hepatic laceration or mass identified. Gallbladder is unremarkable. No evidence of biliary ductal dilatation. Pancreas: No parenchymal laceration, mass, or inflammatory changes identified. Spleen: No evidence of splenic laceration. Adrenal/Urinary Tract: No hemorrhage or parenchymal lacerations identified. No evidence of suspicious renal masses or hydronephrosis. Stomach/Bowel: Unopacified bowel loops are unremarkable in appearance. No evidence of hemoperitoneum. Normal appendix visualized. Diverticulosis is seen mainly  involving the descending and sigmoid colon, however there is no evidence of diverticulitis. Vascular/Lymphatic: No evidence of abdominal aortic injury. Aortic atherosclerotic calcification incidentally noted. No pathologically enlarged lymph nodes identified. Reproductive:  No mass or other significant abnormality identified. Other: Tiny umbilical hernia, which contains only fat. Small bilateral fat-containing inguinal hernia is also noted. Musculoskeletal: Acute compression fracture of the L4 vertebral body is seen. No evidence of spondylolisthesis. IMPRESSION: Acute compression fracture of L4 vertebral body. No evidence of spondylolisthesis. No evidence of visceral i organ njury or hemoperitoneum. Colonic diverticulosis, without radiographic evidence of diverticulitis. Tiny umbilical and small bilateral inguinal hernias, which contain only fat. Electronically Signed   By: JMarlaine HindM.D.   On: 05/18/2022 14:24        Scheduled Meds:  amLODipine  5 mg Oral Daily   And   benazepril  20 mg Oral Daily   aspirin EC  81 mg Oral Daily   atorvastatin  20 mg Oral Daily   empagliflozin  25 mg Oral Daily   enoxaparin (LOVENOX) injection  40 mg Subcutaneous Q24H   insulin aspart  0-20 Units Subcutaneous TID WC   insulin glargine-yfgn  30  Units Subcutaneous BID   melatonin  5 mg Oral QHS   metoprolol succinate  50 mg Oral QHS   pioglitazone  30 mg Oral QHS   Continuous Infusions:   LOS: 1 day    Time spent: 35 minutes    Tabari Volkert A Izadora Roehr, MD [facility]   If 7PM-7AM, please contact night-coverage [email or web address]  05/20/2022, 12:42 PM

## 2022-05-20 NOTE — TOC CM/SW Note (Signed)
Spoke to patient at bedside . PT recommending HHPT pending progression.   Patient from home alone. Patient does not feel he can go home alone. He has a son however son works.   Patient requesting to speak face to face to Dr Dwayne Nelson. Sent secure chat to attending , nurse and Dr Dwayne Nelson. Attending asked nurse to page Dr Dwayne Nelson.    PT to come back and assess for SNF.   Patient agreeable to SNF. Discussed SNF process

## 2022-05-20 NOTE — Progress Notes (Signed)
Orthopedic Tech Progress Note Patient Details:  Dwayne Nelson [birth year] [id number]  Ortho Devices Type of Ortho Device: Lumbar corsett Ortho Device/Splint Interventions: Adjustment Re-adjusted LSO per nursing request   Post Interventions Patient Tolerated: Well Instructions Provided: Adjustment of device  [name] OTR/L 05/20/2022, 10:18 AM

## 2022-05-21 DIAGNOSIS — S32040A Wedge compression fracture of fourth lumbar vertebra, initial encounter for closed fracture: Secondary | ICD-10-CM | POA: Diagnosis not present

## 2022-05-21 LAB — GLUCOSE, CAPILLARY
Glucose-Capillary: 84 mg/dL (ref 70–99)
Glucose-Capillary: 122 mg/dL — ABNORMAL HIGH (ref 70–99)
Glucose-Capillary: 145 mg/dL — ABNORMAL HIGH (ref 70–99)
Glucose-Capillary: 181 mg/dL — ABNORMAL HIGH (ref 70–99)

## 2022-05-21 NOTE — NC FL2 (Signed)
[place] LEVEL OF CARE FORM     IDENTIFICATION  Patient Name: Dwayne Nelson Birthdate: 1955-11-15 Sex: male Admission Date (Current Location): 05/18/2022  [facility] and [place] Number:  Herbalist and Address:  [facility] [place] [address] [place], [place] [address]      Provider Number: [id number]  Attending Physician Name and Address:  Elmarie Shiley, MD  Relative Name and Phone Number:  Clendon Lindemann [facility])  [phone]    Current Level of Care: Hospital Recommended Level of Care: [place] Prior Approval Number:    Date Approved/Denied:   PASRR Number: [id number]  Discharge Plan: SNF    Current Diagnoses: Patient Active Problem List   Diagnosis Date Noted   Closed compression fracture of L4 lumbar vertebra, initial encounter ([place]) 05/18/2022   Hyperlipidemia 05/18/2022   Body mass index (BMI) 40.0-44.9, adult ([place]) 05/18/2022   Class 2 obesity 05/18/2022   Hypocalcemia 05/18/2022   Type 2 diabetes mellitus with hyperglycemia ([place]) 05/18/2022   HYPERGLYCEMIA 10/12/2007   MELANOMA, ARM, HX OF 02/17/2007   HYPERLIPIDEMIA 08/11/2006   Essential hypertension 08/07/2006   ALLERGIC RHINITIS 08/07/2006    Orientation RESPIRATION BLADDER Height & Weight     Self, Time, Situation, Place  Normal Incontinent, External catheter Weight: 275 lb 9.2 oz (125 kg) Height:  '5\' 10"'$  (177.8 cm)  BEHAVIORAL SYMPTOMS/MOOD NEUROLOGICAL BOWEL NUTRITION STATUS      Continent Diet (See DC summary)  AMBULATORY STATUS COMMUNICATION OF NEEDS Skin   Extensive Assist Verbally Skin abrasions (R arm Lacerations)                       Personal Care Assistance Level of Assistance  Bathing, Feeding, Dressing Bathing Assistance: Maximum assistance Feeding assistance: Limited assistance Dressing Assistance: Maximum assistance     Functional Limitations Info  Sight, Hearing, Speech Sight Info: Adequate Hearing Info:  Adequate Speech Info: Adequate    SPECIAL CARE FACTORS FREQUENCY  PT (By licensed PT), OT (By licensed OT)     PT Frequency: 5x week OT Frequency: 5x week            Contractures Contractures Info: Not present    Additional Factors Info  Code Status, Allergies, Insulin Sliding Scale Code Status Info: Full Allergies Info: NKA   Insulin Sliding Scale Info: See DC summary       Current Medications (05/21/2022):  This is the current hospital active medication list Current Facility-Administered Medications  Medication Dose Route Frequency Provider Last Rate Last Admin   acetaminophen (TYLENOL) tablet 650 mg  650 mg Oral Q6H PRN Reubin Milan, MD       Or   acetaminophen (TYLENOL) suppository 650 mg  650 mg Rectal Q6H PRN Reubin Milan, MD       amLODipine (NORVASC) tablet 5 mg  5 mg Oral Daily Reubin Milan, MD   5 mg at 05/21/22 [id number]   And   benazepril (LOTENSIN) tablet 20 mg  20 mg Oral Daily Reubin Milan, MD   20 mg at 05/21/22 [id number]   aspirin EC tablet 81 mg  81 mg Oral Daily Reubin Milan, MD   81 mg at 05/21/22 [id number]   atorvastatin (LIPITOR) tablet 20 mg  20 mg Oral Daily Reubin Milan, MD   20 mg at 05/21/22 [id number]   empagliflozin (JARDIANCE) tablet 25 mg  25 mg Oral Daily Reubin Milan, MD   25  mg at 05/21/22 0926   enoxaparin (LOVENOX) injection 40 mg  40 mg Subcutaneous Q24H Reubin Milan, MD   40 mg at 05/20/22 2124   insulin aspart (novoLOG) injection 0-20 Units  0-20 Units Subcutaneous TID WC Reubin Milan, MD   4 Units at 05/20/22 1720   insulin glargine-yfgn [facility]) injection 30 Units  30 Units Subcutaneous BID Reubin Milan, MD   30 Units at 05/20/22 2300   metoprolol succinate (TOPROL-XL) 24 hr tablet 50 mg  50 mg Oral QHS Reubin Milan, MD   50 mg at 05/20/22 2125   morphine (PF) 4 MG/ML injection 4 mg  4 mg Intravenous Q4H PRN Reubin Milan, MD   4 mg at 05/18/22 1720   ondansetron [facility])  tablet 4 mg  4 mg Oral Q6H PRN Reubin Milan, MD       Or   ondansetron [facility]) injection 4 mg  4 mg Intravenous Q6H PRN Reubin Milan, MD   4 mg at 05/18/22 1720   oxyCODONE (Oxy IR/ROXICODONE) immediate release tablet 5 mg  5 mg Oral Q4H PRN Reubin Milan, MD   5 mg at 05/20/22 1537   pioglitazone (ACTOS) tablet 30 mg  30 mg Oral QHS Reubin Milan, MD   30 mg at 05/20/22 2125     Discharge Medications: Please see discharge summary for a list of discharge medications.  Relevant Imaging Results:  Relevant Lab Results:   Additional Information SS# [place], [place]

## 2022-05-21 NOTE — Progress Notes (Signed)
PROGRESS NOTE    Dwayne Nelson  [id number] DOB: Feb 11, 1956 DOA: 05/18/2022 PCP: Shirline Frees, MD   Brief Narrative: 67 year old with past medical history significant for class II obesity, diabetes type 2, hyperlipidemia, hypertension, who had a mechanical fall 2 days prior to admission, presented with progressive worsening lower back pain.  He was found to have acute superior endplate compression fracture of L4 vertebral body with up to 45% loss of height centrally.  Mild retropulsion of the superior endplate.  Nondisplaced fracture of the bilateral L4 pedicles. There is antegrade 1 anterolisthesis of L4 on L5. 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   Admitted for pain control and further evaluation by neurosurgery. Compression L 4 fracture stable on X ray. Plan for conservative management. Follow up with Neurosurgery in 4-6 weeks.   Awaiting rehab.      Assessment & Plan:   Principal Problem:   Closed compression fracture of L4 lumbar vertebra, initial encounter [facility]) Active Problems:   Essential hypertension   Hyperlipidemia   Class 2 obesity   Hypocalcemia   Type 2 diabetes mellitus with hyperglycemia (HCC)   1-Close compression fracture of L4 vertebra -Continue with pain management  -PT, OT recommend rehab.  -Needs to wear Lumbosacral corset.  -X ray: Slight further collapse of the burst fracture at L4 since the CT scan of 2 days ago. 8 mm of anterolisthesis at L4-5, presumed secondary to chronic facet arthropathy. -Discussed with Dr Arnoldo Morale, slight further collapse L 4 fracture expected, not significant. Patient to follow up with him in 4-6 weeks.  Continue with conservative management.    2-Diarrhea; develops diarrhea in the hospital.  Resolved.   3-Diabetes type 2 with hyperglycemia Hold Metformin while inpatient Ac;7.9 Continue gargling, Actos, Jardiance.  Sliding scale insulin  Hypertension: Continue with metoprolol, Lotrel. Holding  hydrochlorothiazide due to episode of  diarrhea  Hyperlipidemia: Continue with atorvastatin.  Class  2 obesity: Need lifestyle modification.  Hypocalcemia: mild.          Estimated body mass index is 39.54 kg/m as calculated from the following:   Height as of this encounter: '5\' 10"'$  (1.778 m).   Weight as of this encounter: 125 kg.   DVT prophylaxis: SCD, Lovenox Code Status: Full Code Family Communication: Care discussed with patient Disposition Plan:  Status is: Observation The patient remains OBS appropriate and will d/c before 2 midnights.    Consultants:  Neurosurgery, Dr Arnoldo Morale  Procedures:  None  Antimicrobials:    Subjective: No new complaints. Back pain improving.     Objective: Vitals:   05/20/22 1700 05/20/22 2003 05/21/22 0424 05/21/22 0855  BP: 125/69 (!) 142/80 128/82 132/79  Pulse: 64 66 (!) 59 65  Resp: '17 17  18  '$ Temp: 98.7 F (37.1 C) 98.4 F (36.9 C) 98 F (36.7 C) 98 F (36.7 C)  TempSrc: Oral Oral  Oral  SpO2: 93% 92% 95% 94%  Weight:      Height:        Intake/Output Summary (Last 24 hours) at 05/21/2022 1354 Last data filed at 05/21/2022 1202 Gross per 24 hour  Intake 480 ml  Output 1100 ml  Net -620 ml    Filed Weights   05/18/22 1651  Weight: 125 kg    Examination:  General exam: NAD Respiratory system: CTA Cardiovascular system: s 1, S 2 RRR Gastrointestinal system: BS present, soft, nt Central nervous system: Alert, follows command Extremities: No edema   Data Reviewed: I have personally reviewed following  labs and imaging studies  CBC: Recent Labs  Lab 05/18/22 1218 05/18/22 1538  WBC  --  6.1  NEUTROABS  --  4.6  HGB 13.9 13.7  HCT 41.0 42.5  MCV  --  94.7  PLT  --  [id number]    Basic Metabolic Panel: Recent Labs  Lab 05/18/22 1218 05/18/22 1538 05/19/22 1421  NA 140 139 141  K 4.2 4.1 3.9  CL 105 107 106  CO2  --  23 20*  GLUCOSE 247* 210* 136*  BUN '16 14 23  '$ CREATININE 0.60* 0.76 1.05   CALCIUM  --  8.2* 8.8*    GFR: Estimated Creatinine Clearance: 89.3 mL/min (by C-G formula based on SCr of 1.05 mg/dL). Liver Function Tests: Recent Labs  Lab 05/18/22 1830  AST 22  ALT 22  ALKPHOS 78  BILITOT 0.8  PROT 6.5  ALBUMIN 3.3*    No results for input(s): "LIPASE", "AMYLASE" in the last 168 hours. No results for input(s): "AMMONIA" in the last 168 hours. Coagulation Profile: No results for input(s): "INR", "PROTIME" in the last 168 hours. Cardiac Enzymes: No results for input(s): "CKTOTAL", "CKMB", "CKMBINDEX", "TROPONINI" in the last 168 hours. BNP (last 3 results) No results for input(s): "PROBNP" in the last 8760 hours. HbA1C: Recent Labs    05/18/22 1830  HGBA1C 7.9*    CBG: Recent Labs  Lab 05/20/22 1124 05/20/22 1657 05/20/22 2006 05/21/22 0851 05/21/22 1201  GLUCAP 135* 176* 175* 84 122*    Lipid Profile: No results for input(s): "CHOL", "HDL", "LDLCALC", "TRIG", "CHOLHDL", "LDLDIRECT" in the last 72 hours. Thyroid Function Tests: No results for input(s): "TSH", "T4TOTAL", "FREET4", "T3FREE", "THYROIDAB" in the last 72 hours. Anemia Panel: No results for input(s): "VITAMINB12", "FOLATE", "FERRITIN", "TIBC", "IRON", "RETICCTPCT" in the last 72 hours. Sepsis Labs: No results for input(s): "PROCALCITON", "LATICACIDVEN" in the last 168 hours.  No results found for this or any previous visit (from the past 240 hour(s)).       Radiology Studies: DG Lumbar Spine 1 View  Result Date: 05/20/2022 CLINICAL DATA:  L4 burst fracture EXAM: LUMBAR SPINE - 1 VIEW COMPARISON:  CT 2 days ago. FINDINGS: Slight further collapse of the burst fracture at L4 since the CT scan of 2 days ago. Anterolisthesis of the main vertebral body fragments of 8 mm. This anterolisthesis is presumed secondary to the facet arthropathy shown by CT. IMPRESSION: Slight further collapse of the burst fracture at L4 since the CT scan of 2 days ago. 8 mm of anterolisthesis at L4-5,  presumed secondary to chronic facet arthropathy. Electronically Signed   By: Nelson Chimes M.D.   On: 05/20/2022 10:50        Scheduled Meds:  amLODipine  5 mg Oral Daily   And   benazepril  20 mg Oral Daily   aspirin EC  81 mg Oral Daily   atorvastatin  20 mg Oral Daily   empagliflozin  25 mg Oral Daily   enoxaparin (LOVENOX) injection  40 mg Subcutaneous Q24H   insulin aspart  0-20 Units Subcutaneous TID WC   insulin glargine-yfgn  30 Units Subcutaneous BID   metoprolol succinate  50 mg Oral QHS   pioglitazone  30 mg Oral QHS   Continuous Infusions:   LOS: 2 days    Time spent: 35 minutes    Elara Cocke A Krue Peterka, MD [facility]   If 7PM-7AM, please contact night-coverage [email or web address]  05/21/2022, 1:54 PM

## 2022-05-21 NOTE — TOC Progression Note (Signed)
Transition of Care [facility]) - Progression Note    Patient Details  Name: Dwayne Nelson MRN: [id number] Date of Birth: [birth year]  Transition of Care [facility]) CM/SW [place], [place] Phone Number: 05/21/2022, 1:31 PM  Clinical Narrative:     Bed offers provided to pt at bedside. Pt stating he has a preference for [facility], since he has family he can see there or can visit him. [facility] has not responded, VM left for liaison. Pt states son will review offers this evening. TOC will continue to follow for DC needs.        Expected Discharge Plan and Services                                               Social Determinants of Health (SDOH) Interventions SDOH Screenings   Food Insecurity: No Food Insecurity (05/18/2022)  Housing: Low Risk  (05/18/2022)  Transportation Needs: No Transportation Needs (05/18/2022)  Utilities: Not At Risk (05/18/2022)  Depression (PHQ2-9): Low Risk  (01/22/2022)  Tobacco Use: Low Risk  (05/18/2022)    Readmission Risk Interventions     No data to display

## 2022-05-21 NOTE — Progress Notes (Addendum)
Mobility Specialist Progress Note    05/21/22 1159  Mobility  Activity Dangled on edge of bed  Level of Assistance Moderate assist, patient does 50-74%  Assistive Device Other (Comment) (HHA)  Activity Response Tolerated fair  Mobility Referral Yes  $Mobility charge 1 Mobility   Pt received in bed and agreeable to get up to chair. Once seated EOB pt groaning c/o intense L hip pain. Pt stated he needed to lay down and couldn't tolerate sitting. Returned to supine. Left with call bell in reach.   Hildred Alamin Mobility Specialist  Please Psychologist, sport and exercise or Rehab Office at [phone]

## 2022-05-22 DIAGNOSIS — S32040A Wedge compression fracture of fourth lumbar vertebra, initial encounter for closed fracture: Secondary | ICD-10-CM | POA: Diagnosis not present

## 2022-05-22 DIAGNOSIS — I10 Essential (primary) hypertension: Secondary | ICD-10-CM | POA: Diagnosis not present

## 2022-05-22 DIAGNOSIS — Z794 Long term (current) use of insulin: Secondary | ICD-10-CM | POA: Diagnosis not present

## 2022-05-22 DIAGNOSIS — M545 Low back pain, unspecified: Secondary | ICD-10-CM | POA: Diagnosis not present

## 2022-05-22 DIAGNOSIS — Z7401 Bed confinement status: Secondary | ICD-10-CM | POA: Diagnosis not present

## 2022-05-22 DIAGNOSIS — R531 Weakness: Secondary | ICD-10-CM | POA: Diagnosis not present

## 2022-05-22 DIAGNOSIS — E785 Hyperlipidemia, unspecified: Secondary | ICD-10-CM | POA: Diagnosis not present

## 2022-05-22 DIAGNOSIS — S32008D Other fracture of unspecified lumbar vertebra, subsequent encounter for fracture with routine healing: Secondary | ICD-10-CM | POA: Diagnosis not present

## 2022-05-22 DIAGNOSIS — E1165 Type 2 diabetes mellitus with hyperglycemia: Secondary | ICD-10-CM | POA: Diagnosis not present

## 2022-05-22 DIAGNOSIS — Z7409 Other reduced mobility: Secondary | ICD-10-CM | POA: Diagnosis not present

## 2022-05-22 DIAGNOSIS — E669 Obesity, unspecified: Secondary | ICD-10-CM | POA: Diagnosis not present

## 2022-05-22 DIAGNOSIS — S32040D Wedge compression fracture of fourth lumbar vertebra, subsequent encounter for fracture with routine healing: Secondary | ICD-10-CM | POA: Diagnosis not present

## 2022-05-22 DIAGNOSIS — E782 Mixed hyperlipidemia: Secondary | ICD-10-CM | POA: Diagnosis not present

## 2022-05-22 DIAGNOSIS — Z9181 History of falling: Secondary | ICD-10-CM | POA: Diagnosis not present

## 2022-05-22 DIAGNOSIS — M5416 Radiculopathy, lumbar region: Secondary | ICD-10-CM | POA: Diagnosis not present

## 2022-05-22 DIAGNOSIS — Z4789 Encounter for other orthopedic aftercare: Secondary | ICD-10-CM | POA: Diagnosis not present

## 2022-05-22 DIAGNOSIS — M4316 Spondylolisthesis, lumbar region: Secondary | ICD-10-CM | POA: Diagnosis not present

## 2022-05-22 DIAGNOSIS — W19XXXA Unspecified fall, initial encounter: Secondary | ICD-10-CM | POA: Diagnosis not present

## 2022-05-22 LAB — GLUCOSE, CAPILLARY
Glucose-Capillary: 102 mg/dL — ABNORMAL HIGH (ref 70–99)
Glucose-Capillary: 203 mg/dL — ABNORMAL HIGH (ref 70–99)
Glucose-Capillary: 131 mg/dL — ABNORMAL HIGH (ref 70–99)

## 2022-05-22 MED ORDER — OXYCODONE HCL 5 MG PO TABS: 5.0000 mg | ORAL_TABLET | ORAL | 0 refills | Status: DC | PRN

## 2022-05-22 NOTE — Care Management Important Message (Signed)
Important Message  Patient Details  Name: Dwayne Nelson MRN: [id number] Date of Birth: 10/07/1955   Medicare Important Message Given:  Yes     Hannah Beat 05/22/2022, 11:27 AM

## 2022-05-22 NOTE — Discharge Summary (Signed)
Physician Discharge Summary  Dwayne Nelson [id number] DOB: Jul 08, 1955 DOA: 05/18/2022  PCP: Shirline Frees, MD  Admit date: 05/18/2022 Discharge date: 05/22/2022  Admitted From: Home Disposition: SNF  Recommendations for Outpatient Follow-up:  Follow up with PCP in 1-2 weeks Follow-up with neurosurgery, Dr. Arnoldo Morale 4-6 weeks  Discharge Condition: Stable CODE STATUS: Full code Diet recommendation: Heart healthy/consistent carbohydrate diet  History of present illness:  Dwayne Nelson is a 67 year old male with past medical history significant for type 2 diabetes mellitus, essential hypertension, hyperlipidemia who presented to [facility] ED on 2/24 with progressive lower back pain.  Patient reports mechanical fall 2 days prior to admission.  Workup in the ED, notable for acute superior endplate compression fracture of L4 vertebral body with up to 45% loss of height, mild retropulsion of the superior endplate, nondisplaced fracture of the bilateral L4 pedicles and anterior grade 1 anterolisthesis of L4 on L5.  TRH was consulted for admission and further evaluation and management.  Neurosurgery was consulted who recommended conservative management with pain control.  Discharging to SNF for further rehabilitation.  Outpatient follow-up with neurosurgery 4-6 weeks  Hospital course:  Compression fracture L4 vertebrae Patient was to ED with progressive low back pain and was found to have superior endplate compression fracture of L4 vertebral body.  Patient was seen by neurosurgery, Dr. Arnoldo Morale with recommendations of wearing the lumbosacral corset and conservative treatment with pain control.  Patient was seen by PT and OT who recommended discharge to SNF for further rehabilitation.  Continue oxycodone as needed.  Outpatient follow-up with neurosurgery 4-6 weeks.  Type 2 diabetes mellitus Hemoglobin A1c 7.9, not optimally controlled.  Continue home Jardiance, metformin, Actos, Soliqua.   Outpatient follow-up PCP.  Essential hypertension Continue amlodipine-benazepril 5-20 mg p.o. daily, hydrochlorothiazide 25 mg p.o. daily, metoprolol succinate 50 mg p.o. daily.  Hyperlipidemia Continue atorvastatin 20 mg p.o. daily.  Hypocalcemia, mild Calcium 8.8.  Outpatient follow-up PCP.  Morbid obesity Body mass index is 39.54 kg/m.  Patient needs aggressive lifestyle changes/weight loss as this complicates all facets of care.  Outpatient follow-up with PCP.    Discharge Diagnoses:  Principal Problem:   Closed compression fracture of L4 lumbar vertebra, initial encounter [facility]) Active Problems:   Essential hypertension   Hyperlipidemia   Class 2 obesity   Hypocalcemia   Type 2 diabetes mellitus with hyperglycemia [facility])    Discharge Instructions  Discharge Instructions     Call MD for:  difficulty breathing, headache or visual disturbances   Complete by: As directed    Call MD for:  extreme fatigue   Complete by: As directed    Call MD for:  persistant dizziness or light-headedness   Complete by: As directed    Call MD for:  persistant nausea and vomiting   Complete by: As directed    Call MD for:  severe uncontrolled pain   Complete by: As directed    Call MD for:  temperature >100.4   Complete by: As directed    Diet - low sodium heart healthy   Complete by: As directed    Increase activity slowly   Complete by: As directed       Allergies as of 05/22/2022   No Known Allergies      Medication List     TAKE these medications    amLODipine-benazepril 5-20 MG capsule Commonly known as: LOTREL Take 1 capsule by mouth at bedtime.   aspirin 81 MG chewable tablet Chew 81 mg by  mouth daily.   atorvastatin 20 MG tablet Commonly known as: LIPITOR Take 20 mg by mouth daily.   CENTRUM SILVER ULTRA MENS PO Take 1 tablet by mouth daily with breakfast.   cetirizine 10 MG tablet Commonly known as: ZYRTEC Take 10 mg by mouth daily.   empagliflozin 25  MG Tabs tablet Commonly known as: JARDIANCE Take 25 mg by mouth daily.   hydrochlorothiazide 25 MG tablet Commonly known as: HYDRODIURIL Take 25 mg by mouth daily.   metFORMIN 500 MG 24 hr tablet Commonly known as: GLUCOPHAGE-XR Take 1,000 mg by mouth 2 (two) times daily with a meal.   metoprolol succinate 50 MG 24 hr tablet Commonly known as: TOPROL-XL Take 50 mg by mouth at bedtime. Take with or immediately following a meal.   oxyCODONE 5 MG immediate release tablet Commonly known as: Oxy IR/ROXICODONE Take 1 tablet (5 mg total) by mouth every 4 (four) hours as needed for moderate pain or breakthrough pain.   pioglitazone 30 MG tablet Commonly known as: ACTOS Take 30 mg by mouth at bedtime.   Soliqua 100-33 UNT-MCG/ML Sopn Generic drug: Insulin Glargine-Lixisenatide Inject 30 Units into the skin in the morning and at bedtime.        Follow-up Information     Shirline Frees, MD. Schedule an appointment as soon as possible for a visit in 1 week(s).   Specialty: Family Medicine Contact information: [place] [address] [phone]         Newman Pies, MD. Schedule an appointment as soon as possible for a visit.   Specialty: Neurosurgery Contact information: [address] [place] [address] [phone]                No Known Allergies  Consultations: Neurosurgery, Dr. Arnoldo Morale   Procedures/Studies: DG Lumbar Spine 1 View  Result Date: 05/20/2022 CLINICAL DATA:  L4 burst fracture EXAM: LUMBAR SPINE - 1 VIEW COMPARISON:  CT 2 days ago. FINDINGS: Slight further collapse of the burst fracture at L4 since the CT scan of 2 days ago. Anterolisthesis of the main vertebral body fragments of 8 mm. This anterolisthesis is presumed secondary to the facet arthropathy shown by CT. IMPRESSION: Slight further collapse of the burst fracture at L4 since the CT scan of 2 days ago. 8 mm of anterolisthesis at  L4-5, presumed secondary to chronic facet arthropathy. Electronically Signed   By: Nelson Chimes M.D.   On: 05/20/2022 10:50   DG Ribs Unilateral W/Chest Right  Result Date: 05/18/2022 CLINICAL DATA:  Fall, rib pain EXAM: RIGHT RIBS AND CHEST - 3+ VIEW COMPARISON:  None Available. FINDINGS: No fracture or other bone lesions are seen involving the ribs. There is no evidence of pneumothorax or pleural effusion. Both lungs are clear. Heart size and mediastinal contours are within normal limits. IMPRESSION: Negative. Electronically Signed   By: Davina Poke D.O.   On: 05/18/2022 14:48   CT Lumbar Spine Wo Contrast  Result Date: 05/18/2022 CLINICAL DATA:  Back trauma, no prior imaging (Age >= 16y) EXAM: CT LUMBAR SPINE WITHOUT CONTRAST TECHNIQUE: Multidetector CT imaging of the lumbar spine was performed without intravenous contrast administration. Multiplanar CT image reconstructions were also generated. RADIATION DOSE REDUCTION: This exam was performed according to the departmental dose-optimization program which includes automated exposure control, adjustment of the mA and/or kV according to patient size and/or use of iterative reconstruction technique. COMPARISON:  Same day abdominopelvic CT FINDINGS: Segmentation: 5 lumbar type  vertebrae. Alignment: Grade 1 anterolisthesis of L4 on L5. Vertebrae: Acute superior endplate compression fracture of the L4 vertebral body with up to 45% vertebral body height loss centrally. Mild retropulsion at the superior endplate. Nondisplaced fractures of the bilateral L4 pedicles. Remaining lumbar vertebral body heights are maintained without additional fracture. Facet joints are intact without dislocation. Bridging endplate osteophytes spanning from T9 to L1. Paraspinal and other soft tissues: Aortic atherosclerosis. See dedicated CT abdomen and pelvis for full characterization of the intra-abdominal findings. Disc levels: Moderate lower lumbar facet arthropathy.  Intervertebral disc heights are relatively well preserved. Disc uncovering at L4-L5. Left greater than right foraminal stenosis at L4-L5. Epidural lipomatosis is present from approximately L2 to S1. IMPRESSION: 1. Acute superior endplate compression fracture of the L4 vertebral body with up to 45% vertebral body height loss centrally. Mild retropulsion at the superior endplate. 2. Nondisplaced fractures of the bilateral L4 pedicles. 3. Degenerative grade 1 anterolisthesis of L4 on L5. 4. 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 Aortic Atherosclerosis (ICD10-I70.0). Electronically Signed   By: Davina Poke D.O.   On: 05/18/2022 14:34   CT ABDOMEN PELVIS W CONTRAST  Result Date: 05/18/2022 CLINICAL DATA:  Fall 2 days ago with blunt abdominal trauma. Right-sided abdominal pain. EXAM: CT ABDOMEN AND PELVIS WITH CONTRAST TECHNIQUE: Multidetector CT imaging of the abdomen and pelvis was performed using the standard protocol following bolus administration of intravenous contrast. RADIATION DOSE REDUCTION: This exam was performed according to the departmental dose-optimization program which includes automated exposure control, adjustment of the mA and/or kV according to patient size and/or use of iterative reconstruction technique. CONTRAST:  119m OMNIPAQUE IOHEXOL 300 MG/ML  SOLN COMPARISON:  None Available. FINDINGS: Lower chest:  Unremarkable. Hepatobiliary: No hepatic laceration or mass identified. Gallbladder is unremarkable. No evidence of biliary ductal dilatation. Pancreas: No parenchymal laceration, mass, or inflammatory changes identified. Spleen: No evidence of splenic laceration. Adrenal/Urinary Tract: No hemorrhage or parenchymal lacerations identified. No evidence of suspicious renal masses or hydronephrosis. Stomach/Bowel: Unopacified bowel loops are unremarkable in appearance. No evidence of hemoperitoneum. Normal appendix visualized. Diverticulosis is seen mainly involving the descending  and sigmoid colon, however there is no evidence of diverticulitis. Vascular/Lymphatic: No evidence of abdominal aortic injury. Aortic atherosclerotic calcification incidentally noted. No pathologically enlarged lymph nodes identified. Reproductive:  No mass or other significant abnormality identified. Other: Tiny umbilical hernia, which contains only fat. Small bilateral fat-containing inguinal hernia is also noted. Musculoskeletal: Acute compression fracture of the L4 vertebral body is seen. No evidence of spondylolisthesis. IMPRESSION: Acute compression fracture of L4 vertebral body. No evidence of spondylolisthesis. No evidence of visceral i organ njury or hemoperitoneum. Colonic diverticulosis, without radiographic evidence of diverticulitis. Tiny umbilical and small bilateral inguinal hernias, which contain only fat. Electronically Signed   By: JMarlaine HindM.D.   On: 05/18/2022 14:24     Subjective: Patient seen examined bedside, resting comfortably.  Lying in bed.  Continues with low back pain, moderate control with oral pain medications.  Discharging to SNF today.  Outpatient follow-up with neurosurgery 4-6 weeks.  No other questions or concerns at this time.  Denies headache, no dizziness, no chest pain, no palpitation, no shortness of breath, no abdominal pain, no focal weakness, no fatigue, no cough/congestion, no fever/chills/night sweats, no nausea/vomiting/diarrhea, no paresthesias.  No acute events overnight per nursing staff.  Discharge Exam: Vitals:   05/22/22 0413 05/22/22 0714  BP: 130/85 130/84  Pulse: 64 (!) 55  Resp:  16  Temp: 98.1  F (36.7 C) 98.7 F (37.1 C)  SpO2: 98% 95%   Vitals:   05/21/22 1801 05/21/22 1941 05/22/22 0413 05/22/22 0714  BP: (!) 145/84 (!) 146/80 130/85 130/84  Pulse: 64 61 64 (!) 55  Resp: 17   16  Temp: 98.4 F (36.9 C) 98.7 F (37.1 C) 98.1 F (36.7 C) 98.7 F (37.1 C)  TempSrc: Oral  Oral Oral  SpO2: 95% 97% 98% 95%  Weight:      Height:         Physical Exam: GEN: NAD, alert and oriented x 3, obese HEENT: NCAT, PERRL, EOMI, sclera clear, MMM PULM: CTAB w/o wheezes/crackles, normal respiratory effort, on room air CV: RRR w/o M/G/R GI: abd soft, NTND, NABS, no R/G/M MSK: no peripheral edema, moves all extremities independently NEURO: No focal deficits   The results of significant diagnostics from this hospitalization (including imaging, microbiology, ancillary and laboratory) are listed below for reference.     Microbiology: No results found for this or any previous visit (from the past 240 hour(s)).   Labs: BNP (last 3 results) No results for input(s): "BNP" in the last 8760 hours. Basic Metabolic Panel: Recent Labs  Lab 05/18/22 1218 05/18/22 1538 05/19/22 1421  NA 140 139 141  K 4.2 4.1 3.9  CL 105 107 106  CO2  --  23 20*  GLUCOSE 247* 210* 136*  BUN 16 14 23  $ CREATININE 0.60* 0.76 1.05  CALCIUM  --  8.2* 8.8*   Liver Function Tests: Recent Labs  Lab 05/18/22 1830  AST 22  ALT 22  ALKPHOS 78  BILITOT 0.8  PROT 6.5  ALBUMIN 3.3*   No results for input(s): "LIPASE", "AMYLASE" in the last 168 hours. No results for input(s): "AMMONIA" in the last 168 hours. CBC: Recent Labs  Lab 05/18/22 1218 05/18/22 1538  WBC  --  6.1  NEUTROABS  --  4.6  HGB 13.9 13.7  HCT 41.0 42.5  MCV  --  94.7  PLT  --  184   Cardiac Enzymes: No results for input(s): "CKTOTAL", "CKMB", "CKMBINDEX", "TROPONINI" in the last 168 hours. BNP: Invalid input(s): "POCBNP" CBG: Recent Labs  Lab 05/21/22 0851 05/21/22 1201 05/21/22 1655 05/21/22 2037 05/22/22 0747  GLUCAP 84 122* 145* 181* 102*   D-Dimer No results for input(s): "DDIMER" in the last 72 hours. Hgb A1c No results for input(s): "HGBA1C" in the last 72 hours. Lipid Profile No results for input(s): "CHOL", "HDL", "LDLCALC", "TRIG", "CHOLHDL", "LDLDIRECT" in the last 72 hours. Thyroid function studies No results for input(s): "TSH", "T4TOTAL",  "T3FREE", "THYROIDAB" in the last 72 hours.  Invalid input(s): "FREET3" Anemia work up No results for input(s): "VITAMINB12", "FOLATE", "FERRITIN", "TIBC", "IRON", "RETICCTPCT" in the last 72 hours. Urinalysis    Component Value Date/Time   COLORURINE YELLOW 05/18/2022 [place] 05/18/2022 1614   LABSPEC >1.046 (H) 05/18/2022 1614   PHURINE 5.0 05/18/2022 1614   GLUCOSEU 150 (A) 05/18/2022 1614   HGBUR NEGATIVE 05/18/2022 1614   BILIRUBINUR NEGATIVE 05/18/2022 1614   KETONESUR 80 (A) 05/18/2022 1614   PROTEINUR 100 (A) 05/18/2022 1614   NITRITE NEGATIVE 05/18/2022 1614   LEUKOCYTESUR NEGATIVE 05/18/2022 1614   Sepsis Labs Recent Labs  Lab 05/18/22 1538  WBC 6.1   Microbiology No results found for this or any previous visit (from the past 240 hour(s)).   Time coordinating discharge: Over 30 minutes  SIGNED:   Braya Habermehl J [place] DO  [facility] 05/22/2022, 10:11  AM

## 2022-05-22 NOTE — TOC Progression Note (Addendum)
Transition of Care [facility]) - Progression Note    Patient Details  Name: Dwayne Nelson MRN: [id number] Date of Birth: [birth year]  Transition of Care [facility]) CM/SW [place], [place] Phone Number: 05/22/2022, 10:32 AM  Clinical Narrative:    CSW spoke with pt at bedside who stated his son is currently reviewing options for SNF. Advised of plan to DC today, permission given to call son. CSW spoke with son who is reviewing and will have a choice to CSW before 1PM. Son also made aware of plan to DC today. TOC will continue to follow.     Pt's [name]. Admissions is currently working on another pt, but will notify CSW when they are ready for pt to be transported. TOC will continue to follow.     Expected Discharge Plan and Services         Expected Discharge Date: 05/22/22                                     Social Determinants of Health (SDOH) Interventions SDOH Screenings   Food Insecurity: No Food Insecurity (05/18/2022)  Housing: Low Risk  (05/18/2022)  Transportation Needs: No Transportation Needs (05/18/2022)  Utilities: Not At Risk (05/18/2022)  Depression (PHQ2-9): Low Risk  (01/22/2022)  Tobacco Use: Low Risk  (05/18/2022)    Readmission Risk Interventions     No data to display

## 2022-05-22 NOTE — Progress Notes (Signed)
Patient ID: Dwayne Nelson, male   DOB: [birth year], 67 y.o.   MRN: [id number] Report called to [facility] gray and given to [place] LPN. No further questions at this time.  Haydee Salter, RN

## 2022-05-22 NOTE — TOC Transition Note (Signed)
Transition of Care [facility]) - CM/SW Discharge Note   Patient Details  Name: Dwayne Nelson MRN: [id number] Date of Birth: [birth year]  Transition of Care [facility]) CM/SW Contact:  Coralee Pesa, [place] Phone Number: 05/22/2022, 2:00 PM   Clinical Narrative:    Pt to be transported to [facility] via [place]. Nurse to call report to [phone]. Rm# [phone].    Final next level of care: [place] Barriers to Discharge: Barriers Resolved   Patient Goals and CMS Choice      Discharge Placement                [facility] at: [name] Patient to be transferred to facility by: [name] Name of family member notified: Debra (son) Patient and family notified of of transfer: 05/22/22  Discharge Plan and Services Additional resources added to the After Visit Summary for                                       Social Determinants of Health (SDOH) Interventions SDOH Screenings   Food Insecurity: No Food Insecurity (05/18/2022)  Housing: Low Risk  (05/18/2022)  Transportation Needs: No Transportation Needs (05/18/2022)  Utilities: Not At Risk (05/18/2022)  Depression (PHQ2-9): Low Risk  (01/22/2022)  Tobacco Use: Low Risk  (05/18/2022)     Readmission Risk Interventions     No data to display

## 2022-05-22 NOTE — Plan of Care (Signed)
Patient ID: Dwayne Nelson, male   DOB: 04/12/1955, 67 y.o.   MRN: [id number]   Problem: Education: Goal: Ability to describe self-care measures that may prevent or decrease complications (Diabetes Survival Skills Education) will improve Outcome: Adequate for Discharge Goal: Individualized Educational Video(s) Outcome: Adequate for Discharge   Problem: Coping: Goal: Ability to adjust to condition or change in health will improve Outcome: Adequate for Discharge   Problem: Fluid Volume: Goal: Ability to maintain a balanced intake and output will improve Outcome: Adequate for Discharge   Problem: Health Behavior/Discharge Planning: Goal: Ability to identify and utilize available resources and services will improve Outcome: Adequate for Discharge Goal: Ability to manage health-related needs will improve Outcome: Adequate for Discharge   Problem: Metabolic: Goal: Ability to maintain appropriate glucose levels will improve Outcome: Adequate for Discharge   Problem: Nutritional: Goal: Maintenance of adequate nutrition will improve Outcome: Adequate for Discharge Goal: Progress toward achieving an optimal weight will improve Outcome: Adequate for Discharge   Problem: Skin Integrity: Goal: Risk for impaired skin integrity will decrease Outcome: Adequate for Discharge   Problem: Tissue Perfusion: Goal: Adequacy of tissue perfusion will improve Outcome: Adequate for Discharge   Problem: Education: Goal: Knowledge of General Education information will improve Description: Including pain rating scale, medication(s)/side effects and non-pharmacologic comfort measures Outcome: Adequate for Discharge   Problem: Health Behavior/Discharge Planning: Goal: Ability to manage health-related needs will improve Outcome: Adequate for Discharge   Problem: Clinical Measurements: Goal: Ability to maintain clinical measurements within normal limits will improve Outcome: Adequate for  Discharge Goal: Will remain free from infection Outcome: Adequate for Discharge Goal: Diagnostic test results will improve Outcome: Adequate for Discharge Goal: Respiratory complications will improve Outcome: Adequate for Discharge Goal: Cardiovascular complication will be avoided Outcome: Adequate for Discharge   Problem: Activity: Goal: Risk for activity intolerance will decrease Outcome: Adequate for Discharge   Problem: Nutrition: Goal: Adequate nutrition will be maintained Outcome: Adequate for Discharge   Problem: Coping: Goal: Level of anxiety will decrease Outcome: Adequate for Discharge   Problem: Elimination: Goal: Will not experience complications related to bowel motility Outcome: Adequate for Discharge Goal: Will not experience complications related to urinary retention Outcome: Adequate for Discharge   Problem: Pain Managment: Goal: General experience of comfort will improve Outcome: Adequate for Discharge   Problem: Safety: Goal: Ability to remain free from injury will improve Outcome: Adequate for Discharge   Problem: Skin Integrity: Goal: Risk for impaired skin integrity will decrease Outcome: Adequate for Discharge   Problem: Acute Rehab PT Goals(only PT should resolve) Goal: Pt will Roll Supine to Side Outcome: Adequate for Discharge Goal: Pt Will Go Supine/Side To Sit Outcome: Adequate for Discharge Goal: Pt Will Go Sit To Supine/Side Outcome: Adequate for Discharge Goal: Patient Will Transfer Sit To/From Stand Outcome: Adequate for Discharge Goal: Pt Will Ambulate Outcome: Adequate for Discharge Goal: Pt Will Go Up/Down Stairs Outcome: Adequate for Discharge   Problem: Acute Rehab OT Goals (only OT should resolve) Goal: Pt. Will Perform Lower Body Bathing Outcome: Adequate for Discharge Goal: Pt. Will Perform Lower Body Dressing Outcome: Adequate for Discharge Goal: Pt. Will Transfer To Toilet Outcome: Adequate for Discharge Goal: Pt.  Will Perform Toileting-Clothing Manipulation Outcome: Adequate for Discharge Goal: OT Additional ADL Goal #1 Outcome: Adequate for Discharge Goal: OT Additional ADL Goal #2 Outcome: Adequate for Discharge    Haydee Salter, RN

## 2022-05-23 DIAGNOSIS — E782 Mixed hyperlipidemia: Secondary | ICD-10-CM | POA: Diagnosis not present

## 2022-05-23 DIAGNOSIS — I10 Essential (primary) hypertension: Secondary | ICD-10-CM | POA: Diagnosis not present

## 2022-05-23 DIAGNOSIS — E1165 Type 2 diabetes mellitus with hyperglycemia: Secondary | ICD-10-CM | POA: Diagnosis not present

## 2022-05-24 DIAGNOSIS — E1165 Type 2 diabetes mellitus with hyperglycemia: Secondary | ICD-10-CM | POA: Diagnosis not present

## 2022-05-24 DIAGNOSIS — Z794 Long term (current) use of insulin: Secondary | ICD-10-CM | POA: Diagnosis not present

## 2022-05-24 DIAGNOSIS — I10 Essential (primary) hypertension: Secondary | ICD-10-CM | POA: Diagnosis not present

## 2022-05-24 DIAGNOSIS — E785 Hyperlipidemia, unspecified: Secondary | ICD-10-CM | POA: Diagnosis not present

## 2022-05-24 DIAGNOSIS — S32040D Wedge compression fracture of fourth lumbar vertebra, subsequent encounter for fracture with routine healing: Secondary | ICD-10-CM | POA: Diagnosis not present

## 2022-05-29 DIAGNOSIS — E1165 Type 2 diabetes mellitus with hyperglycemia: Secondary | ICD-10-CM | POA: Diagnosis not present

## 2022-05-29 DIAGNOSIS — Z794 Long term (current) use of insulin: Secondary | ICD-10-CM | POA: Diagnosis not present

## 2022-05-29 DIAGNOSIS — E785 Hyperlipidemia, unspecified: Secondary | ICD-10-CM | POA: Diagnosis not present

## 2022-05-29 DIAGNOSIS — S32040D Wedge compression fracture of fourth lumbar vertebra, subsequent encounter for fracture with routine healing: Secondary | ICD-10-CM | POA: Diagnosis not present

## 2022-05-29 DIAGNOSIS — I10 Essential (primary) hypertension: Secondary | ICD-10-CM | POA: Diagnosis not present

## 2022-05-31 DIAGNOSIS — E785 Hyperlipidemia, unspecified: Secondary | ICD-10-CM | POA: Diagnosis not present

## 2022-05-31 DIAGNOSIS — E1165 Type 2 diabetes mellitus with hyperglycemia: Secondary | ICD-10-CM | POA: Diagnosis not present

## 2022-05-31 DIAGNOSIS — S32040D Wedge compression fracture of fourth lumbar vertebra, subsequent encounter for fracture with routine healing: Secondary | ICD-10-CM | POA: Diagnosis not present

## 2022-05-31 DIAGNOSIS — I10 Essential (primary) hypertension: Secondary | ICD-10-CM | POA: Diagnosis not present

## 2022-06-07 DIAGNOSIS — E785 Hyperlipidemia, unspecified: Secondary | ICD-10-CM | POA: Diagnosis not present

## 2022-06-07 DIAGNOSIS — S32040D Wedge compression fracture of fourth lumbar vertebra, subsequent encounter for fracture with routine healing: Secondary | ICD-10-CM | POA: Diagnosis not present

## 2022-06-07 DIAGNOSIS — E1165 Type 2 diabetes mellitus with hyperglycemia: Secondary | ICD-10-CM | POA: Diagnosis not present

## 2022-06-07 DIAGNOSIS — I10 Essential (primary) hypertension: Secondary | ICD-10-CM | POA: Diagnosis not present

## 2022-06-11 DIAGNOSIS — S32008D Other fracture of unspecified lumbar vertebra, subsequent encounter for fracture with routine healing: Secondary | ICD-10-CM | POA: Diagnosis not present

## 2022-06-11 DIAGNOSIS — M5416 Radiculopathy, lumbar region: Secondary | ICD-10-CM | POA: Diagnosis not present

## 2022-06-20 DIAGNOSIS — Z7982 Long term (current) use of aspirin: Secondary | ICD-10-CM | POA: Diagnosis not present

## 2022-06-20 DIAGNOSIS — Z993 Dependence on wheelchair: Secondary | ICD-10-CM | POA: Diagnosis not present

## 2022-06-20 DIAGNOSIS — Z7984 Long term (current) use of oral hypoglycemic drugs: Secondary | ICD-10-CM | POA: Diagnosis not present

## 2022-06-20 DIAGNOSIS — E1165 Type 2 diabetes mellitus with hyperglycemia: Secondary | ICD-10-CM | POA: Diagnosis not present

## 2022-06-20 DIAGNOSIS — W010XXD Fall on same level from slipping, tripping and stumbling without subsequent striking against object, subsequent encounter: Secondary | ICD-10-CM | POA: Diagnosis not present

## 2022-06-20 DIAGNOSIS — E785 Hyperlipidemia, unspecified: Secondary | ICD-10-CM | POA: Diagnosis not present

## 2022-06-20 DIAGNOSIS — I10 Essential (primary) hypertension: Secondary | ICD-10-CM | POA: Diagnosis not present

## 2022-06-20 DIAGNOSIS — Z794 Long term (current) use of insulin: Secondary | ICD-10-CM | POA: Diagnosis not present

## 2022-06-20 DIAGNOSIS — Z9181 History of falling: Secondary | ICD-10-CM | POA: Diagnosis not present

## 2022-06-20 DIAGNOSIS — S32049D Unspecified fracture of fourth lumbar vertebra, subsequent encounter for fracture with routine healing: Secondary | ICD-10-CM | POA: Diagnosis not present

## 2022-06-20 DIAGNOSIS — Z6830 Body mass index (BMI) 30.0-30.9, adult: Secondary | ICD-10-CM | POA: Diagnosis not present

## 2022-06-21 DIAGNOSIS — W010XXD Fall on same level from slipping, tripping and stumbling without subsequent striking against object, subsequent encounter: Secondary | ICD-10-CM | POA: Diagnosis not present

## 2022-06-21 DIAGNOSIS — E1165 Type 2 diabetes mellitus with hyperglycemia: Secondary | ICD-10-CM | POA: Diagnosis not present

## 2022-06-21 DIAGNOSIS — S32049D Unspecified fracture of fourth lumbar vertebra, subsequent encounter for fracture with routine healing: Secondary | ICD-10-CM | POA: Diagnosis not present

## 2022-06-21 DIAGNOSIS — E785 Hyperlipidemia, unspecified: Secondary | ICD-10-CM | POA: Diagnosis not present

## 2022-06-21 DIAGNOSIS — I10 Essential (primary) hypertension: Secondary | ICD-10-CM | POA: Diagnosis not present

## 2022-06-25 DIAGNOSIS — I10 Essential (primary) hypertension: Secondary | ICD-10-CM | POA: Diagnosis not present

## 2022-06-25 DIAGNOSIS — E1165 Type 2 diabetes mellitus with hyperglycemia: Secondary | ICD-10-CM | POA: Diagnosis not present

## 2022-06-25 DIAGNOSIS — W010XXD Fall on same level from slipping, tripping and stumbling without subsequent striking against object, subsequent encounter: Secondary | ICD-10-CM | POA: Diagnosis not present

## 2022-06-25 DIAGNOSIS — E785 Hyperlipidemia, unspecified: Secondary | ICD-10-CM | POA: Diagnosis not present

## 2022-06-25 DIAGNOSIS — S32049D Unspecified fracture of fourth lumbar vertebra, subsequent encounter for fracture with routine healing: Secondary | ICD-10-CM | POA: Diagnosis not present

## 2022-06-27 DIAGNOSIS — E1165 Type 2 diabetes mellitus with hyperglycemia: Secondary | ICD-10-CM | POA: Diagnosis not present

## 2022-06-27 DIAGNOSIS — I10 Essential (primary) hypertension: Secondary | ICD-10-CM | POA: Diagnosis not present

## 2022-06-27 DIAGNOSIS — S32049D Unspecified fracture of fourth lumbar vertebra, subsequent encounter for fracture with routine healing: Secondary | ICD-10-CM | POA: Diagnosis not present

## 2022-06-27 DIAGNOSIS — E785 Hyperlipidemia, unspecified: Secondary | ICD-10-CM | POA: Diagnosis not present

## 2022-06-27 DIAGNOSIS — W010XXD Fall on same level from slipping, tripping and stumbling without subsequent striking against object, subsequent encounter: Secondary | ICD-10-CM | POA: Diagnosis not present

## 2022-06-28 DIAGNOSIS — S32049D Unspecified fracture of fourth lumbar vertebra, subsequent encounter for fracture with routine healing: Secondary | ICD-10-CM | POA: Diagnosis not present

## 2022-06-28 DIAGNOSIS — W010XXD Fall on same level from slipping, tripping and stumbling without subsequent striking against object, subsequent encounter: Secondary | ICD-10-CM | POA: Diagnosis not present

## 2022-06-28 DIAGNOSIS — E785 Hyperlipidemia, unspecified: Secondary | ICD-10-CM | POA: Diagnosis not present

## 2022-06-28 DIAGNOSIS — E1165 Type 2 diabetes mellitus with hyperglycemia: Secondary | ICD-10-CM | POA: Diagnosis not present

## 2022-06-28 DIAGNOSIS — I10 Essential (primary) hypertension: Secondary | ICD-10-CM | POA: Diagnosis not present

## 2022-07-02 DIAGNOSIS — E785 Hyperlipidemia, unspecified: Secondary | ICD-10-CM | POA: Diagnosis not present

## 2022-07-02 DIAGNOSIS — E1165 Type 2 diabetes mellitus with hyperglycemia: Secondary | ICD-10-CM | POA: Diagnosis not present

## 2022-07-02 DIAGNOSIS — I10 Essential (primary) hypertension: Secondary | ICD-10-CM | POA: Diagnosis not present

## 2022-07-02 DIAGNOSIS — W010XXD Fall on same level from slipping, tripping and stumbling without subsequent striking against object, subsequent encounter: Secondary | ICD-10-CM | POA: Diagnosis not present

## 2022-07-02 DIAGNOSIS — S32049D Unspecified fracture of fourth lumbar vertebra, subsequent encounter for fracture with routine healing: Secondary | ICD-10-CM | POA: Diagnosis not present

## 2022-07-03 DIAGNOSIS — W010XXD Fall on same level from slipping, tripping and stumbling without subsequent striking against object, subsequent encounter: Secondary | ICD-10-CM | POA: Diagnosis not present

## 2022-07-03 DIAGNOSIS — S32049D Unspecified fracture of fourth lumbar vertebra, subsequent encounter for fracture with routine healing: Secondary | ICD-10-CM | POA: Diagnosis not present

## 2022-07-03 DIAGNOSIS — E785 Hyperlipidemia, unspecified: Secondary | ICD-10-CM | POA: Diagnosis not present

## 2022-07-03 DIAGNOSIS — E1165 Type 2 diabetes mellitus with hyperglycemia: Secondary | ICD-10-CM | POA: Diagnosis not present

## 2022-07-03 DIAGNOSIS — I10 Essential (primary) hypertension: Secondary | ICD-10-CM | POA: Diagnosis not present

## 2022-07-05 DIAGNOSIS — I10 Essential (primary) hypertension: Secondary | ICD-10-CM | POA: Diagnosis not present

## 2022-07-05 DIAGNOSIS — E1165 Type 2 diabetes mellitus with hyperglycemia: Secondary | ICD-10-CM | POA: Diagnosis not present

## 2022-07-05 DIAGNOSIS — E785 Hyperlipidemia, unspecified: Secondary | ICD-10-CM | POA: Diagnosis not present

## 2022-07-05 DIAGNOSIS — W010XXD Fall on same level from slipping, tripping and stumbling without subsequent striking against object, subsequent encounter: Secondary | ICD-10-CM | POA: Diagnosis not present

## 2022-07-05 DIAGNOSIS — S32049D Unspecified fracture of fourth lumbar vertebra, subsequent encounter for fracture with routine healing: Secondary | ICD-10-CM | POA: Diagnosis not present

## 2022-07-08 DIAGNOSIS — E1165 Type 2 diabetes mellitus with hyperglycemia: Secondary | ICD-10-CM | POA: Diagnosis not present

## 2022-07-08 DIAGNOSIS — I10 Essential (primary) hypertension: Secondary | ICD-10-CM | POA: Diagnosis not present

## 2022-07-08 DIAGNOSIS — Z0000 Encounter for general adult medical examination without abnormal findings: Secondary | ICD-10-CM | POA: Diagnosis not present

## 2022-07-08 DIAGNOSIS — E291 Testicular hypofunction: Secondary | ICD-10-CM | POA: Diagnosis not present

## 2022-07-08 DIAGNOSIS — Z23 Encounter for immunization: Secondary | ICD-10-CM | POA: Diagnosis not present

## 2022-07-08 DIAGNOSIS — Z125 Encounter for screening for malignant neoplasm of prostate: Secondary | ICD-10-CM | POA: Diagnosis not present

## 2022-07-08 DIAGNOSIS — E782 Mixed hyperlipidemia: Secondary | ICD-10-CM | POA: Diagnosis not present

## 2022-07-09 DIAGNOSIS — S32049D Unspecified fracture of fourth lumbar vertebra, subsequent encounter for fracture with routine healing: Secondary | ICD-10-CM | POA: Diagnosis not present

## 2022-07-09 DIAGNOSIS — E1165 Type 2 diabetes mellitus with hyperglycemia: Secondary | ICD-10-CM | POA: Diagnosis not present

## 2022-07-09 DIAGNOSIS — W010XXD Fall on same level from slipping, tripping and stumbling without subsequent striking against object, subsequent encounter: Secondary | ICD-10-CM | POA: Diagnosis not present

## 2022-07-09 DIAGNOSIS — E785 Hyperlipidemia, unspecified: Secondary | ICD-10-CM | POA: Diagnosis not present

## 2022-07-09 DIAGNOSIS — I10 Essential (primary) hypertension: Secondary | ICD-10-CM | POA: Diagnosis not present

## 2022-07-11 DIAGNOSIS — S32008D Other fracture of unspecified lumbar vertebra, subsequent encounter for fracture with routine healing: Secondary | ICD-10-CM | POA: Diagnosis not present

## 2022-07-17 DIAGNOSIS — E785 Hyperlipidemia, unspecified: Secondary | ICD-10-CM | POA: Diagnosis not present

## 2022-07-17 DIAGNOSIS — I10 Essential (primary) hypertension: Secondary | ICD-10-CM | POA: Diagnosis not present

## 2022-07-17 DIAGNOSIS — S32049D Unspecified fracture of fourth lumbar vertebra, subsequent encounter for fracture with routine healing: Secondary | ICD-10-CM | POA: Diagnosis not present

## 2022-07-17 DIAGNOSIS — E1165 Type 2 diabetes mellitus with hyperglycemia: Secondary | ICD-10-CM | POA: Diagnosis not present

## 2022-07-17 DIAGNOSIS — W010XXD Fall on same level from slipping, tripping and stumbling without subsequent striking against object, subsequent encounter: Secondary | ICD-10-CM | POA: Diagnosis not present

## 2022-07-20 DIAGNOSIS — Z7982 Long term (current) use of aspirin: Secondary | ICD-10-CM | POA: Diagnosis not present

## 2022-07-20 DIAGNOSIS — Z9181 History of falling: Secondary | ICD-10-CM | POA: Diagnosis not present

## 2022-07-20 DIAGNOSIS — Z794 Long term (current) use of insulin: Secondary | ICD-10-CM | POA: Diagnosis not present

## 2022-07-20 DIAGNOSIS — Z993 Dependence on wheelchair: Secondary | ICD-10-CM | POA: Diagnosis not present

## 2022-07-20 DIAGNOSIS — E1165 Type 2 diabetes mellitus with hyperglycemia: Secondary | ICD-10-CM | POA: Diagnosis not present

## 2022-07-20 DIAGNOSIS — E785 Hyperlipidemia, unspecified: Secondary | ICD-10-CM | POA: Diagnosis not present

## 2022-07-20 DIAGNOSIS — Z7984 Long term (current) use of oral hypoglycemic drugs: Secondary | ICD-10-CM | POA: Diagnosis not present

## 2022-07-20 DIAGNOSIS — S32049D Unspecified fracture of fourth lumbar vertebra, subsequent encounter for fracture with routine healing: Secondary | ICD-10-CM | POA: Diagnosis not present

## 2022-07-20 DIAGNOSIS — I10 Essential (primary) hypertension: Secondary | ICD-10-CM | POA: Diagnosis not present

## 2022-07-20 DIAGNOSIS — W010XXD Fall on same level from slipping, tripping and stumbling without subsequent striking against object, subsequent encounter: Secondary | ICD-10-CM | POA: Diagnosis not present

## 2022-07-20 DIAGNOSIS — Z6830 Body mass index (BMI) 30.0-30.9, adult: Secondary | ICD-10-CM | POA: Diagnosis not present

## 2022-07-23 DIAGNOSIS — E785 Hyperlipidemia, unspecified: Secondary | ICD-10-CM | POA: Diagnosis not present

## 2022-07-23 DIAGNOSIS — S32049D Unspecified fracture of fourth lumbar vertebra, subsequent encounter for fracture with routine healing: Secondary | ICD-10-CM | POA: Diagnosis not present

## 2022-07-23 DIAGNOSIS — I10 Essential (primary) hypertension: Secondary | ICD-10-CM | POA: Diagnosis not present

## 2022-07-23 DIAGNOSIS — E1165 Type 2 diabetes mellitus with hyperglycemia: Secondary | ICD-10-CM | POA: Diagnosis not present

## 2022-07-23 DIAGNOSIS — W010XXD Fall on same level from slipping, tripping and stumbling without subsequent striking against object, subsequent encounter: Secondary | ICD-10-CM | POA: Diagnosis not present

## 2022-07-31 DIAGNOSIS — E1165 Type 2 diabetes mellitus with hyperglycemia: Secondary | ICD-10-CM | POA: Diagnosis not present

## 2022-07-31 DIAGNOSIS — W010XXD Fall on same level from slipping, tripping and stumbling without subsequent striking against object, subsequent encounter: Secondary | ICD-10-CM | POA: Diagnosis not present

## 2022-07-31 DIAGNOSIS — S32049D Unspecified fracture of fourth lumbar vertebra, subsequent encounter for fracture with routine healing: Secondary | ICD-10-CM | POA: Diagnosis not present

## 2022-07-31 DIAGNOSIS — E785 Hyperlipidemia, unspecified: Secondary | ICD-10-CM | POA: Diagnosis not present

## 2022-07-31 DIAGNOSIS — I10 Essential (primary) hypertension: Secondary | ICD-10-CM | POA: Diagnosis not present

## 2022-08-06 DIAGNOSIS — I10 Essential (primary) hypertension: Secondary | ICD-10-CM | POA: Diagnosis not present

## 2022-08-06 DIAGNOSIS — E1165 Type 2 diabetes mellitus with hyperglycemia: Secondary | ICD-10-CM | POA: Diagnosis not present

## 2022-08-06 DIAGNOSIS — E785 Hyperlipidemia, unspecified: Secondary | ICD-10-CM | POA: Diagnosis not present

## 2022-08-06 DIAGNOSIS — W010XXD Fall on same level from slipping, tripping and stumbling without subsequent striking against object, subsequent encounter: Secondary | ICD-10-CM | POA: Diagnosis not present

## 2022-08-06 DIAGNOSIS — S32049D Unspecified fracture of fourth lumbar vertebra, subsequent encounter for fracture with routine healing: Secondary | ICD-10-CM | POA: Diagnosis not present

## 2022-08-23 DIAGNOSIS — M4316 Spondylolisthesis, lumbar region: Secondary | ICD-10-CM | POA: Diagnosis not present

## 2022-08-23 DIAGNOSIS — S32008D Other fracture of unspecified lumbar vertebra, subsequent encounter for fracture with routine healing: Secondary | ICD-10-CM | POA: Diagnosis not present

## 2022-08-23 DIAGNOSIS — M48062 Spinal stenosis, lumbar region with neurogenic claudication: Secondary | ICD-10-CM | POA: Diagnosis not present

## 2022-08-30 ENCOUNTER — Other Ambulatory Visit: Payer: Self-pay | Admitting: Neurosurgery

## 2022-08-30 ENCOUNTER — Other Ambulatory Visit (HOSPITAL_COMMUNITY): Payer: Self-pay | Admitting: Neurosurgery

## 2022-08-30 DIAGNOSIS — M4316 Spondylolisthesis, lumbar region: Secondary | ICD-10-CM

## 2022-08-31 ENCOUNTER — Ambulatory Visit (HOSPITAL_COMMUNITY)
Admission: RE | Admit: 2022-08-31 | Discharge: 2022-08-31 | Disposition: A | Discharge status: Home / Self Care | Payer: Medicare Other | Source: Ambulatory Visit | Attending: Neurosurgery | Admitting: Neurosurgery

## 2022-08-31 DIAGNOSIS — M47816 Spondylosis without myelopathy or radiculopathy, lumbar region: Secondary | ICD-10-CM | POA: Diagnosis not present

## 2022-08-31 DIAGNOSIS — M4316 Spondylolisthesis, lumbar region: Secondary | ICD-10-CM | POA: Diagnosis not present

## 2022-09-16 ENCOUNTER — Encounter (HOSPITAL_COMMUNITY): Payer: Self-pay

## 2022-09-16 NOTE — Progress Notes (Signed)
Surgical Instructions    Your procedure is scheduled on Monday, 09/30/22.  Report to [facility] Main Entrance "A" at 10 A.M., then check in with the Admitting office.  Call this number if you have problems the morning of surgery:  [phone]   If you have any questions prior to your surgery date call [phone]: Open Monday-Friday 8am-4pm If you experience any cold or flu symptoms such as cough, fever, chills, shortness of breath, etc. between now and your scheduled surgery, please notify us at the above number     Remember:  Do not eat or drink after midnight Sunday (09/29/22) night.     Take these medicines the morning of surgery with A SIP OF WATER:    acetaminophen (TYLENOL) if needed atorvastatin (LIPITOR)  cetirizine (ZYRTEC)  gabapentin (NEURONTIN)  metoprolol succinate oxyCODONE (OXY IR/ROXICODONE) if needed   ORAL DIABETIC MEDICATION INSTRUCTIONS (Metformin, Jardiance, and Insulin Glargine-Lixisenatide)   Do not take Jardiance - Hold 72 hours prior to procedure.  Last dose will be on Friday, 09/27/22.   Do not take Insulin Glargine on Sunday, 09/29/22 and on Monday, 09/30/22 (day of surgery).  Last dose will be on Saturday, 09/28/22.   The day of your procedure, do not take Metformin.   We will check your blood sugar levels during the admission process and again in Recovery before discharging you home   As of today, STOP taking any Aspirin (unless otherwise instructed by your surgeon) Aleve, Naproxen, Ibuprofen, Motrin, Advil, Goody's, BC's, all herbal medications, fish oil, and all vitamins.           Do not wear jewelry. Do not wear lotions, powders, cologne or deodorant. Men may shave face and neck. Do not bring valuables to the hospital.   [facility] is not responsible for any belongings or valuables.    Do NOT Smoke (Tobacco/Vaping)  24 hours prior to your procedure  If you use a CPAP at night, you may bring your mask for your overnight stay.   Contacts,  glasses, hearing aids, dentures or partials may not be worn into surgery, please bring cases for these belongings   For patients admitted to the hospital, discharge time will be determined by your treatment team.    SURGICAL WAITING ROOM VISITATION Patients having surgery or a procedure may have no more than 2 support people in the waiting area - these visitors may rotate.   Children under the age of 82 must have an adult with them who is not the patient. If the patient needs to stay at the hospital during part of their recovery, the visitor guidelines for inpatient rooms apply. Pre-op nurse will coordinate an appropriate time for 1 support person to accompany patient in pre-op.  This support person may not rotate.   Please refer to [email or web address] for the visitor guidelines for Inpatients (after your surgery is over and you are in a regular room).    Special instructions:    Oral Hygiene is also important to reduce your risk of infection.  Remember - BRUSH YOUR TEETH THE MORNING OF SURGERY WITH YOUR REGULAR TOOTHPASTE

## 2022-09-17 ENCOUNTER — Encounter (HOSPITAL_COMMUNITY)
Admission: RE | Admit: 2022-09-17 | Discharge: 2022-09-17 | Disposition: A | Discharge status: Home / Self Care | Payer: Medicare Other | Source: Ambulatory Visit | Attending: Neurosurgery | Admitting: Neurosurgery

## 2022-09-17 ENCOUNTER — Other Ambulatory Visit: Payer: Self-pay

## 2022-09-17 ENCOUNTER — Encounter (HOSPITAL_COMMUNITY): Payer: Self-pay

## 2022-09-17 VITALS — BP 160/90 | HR 57 | Temp 97.9°F | Resp 18 | Ht 70.0 in | Wt 251.4 lb

## 2022-09-17 DIAGNOSIS — Z79899 Other long term (current) drug therapy: Secondary | ICD-10-CM | POA: Diagnosis not present

## 2022-09-17 DIAGNOSIS — E1165 Type 2 diabetes mellitus with hyperglycemia: Secondary | ICD-10-CM

## 2022-09-17 DIAGNOSIS — I10 Essential (primary) hypertension: Secondary | ICD-10-CM | POA: Insufficient documentation

## 2022-09-17 DIAGNOSIS — Z01812 Encounter for preprocedural laboratory examination: Secondary | ICD-10-CM | POA: Diagnosis not present

## 2022-09-17 DIAGNOSIS — Z01818 Encounter for other preprocedural examination: Secondary | ICD-10-CM

## 2022-09-17 HISTORY — DX: Malignant (primary) neoplasm, unspecified: C80.1

## 2022-09-17 LAB — CBC
HCT: 45.8 % (ref 39.0–52.0)
Hemoglobin: 14.8 g/dL (ref 13.0–17.0)
MCH: 30.7 pg (ref 26.0–34.0)
MCHC: 32.3 g/dL (ref 30.0–36.0)
MCV: 95 fL (ref 80.0–100.0)
Platelets: 234 10*3/uL (ref 150–400)
RBC: 4.82 MIL/uL (ref 4.22–5.81)
RDW: 13.6 % (ref 11.5–15.5)
WBC: 6.6 10*3/uL (ref 4.0–10.5)
nRBC: 0 % (ref 0.0–0.2)

## 2022-09-17 LAB — BASIC METABOLIC PANEL
Anion gap: 9 (ref 5–15)
BUN: 13 mg/dL (ref 8–23)
CO2: 24 mmol/L (ref 22–32)
Calcium: 9 mg/dL (ref 8.9–10.3)
Chloride: 102 mmol/L (ref 98–111)
Creatinine, Ser: 0.88 mg/dL (ref 0.61–1.24)
GFR, Estimated: >60 mL/min (ref >60)
Glucose, Bld: 159 mg/dL — ABNORMAL HIGH (ref 70–99)
Potassium: 3.7 mmol/L (ref 3.5–5.1)
Sodium: 135 mmol/L (ref 135–145)

## 2022-09-17 LAB — SURGICAL PCR SCREEN
MRSA, PCR: POSITIVE — AB
Staphylococcus aureus: POSITIVE — AB

## 2022-09-17 LAB — TYPE AND SCREEN
ABO/RH(D): O POS
Antibody Screen: NEGATIVE

## 2022-09-17 LAB — GLUCOSE, CAPILLARY: Glucose-Capillary: 169 mg/dL — ABNORMAL HIGH (ref 70–99)

## 2022-09-17 NOTE — Progress Notes (Addendum)
PCP - Johny Blamer Cardiologist - Denies  PPM/ICD - Denies  Chest x-ray - N/I EKG - 05/18/22 Stress Test - Denies ECHO - Denies Cardiac Cath - Denies  Sleep Study - Denies CPAP -   DM Type II CBG 169 @ PAT appt fasting Fasting Blood Sugar - 90's Checks Blood Sugar __2-3___ times a day  Blood Thinner Instructions: n/a Aspirin Instructions:Per patient instructed to stop 5 days prior to surgery  ERAS Protcol - No  COVID TEST- N/A   Anesthesia review: No  Patient denies shortness of breath, fever, cough and chest pain at PAT appointment   All instructions explained to the patient, with a verbal understanding of the material. Patient agrees to go over the instructions while at home for a better understanding.  The opportunity to ask questions was provided.  Patient also received handout on 5 CHG baths for instructions.

## 2022-09-17 NOTE — Progress Notes (Addendum)
Received call from lab that patient had positive MRSA pcr swab.  Placed call to Dr. Lovell Sheehan office and left message for [facility], surgical scheduler regarding result.    Also sent an inbox message to Dr. Lovell Sheehan.

## 2022-09-17 NOTE — Progress Notes (Signed)
   09/17/22 1019  OBSTRUCTIVE SLEEP APNEA  Have you ever been diagnosed with sleep apnea through a sleep study? No  Do you snore loudly (loud enough to be heard through closed doors)?  0  Do you often feel tired, fatigued, or sleepy during the daytime (such as falling asleep during driving or talking to someone)? 0  Has anyone observed you stop breathing during your sleep? 0  Do you have, or are you being treated for high blood pressure? 1  BMI more than 35 kg/m2? 1  Age > 50 (1-yes) 1  Male Gender (Yes=1) 1  Obstructive Sleep Apnea Score 4  Score 5 or greater  Results sent to PCP

## 2022-09-19 LAB — HEMOGLOBIN A1C
Hgb A1c MFr Bld: 7.5 % — ABNORMAL HIGH (ref 4.8–5.6)
Mean Plasma Glucose: 169 mg/dL

## 2022-09-30 ENCOUNTER — Other Ambulatory Visit: Payer: Self-pay

## 2022-09-30 ENCOUNTER — Ambulatory Visit (HOSPITAL_COMMUNITY)
Admission: RE | Admit: 2022-09-30 | Discharge: 2022-10-01 | Disposition: A | Discharge status: Home / Self Care | Payer: Medicare Other | Source: Ambulatory Visit | Attending: Neurosurgery | Admitting: Neurosurgery

## 2022-09-30 ENCOUNTER — Encounter (HOSPITAL_COMMUNITY): Payer: Self-pay | Admitting: Neurosurgery

## 2022-09-30 ENCOUNTER — Ambulatory Visit (HOSPITAL_COMMUNITY)
Admission: RE | Disposition: A | Discharge status: Home / Self Care | Payer: Self-pay | Source: Ambulatory Visit | Attending: Neurosurgery

## 2022-09-30 ENCOUNTER — Ambulatory Visit (HOSPITAL_COMMUNITY): Payer: Medicare Other

## 2022-09-30 ENCOUNTER — Ambulatory Visit (HOSPITAL_BASED_OUTPATIENT_CLINIC_OR_DEPARTMENT_OTHER): Payer: Medicare Other | Admitting: Anesthesiology

## 2022-09-30 ENCOUNTER — Ambulatory Visit (HOSPITAL_COMMUNITY): Payer: Medicare Other | Admitting: Anesthesiology

## 2022-09-30 DIAGNOSIS — Z7984 Long term (current) use of oral hypoglycemic drugs: Secondary | ICD-10-CM | POA: Diagnosis not present

## 2022-09-30 DIAGNOSIS — Z9889 Other specified postprocedural states: Secondary | ICD-10-CM | POA: Diagnosis not present

## 2022-09-30 DIAGNOSIS — M4316 Spondylolisthesis, lumbar region: Secondary | ICD-10-CM

## 2022-09-30 DIAGNOSIS — Z8781 Personal history of (healed) traumatic fracture: Secondary | ICD-10-CM | POA: Insufficient documentation

## 2022-09-30 DIAGNOSIS — M48062 Spinal stenosis, lumbar region with neurogenic claudication: Secondary | ICD-10-CM | POA: Diagnosis not present

## 2022-09-30 DIAGNOSIS — M4726 Other spondylosis with radiculopathy, lumbar region: Secondary | ICD-10-CM | POA: Diagnosis not present

## 2022-09-30 DIAGNOSIS — Z794 Long term (current) use of insulin: Secondary | ICD-10-CM | POA: Insufficient documentation

## 2022-09-30 DIAGNOSIS — I10 Essential (primary) hypertension: Secondary | ICD-10-CM

## 2022-09-30 DIAGNOSIS — E1165 Type 2 diabetes mellitus with hyperglycemia: Secondary | ICD-10-CM | POA: Diagnosis not present

## 2022-09-30 DIAGNOSIS — E785 Hyperlipidemia, unspecified: Secondary | ICD-10-CM | POA: Diagnosis not present

## 2022-09-30 DIAGNOSIS — Z981 Arthrodesis status: Secondary | ICD-10-CM | POA: Diagnosis not present

## 2022-09-30 DIAGNOSIS — Z79899 Other long term (current) drug therapy: Secondary | ICD-10-CM | POA: Insufficient documentation

## 2022-09-30 LAB — GLUCOSE, CAPILLARY
Glucose-Capillary: 188 mg/dL — ABNORMAL HIGH (ref 70–99)
Glucose-Capillary: 163 mg/dL — ABNORMAL HIGH (ref 70–99)
Glucose-Capillary: 197 mg/dL — ABNORMAL HIGH (ref 70–99)
Glucose-Capillary: 282 mg/dL — ABNORMAL HIGH (ref 70–99)

## 2022-09-30 SURGERY — POSTERIOR LUMBAR FUSION 1 LEVEL
Anesthesia: General | Site: Back

## 2022-09-30 MED ORDER — BUPIVACAINE-EPINEPHRINE (PF) 0.25% -1:200000 IJ SOLN
INTRAMUSCULAR | Status: DC | PRN
  Administered 2022-09-30: 10 mL via PERINEURAL

## 2022-09-30 MED ORDER — AMISULPRIDE (ANTIEMETIC) 5 MG/2ML IV SOLN: 10.0000 mg | Freq: Once | INTRAVENOUS | Status: DC | PRN

## 2022-09-30 MED ORDER — ROCURONIUM BROMIDE 10 MG/ML (PF) SYRINGE
PREFILLED_SYRINGE | INTRAVENOUS | Status: DC | PRN
  Administered 2022-09-30: 60 mg via INTRAVENOUS
  Administered 2022-09-30: 40 mg via INTRAVENOUS

## 2022-09-30 MED ORDER — CEFAZOLIN SODIUM-DEXTROSE 2-4 GM/100ML-% IV SOLN: 2.0000 g | INTRAVENOUS | Status: DC

## 2022-09-30 MED ORDER — OXYCODONE HCL 5 MG PO TABS: 5.0000 mg | ORAL_TABLET | ORAL | Status: DC | PRN

## 2022-09-30 MED ORDER — ACETAMINOPHEN 500 MG PO TABS
1000.0000 mg | ORAL_TABLET | Freq: Four times a day (QID) | ORAL | Status: DC
  Administered 2022-09-30 – 2022-10-01 (×3): 1000 mg via ORAL
  Filled 2022-09-30 (×3): qty 2

## 2022-09-30 MED ORDER — SODIUM CHLORIDE 0.9% FLUSH: 3.0000 mL | Freq: Two times a day (BID) | INTRAVENOUS | Status: DC

## 2022-09-30 MED ORDER — FENTANYL CITRATE (PF) 250 MCG/5ML IJ SOLN
INTRAMUSCULAR | Status: AC
  Filled 2022-09-30: qty 5

## 2022-09-30 MED ORDER — MIDAZOLAM HCL 2 MG/2ML IJ SOLN
INTRAMUSCULAR | Status: AC
  Filled 2022-09-30: qty 2

## 2022-09-30 MED ORDER — BACITRACIN ZINC 500 UNIT/GM EX OINT
TOPICAL_OINTMENT | CUTANEOUS | Status: DC | PRN
  Administered 2022-09-30: 1 via TOPICAL

## 2022-09-30 MED ORDER — OXYCODONE HCL 5 MG/5ML PO SOLN: 5.0000 mg | Freq: Once | ORAL | Status: DC | PRN

## 2022-09-30 MED ORDER — EMPAGLIFLOZIN 25 MG PO TABS
25.0000 mg | ORAL_TABLET | Freq: Every day | ORAL | Status: DC
  Administered 2022-09-30 – 2022-10-01 (×2): 25 mg via ORAL
  Filled 2022-09-30 (×2): qty 1

## 2022-09-30 MED ORDER — ACETAMINOPHEN 650 MG RE SUPP: 650.0000 mg | RECTAL | Status: DC | PRN

## 2022-09-30 MED ORDER — PIOGLITAZONE HCL 15 MG PO TABS
30.0000 mg | ORAL_TABLET | Freq: Every day | ORAL | Status: DC
  Administered 2022-09-30: 30 mg via ORAL
  Filled 2022-09-30: qty 2

## 2022-09-30 MED ORDER — BENAZEPRIL HCL 20 MG PO TABS
20.0000 mg | ORAL_TABLET | Freq: Every day | ORAL | Status: DC
  Administered 2022-09-30 – 2022-10-01 (×2): 20 mg via ORAL
  Filled 2022-09-30 (×2): qty 1

## 2022-09-30 MED ORDER — MORPHINE SULFATE (PF) 4 MG/ML IV SOLN: 4.0000 mg | INTRAVENOUS | Status: DC | PRN

## 2022-09-30 MED ORDER — PROPOFOL 10 MG/ML IV BOLUS
INTRAVENOUS | Status: AC
  Filled 2022-09-30: qty 20

## 2022-09-30 MED ORDER — ROCURONIUM BROMIDE 10 MG/ML (PF) SYRINGE
PREFILLED_SYRINGE | INTRAVENOUS | Status: AC
  Filled 2022-09-30: qty 10

## 2022-09-30 MED ORDER — BUPIVACAINE-EPINEPHRINE (PF) 0.25% -1:200000 IJ SOLN
INTRAMUSCULAR | Status: AC
  Filled 2022-09-30: qty 30

## 2022-09-30 MED ORDER — SURGIRINSE WOUND IRRIGATION SYSTEM - OPTIME: TOPICAL | Status: DC | PRN

## 2022-09-30 MED ORDER — INSULIN ASPART 100 UNIT/ML IJ SOLN
INTRAMUSCULAR | Status: AC
  Filled 2022-09-30: qty 1

## 2022-09-30 MED ORDER — MIDAZOLAM HCL 2 MG/2ML IJ SOLN
INTRAMUSCULAR | Status: DC | PRN
  Administered 2022-09-30: 2 mg via INTRAVENOUS

## 2022-09-30 MED ORDER — AMLODIPINE BESYLATE 5 MG PO TABS
5.0000 mg | ORAL_TABLET | Freq: Every day | ORAL | Status: DC
  Administered 2022-09-30 – 2022-10-01 (×2): 5 mg via ORAL
  Filled 2022-09-30 (×2): qty 1

## 2022-09-30 MED ORDER — GABAPENTIN 300 MG PO CAPS
300.0000 mg | ORAL_CAPSULE | Freq: Three times a day (TID) | ORAL | Status: DC
  Administered 2022-09-30 – 2022-10-01 (×2): 300 mg via ORAL
  Filled 2022-09-30 (×2): qty 1

## 2022-09-30 MED ORDER — ONDANSETRON HCL 4 MG/2ML IJ SOLN: 4.0000 mg | Freq: Four times a day (QID) | INTRAMUSCULAR | Status: DC | PRN

## 2022-09-30 MED ORDER — PROPOFOL 10 MG/ML IV BOLUS
INTRAVENOUS | Status: DC | PRN
  Administered 2022-09-30: 150 mg via INTRAVENOUS

## 2022-09-30 MED ORDER — ORAL CARE MOUTH RINSE: 15.0000 mL | Freq: Once | OROMUCOSAL | Status: AC

## 2022-09-30 MED ORDER — THROMBIN 5000 UNITS EX SOLR
CUTANEOUS | Status: AC
  Filled 2022-09-30: qty 5000

## 2022-09-30 MED ORDER — TESTOSTERONE 30 MG/ACT TD SOLN: 2.0000 | Freq: Every day | TRANSDERMAL | Status: DC

## 2022-09-30 MED ORDER — CHLORHEXIDINE GLUCONATE CLOTH 2 % EX PADS: 6.0000 | MEDICATED_PAD | Freq: Once | CUTANEOUS | Status: DC

## 2022-09-30 MED ORDER — DEXAMETHASONE SODIUM PHOSPHATE 10 MG/ML IJ SOLN
INTRAMUSCULAR | Status: DC | PRN
  Administered 2022-09-30: 10 mg via INTRAVENOUS

## 2022-09-30 MED ORDER — CLINDAMYCIN PHOSPHATE 900 MG/50ML IV SOLN
900.0000 mg | Freq: Once | INTRAVENOUS | Status: AC
  Administered 2022-09-30: 900 mg via INTRAVENOUS

## 2022-09-30 MED ORDER — THROMBIN 5000 UNITS EX SOLR: OROMUCOSAL | Status: DC | PRN

## 2022-09-30 MED ORDER — DOCUSATE SODIUM 100 MG PO CAPS
100.0000 mg | ORAL_CAPSULE | Freq: Two times a day (BID) | ORAL | Status: DC
  Administered 2022-09-30 – 2022-10-01 (×2): 100 mg via ORAL
  Filled 2022-09-30 (×2): qty 1

## 2022-09-30 MED ORDER — PROPOFOL 500 MG/50ML IV EMUL
INTRAVENOUS | Status: DC | PRN
  Administered 2022-09-30: 50 ug/kg/min via INTRAVENOUS

## 2022-09-30 MED ORDER — VANCOMYCIN HCL 1500 MG/300ML IV SOLN
1500.0000 mg | Freq: Once | INTRAVENOUS | Status: DC
  Filled 2022-09-30: qty 300

## 2022-09-30 MED ORDER — ACETAMINOPHEN 325 MG PO TABS: 650.0000 mg | ORAL_TABLET | ORAL | Status: DC | PRN

## 2022-09-30 MED ORDER — CEFAZOLIN SODIUM-DEXTROSE 2-4 GM/100ML-% IV SOLN
2.0000 g | Freq: Three times a day (TID) | INTRAVENOUS | Status: AC
  Administered 2022-09-30 – 2022-10-01 (×2): 2 g via INTRAVENOUS
  Filled 2022-09-30 (×2): qty 100

## 2022-09-30 MED ORDER — CLINDAMYCIN PHOSPHATE 900 MG/50ML IV SOLN
INTRAVENOUS | Status: AC
  Filled 2022-09-30: qty 50

## 2022-09-30 MED ORDER — METFORMIN HCL ER 500 MG PO TB24
1000.0000 mg | ORAL_TABLET | Freq: Two times a day (BID) | ORAL | Status: DC
  Administered 2022-10-01: 1000 mg via ORAL
  Filled 2022-09-30: qty 2

## 2022-09-30 MED ORDER — 0.9 % SODIUM CHLORIDE (POUR BTL) OPTIME
TOPICAL | Status: DC | PRN
  Administered 2022-09-30: 1000 mL

## 2022-09-30 MED ORDER — CYCLOBENZAPRINE HCL 10 MG PO TABS
10.0000 mg | ORAL_TABLET | Freq: Three times a day (TID) | ORAL | Status: DC | PRN
  Administered 2022-09-30: 10 mg via ORAL
  Filled 2022-09-30: qty 1

## 2022-09-30 MED ORDER — METOPROLOL SUCCINATE ER 50 MG PO TB24
50.0000 mg | ORAL_TABLET | Freq: Every day | ORAL | Status: DC
  Administered 2022-09-30: 50 mg via ORAL
  Filled 2022-09-30: qty 1

## 2022-09-30 MED ORDER — BUPIVACAINE LIPOSOME 1.3 % IJ SUSP
INTRAMUSCULAR | Status: DC | PRN
  Administered 2022-09-30: 20 mL

## 2022-09-30 MED ORDER — ONDANSETRON HCL 4 MG/2ML IJ SOLN
INTRAMUSCULAR | Status: DC | PRN
  Administered 2022-09-30: 4 mg via INTRAVENOUS

## 2022-09-30 MED ORDER — OXYCODONE HCL 5 MG PO TABS: 5.0000 mg | ORAL_TABLET | Freq: Once | ORAL | Status: DC | PRN

## 2022-09-30 MED ORDER — VANCOMYCIN HCL IN DEXTROSE 1-5 GM/200ML-% IV SOLN
INTRAVENOUS | Status: AC
  Filled 2022-09-30: qty 200

## 2022-09-30 MED ORDER — PHENOL 1.4 % MT LIQD: 1.0000 | OROMUCOSAL | Status: DC | PRN

## 2022-09-30 MED ORDER — FENTANYL CITRATE (PF) 250 MCG/5ML IJ SOLN
INTRAMUSCULAR | Status: DC | PRN
  Administered 2022-09-30: 100 ug via INTRAVENOUS
  Administered 2022-09-30 (×3): 50 ug via INTRAVENOUS

## 2022-09-30 MED ORDER — PHENYLEPHRINE HCL-NACL 20-0.9 MG/250ML-% IV SOLN
INTRAVENOUS | Status: DC | PRN
  Administered 2022-09-30: 20 ug/min via INTRAVENOUS
  Administered 2022-09-30: 80 ug via INTRAVENOUS
  Administered 2022-09-30: 160 ug via INTRAVENOUS

## 2022-09-30 MED ORDER — HYDROMORPHONE HCL 1 MG/ML IJ SOLN: 0.2500 mg | INTRAMUSCULAR | Status: DC | PRN

## 2022-09-30 MED ORDER — ALBUMIN HUMAN 5 % IV SOLN: INTRAVENOUS | Status: DC | PRN

## 2022-09-30 MED ORDER — BACITRACIN ZINC 500 UNIT/GM EX OINT
TOPICAL_OINTMENT | CUTANEOUS | Status: AC
  Filled 2022-09-30: qty 28.35

## 2022-09-30 MED ORDER — LIDOCAINE 2% (20 MG/ML) 5 ML SYRINGE
INTRAMUSCULAR | Status: DC | PRN
  Administered 2022-09-30: 100 mg via INTRAVENOUS

## 2022-09-30 MED ORDER — KETAMINE HCL 50 MG/5ML IJ SOSY
PREFILLED_SYRINGE | INTRAMUSCULAR | Status: AC
  Filled 2022-09-30: qty 5

## 2022-09-30 MED ORDER — VANCOMYCIN HCL 1500 MG/300ML IV SOLN
1500.0000 mg | Freq: Once | INTRAVENOUS | Status: AC
  Administered 2022-09-30: 1500 mg via INTRAVENOUS
  Filled 2022-09-30 (×2): qty 300

## 2022-09-30 MED ORDER — BISACODYL 10 MG RE SUPP: 10.0000 mg | Freq: Every day | RECTAL | Status: DC | PRN

## 2022-09-30 MED ORDER — ATORVASTATIN CALCIUM 10 MG PO TABS
20.0000 mg | ORAL_TABLET | Freq: Every day | ORAL | Status: DC
  Administered 2022-10-01: 20 mg via ORAL
  Filled 2022-09-30: qty 2

## 2022-09-30 MED ORDER — KETAMINE HCL 10 MG/ML IJ SOLN
INTRAMUSCULAR | Status: DC | PRN
  Administered 2022-09-30 (×2): 10 mg via INTRAVENOUS
  Administered 2022-09-30: 30 mg via INTRAVENOUS

## 2022-09-30 MED ORDER — EPHEDRINE SULFATE (PRESSORS) 50 MG/ML IJ SOLN
INTRAMUSCULAR | Status: DC | PRN
  Administered 2022-09-30 (×3): 10 mg via INTRAVENOUS
  Administered 2022-09-30: 5 mg via INTRAVENOUS

## 2022-09-30 MED ORDER — ACETAMINOPHEN 500 MG PO TABS
1000.0000 mg | ORAL_TABLET | Freq: Once | ORAL | Status: AC
  Administered 2022-09-30: 1000 mg via ORAL
  Filled 2022-09-30: qty 2

## 2022-09-30 MED ORDER — BUPIVACAINE LIPOSOME 1.3 % IJ SUSP
INTRAMUSCULAR | Status: AC
  Filled 2022-09-30: qty 20

## 2022-09-30 MED ORDER — OXYCODONE HCL 5 MG PO TABS
10.0000 mg | ORAL_TABLET | ORAL | Status: DC | PRN
  Administered 2022-09-30 – 2022-10-01 (×3): 10 mg via ORAL
  Filled 2022-09-30 (×5): qty 2

## 2022-09-30 MED ORDER — LORATADINE 10 MG PO TABS
10.0000 mg | ORAL_TABLET | Freq: Every day | ORAL | Status: DC
  Administered 2022-09-30 – 2022-10-01 (×2): 10 mg via ORAL
  Filled 2022-09-30 (×2): qty 1

## 2022-09-30 MED ORDER — HYDROCHLOROTHIAZIDE 25 MG PO TABS
25.0000 mg | ORAL_TABLET | Freq: Every day | ORAL | Status: DC
  Administered 2022-09-30 – 2022-10-01 (×2): 25 mg via ORAL
  Filled 2022-09-30 (×2): qty 1

## 2022-09-30 MED ORDER — CLINDAMYCIN PHOSPHATE 900 MG/50ML IV SOLN
900.0000 mg | Freq: Three times a day (TID) | INTRAVENOUS | Status: AC
  Administered 2022-09-30: 900 mg via INTRAVENOUS
  Filled 2022-09-30 (×2): qty 50

## 2022-09-30 MED ORDER — SODIUM CHLORIDE 0.9% FLUSH: 3.0000 mL | INTRAVENOUS | Status: DC | PRN

## 2022-09-30 MED ORDER — SUGAMMADEX SODIUM 200 MG/2ML IV SOLN
INTRAVENOUS | Status: DC | PRN
  Administered 2022-09-30: 228 mg via INTRAVENOUS

## 2022-09-30 MED ORDER — ONDANSETRON HCL 4 MG PO TABS: 4.0000 mg | ORAL_TABLET | Freq: Four times a day (QID) | ORAL | Status: DC | PRN

## 2022-09-30 MED ORDER — LACTATED RINGERS IV SOLN: INTRAVENOUS | Status: DC

## 2022-09-30 MED ORDER — LIDOCAINE 2% (20 MG/ML) 5 ML SYRINGE
INTRAMUSCULAR | Status: AC
  Filled 2022-09-30: qty 5

## 2022-09-30 MED ORDER — SODIUM CHLORIDE 0.9 % IV SOLN
250.0000 mL | INTRAVENOUS | Status: DC
  Administered 2022-09-30: 250 mL via INTRAVENOUS

## 2022-09-30 MED ORDER — INSULIN ASPART 100 UNIT/ML IJ SOLN
0.0000 [IU] | Freq: Three times a day (TID) | INTRAMUSCULAR | Status: DC
  Administered 2022-10-01: 4 [IU] via SUBCUTANEOUS

## 2022-09-30 MED ORDER — INSULIN ASPART 100 UNIT/ML IJ SOLN
0.0000 [IU] | INTRAMUSCULAR | Status: AC | PRN
  Administered 2022-09-30: 4 [IU] via SUBCUTANEOUS
  Administered 2022-09-30: 2 [IU] via SUBCUTANEOUS

## 2022-09-30 MED ORDER — CHLORHEXIDINE GLUCONATE 0.12 % MT SOLN
15.0000 mL | Freq: Once | OROMUCOSAL | Status: AC
  Administered 2022-09-30: 15 mL via OROMUCOSAL
  Filled 2022-09-30: qty 15

## 2022-09-30 MED ORDER — MENTHOL 3 MG MT LOZG: 1.0000 | LOZENGE | OROMUCOSAL | Status: DC | PRN

## 2022-09-30 SURGICAL SUPPLY — 68 items
APL SKNCLS STERI-STRIP NONHPOA (GAUZE/BANDAGES/DRESSINGS) ×1
BAG COUNTER SPONGE SURGICOUNT (BAG) ×1 IMPLANT
BAG SPNG CNTER NS LX DISP (BAG) ×1
BASKET BONE COLLECTION (BASKET) ×1 IMPLANT
BENZOIN TINCTURE PRP APPL 2/3 (GAUZE/BANDAGES/DRESSINGS) ×1 IMPLANT
BLADE CLIPPER SURG (BLADE) IMPLANT
BUR MATCHSTICK NEURO 3.0 LAGG (BURR) ×1 IMPLANT
BUR PRECISION FLUTE 6.0 (BURR) ×1 IMPLANT
CANISTER SUCT 3000ML PPV (MISCELLANEOUS) ×1 IMPLANT
CAP LOCK DLX THRD (Cap) IMPLANT
CNTNR URN SCR LID CUP LEK RST (MISCELLANEOUS) ×1 IMPLANT
CONT SPEC 4OZ STRL OR WHT (MISCELLANEOUS) ×1
COVER BACK TABLE 60X90IN (DRAPES) ×1 IMPLANT
DRAPE C-ARM 42X72 X-RAY (DRAPES) ×2 IMPLANT
DRAPE HALF SHEET 40X57 (DRAPES) ×1 IMPLANT
DRAPE LAPAROTOMY 100X72X124 (DRAPES) ×1 IMPLANT
DRAPE SURG 17X23 STRL (DRAPES) ×4 IMPLANT
DRSG OPSITE POSTOP 4X6 (GAUZE/BANDAGES/DRESSINGS) ×1 IMPLANT
ELECT BLADE 4.0 EZ CLEAN MEGAD (MISCELLANEOUS) ×1
ELECT REM PT RETURN 9FT ADLT (ELECTROSURGICAL) ×1
ELECTRODE BLDE 4.0 EZ CLN MEGD (MISCELLANEOUS) ×1 IMPLANT
ELECTRODE REM PT RTRN 9FT ADLT (ELECTROSURGICAL) ×1 IMPLANT
EVACUATOR 1/8 PVC DRAIN (DRAIN) IMPLANT
GAUZE 4X4 16PLY ~~LOC~~+RFID DBL (SPONGE) ×1 IMPLANT
GLOVE BIO SURGEON STRL SZ 6 (GLOVE) ×1 IMPLANT
GLOVE BIO SURGEON STRL SZ8 (GLOVE) ×2 IMPLANT
GLOVE BIO SURGEON STRL SZ8.5 (GLOVE) ×2 IMPLANT
GLOVE BIOGEL PI IND STRL 6.5 (GLOVE) ×1 IMPLANT
GLOVE EXAM NITRILE XL STR (GLOVE) IMPLANT
GOWN STRL REUS W/ TWL LRG LVL3 (GOWN DISPOSABLE) ×1 IMPLANT
GOWN STRL REUS W/ TWL XL LVL3 (GOWN DISPOSABLE) ×2 IMPLANT
GOWN STRL REUS W/TWL 2XL LVL3 (GOWN DISPOSABLE) IMPLANT
GOWN STRL REUS W/TWL LRG LVL3 (GOWN DISPOSABLE) ×1
GOWN STRL REUS W/TWL XL LVL3 (GOWN DISPOSABLE) ×2
HEMOSTAT POWDER KIT SURGIFOAM (HEMOSTASIS) ×1 IMPLANT
KIT BASIN OR (CUSTOM PROCEDURE TRAY) ×1 IMPLANT
KIT GRAFTMAG DEL NEURO DISP (NEUROSURGERY SUPPLIES) IMPLANT
KIT POSITION SURG JACKSON T1 (MISCELLANEOUS) ×1 IMPLANT
KIT TURNOVER KIT B (KITS) ×1 IMPLANT
NDL HYPO 21X1.5 SAFETY (NEEDLE) IMPLANT
NDL HYPO 22X1.5 SAFETY MO (MISCELLANEOUS) ×1 IMPLANT
NEEDLE HYPO 21X1.5 SAFETY (NEEDLE) IMPLANT
NEEDLE HYPO 22X1.5 SAFETY MO (MISCELLANEOUS) ×1 IMPLANT
NS IRRIG 1000ML POUR BTL (IV SOLUTION) ×1 IMPLANT
PACK LAMINECTOMY NEURO (CUSTOM PROCEDURE TRAY) ×1 IMPLANT
PAD ARMBOARD 7.5X6 YLW CONV (MISCELLANEOUS) ×3 IMPLANT
PATTIES SURGICAL .5 X1 (DISPOSABLE) IMPLANT
PUTTY DBM INSTAFILL CART 5CC (Putty) IMPLANT
ROD CURVED TI 6.35X45 (Rod) IMPLANT
SCREW PA DLX CREO 7.5X50 (Screw) IMPLANT
SCREW PA DLX CREO 7.5X55 (Screw) IMPLANT
SOL ELECTROSURG ANTI STICK (MISCELLANEOUS) ×1
SOLUTION ELECTROSURG ANTI STCK (MISCELLANEOUS) ×1 IMPLANT
SOLUTION IRRIG SURGIPHOR (IV SOLUTION) ×1 IMPLANT
SPACER ALTERA 10X31-15 (Spacer) IMPLANT
SPIKE FLUID TRANSFER (MISCELLANEOUS) ×1 IMPLANT
SPONGE NEURO XRAY DETECT 1X3 (DISPOSABLE) IMPLANT
SPONGE SURGIFOAM ABS GEL 100 (HEMOSTASIS) IMPLANT
SPONGE T-LAP 4X18 ~~LOC~~+RFID (SPONGE) IMPLANT
STRIP CLOSURE SKIN 1/2X4 (GAUZE/BANDAGES/DRESSINGS) ×1 IMPLANT
SUT VIC AB 1 CT1 18XBRD ANBCTR (SUTURE) ×2 IMPLANT
SUT VIC AB 1 CT1 8-18 (SUTURE) ×2
SUT VIC AB 2-0 CP2 18 (SUTURE) ×2 IMPLANT
SYR 20ML LL LF (SYRINGE) IMPLANT
TOWEL GREEN STERILE (TOWEL DISPOSABLE) ×1 IMPLANT
TOWEL GREEN STERILE FF (TOWEL DISPOSABLE) ×1 IMPLANT
TRAY FOLEY MTR SLVR 16FR STAT (SET/KITS/TRAYS/PACK) ×1 IMPLANT
WATER STERILE IRR 1000ML POUR (IV SOLUTION) ×1 IMPLANT

## 2022-09-30 NOTE — Anesthesia Procedure Notes (Signed)
Procedure Name: Intubation Date/Time: 09/30/2022 12:12 PM  Performed by: Colon Flattery, CRNAPre-anesthesia Checklist: Patient identified, Emergency Drugs available, Suction available and Patient being monitored Patient Re-evaluated:Patient Re-evaluated prior to induction Oxygen Delivery Method: Circle system utilized Preoxygenation: Pre-oxygenation with 100% oxygen Induction Type: IV induction Ventilation: Mask ventilation without difficulty Laryngoscope Size: Mac and 4 Grade View: Grade I Tube type: Oral Tube size: 7.5 mm Number of attempts: 1 Airway Equipment and Method: Stylet and Oral airway Placement Confirmation: ETT inserted through vocal cords under direct vision, positive ETCO2 and breath sounds checked- equal and bilateral Secured at: 22 cm Tube secured with: Tape Dental Injury: Teeth and Oropharynx as per pre-operative assessment

## 2022-09-30 NOTE — Progress Notes (Signed)
Per PAT RN documentation Dr. Lovell Sheehan office was notified of positive PCR result on 09/17/22. No new orders were received. Dr. Lovell Sheehan made aware again today and verbal order received for Vancomycin and Clindamycin.

## 2022-09-30 NOTE — H&P (Signed)
Subjective: The patient is a 67 year old white male who fell suffering an L4 fracture.  He healed from this fracture but has complained of persistent back and bilateral buttock and leg pain consistent with neurogenic claudication.  He was worked up with lumbar MRI lumbar x-rays which demonstrated L4-5 spondylolisthesis and spinal stenosis.  I discussed the various treatment options with him.  He has decided proceed with surgery.  Past Medical History:  Diagnosis Date   Cancer [facility])    LPFA skin cancer Dr. Terri Piedra   Diabetes mellitus without complication (HCC)    Hyperlipidemia    Hypertension     Past Surgical History:  Procedure Laterality Date   CIRCUMCISION N/A 03/29/2021   Procedure: DORSAL SLIT CIRCUMCISION ADULT;  Surgeon: Marcine Matar, MD;  Location: [facility];  Service: Urology;  Laterality: N/A;   colonoscopy N/A    NO PAST SURGERIES     SKIN CANCER EXCISION     With Dr Terri Piedra outpatient procedure    No Known Allergies  Social History   Tobacco Use   Smoking status: Never   Smokeless tobacco: Never  Substance Use Topics   Alcohol use: Not Currently    Family History  Problem Relation Age of Onset   Bladder Cancer Father    Prior to Admission medications   Medication Sig Start Date End Date Taking? Authorizing Provider  acetaminophen (TYLENOL) 650 MG CR tablet Take 1,300 mg by mouth every 8 (eight) hours as needed for pain.   Yes [provider]  amLODipine-benazepril (LOTREL) 5-20 MG capsule Take 1 capsule by mouth at bedtime.   Yes [provider]  aspirin 81 MG chewable tablet Chew 81 mg by mouth daily.   Yes [provider]  atorvastatin (LIPITOR) 20 MG tablet Take 20 mg by mouth daily.   Yes [provider]  empagliflozin (JARDIANCE) 25 MG TABS tablet Take 25 mg by mouth daily.   Yes [provider]  gabapentin (NEURONTIN) 300 MG capsule Take 300 mg by mouth 3 (three) times daily. 08/23/22  Yes  [provider]  hydrochlorothiazide (HYDRODIURIL) 25 MG tablet Take 25 mg by mouth daily.   Yes [provider]  Insulin Glargine-Lixisenatide (SOLIQUA) 100-33 UNT-MCG/ML SOPN Inject 30 Units into the skin in the morning and at bedtime.   Yes [provider]  metFORMIN (GLUCOPHAGE-XR) 500 MG 24 hr tablet Take 1,000 mg by mouth 2 (two) times daily with a meal.   Yes [provider]  metoprolol succinate (TOPROL-XL) 50 MG 24 hr tablet Take 50 mg by mouth at bedtime. Take with or immediately following a meal.   Yes [provider]  Multiple Vitamins-Minerals (CENTRUM SILVER ULTRA MENS PO) Take 1 tablet by mouth daily with breakfast.   Yes [provider]  pioglitazone (ACTOS) 30 MG tablet Take 30 mg by mouth at bedtime.   Yes [provider]  Testosterone 30 MG/ACT SOLN Apply 2 Pump topically daily. 07/25/22  Yes [provider]  cetirizine (ZYRTEC) 10 MG tablet Take 10 mg by mouth daily.    [provider]  oxyCODONE (OXY IR/ROXICODONE) 5 MG immediate release tablet Take 1 tablet (5 mg total) by mouth every 4 (four) hours as needed for moderate pain or breakthrough pain. Patient not taking: Reported on 09/10/2022 05/22/22   [place], Eric J, DO     Review of Systems  Positive ROS: As above  All other systems have been reviewed and were otherwise negative with the exception of  those mentioned in the HPI and as above.  Objective: Vital signs in last 24 hours: Temp:  [98.5 F (36.9 C)] 98.5 F (36.9 C) (07/08 1047) Pulse Rate:  [79] 79 (07/08 1047) Resp:  [18] 18 (07/08 1047) BP: (180)/(92) 180/92 (07/08 1047) SpO2:  [95 %] 95 % (07/08 1047) Weight:  [295 kg] 114 kg (07/08 1047) Estimated body mass index is 36.06 kg/m as calculated from the following:   Height as of this encounter: 5\' 10"  (1.778 m).   Weight as of this encounter: 114 kg.   General Appearance: Alert Head: Normocephalic, without obvious  abnormality, atraumatic Eyes: PERRL, conjunctiva/corneas clear, EOM's intact,    Ears: Normal  Throat: Normal  Neck: Supple, Back: unremarkable Lungs: Clear to auscultation bilaterally, respirations unlabored Heart: Regular rate and rhythm, no murmur, rub or gallop Abdomen: Soft, non-tender Extremities: Extremities normal, atraumatic, no cyanosis or edema Skin: unremarkable  NEUROLOGIC:   Mental status: alert and oriented,Motor Exam - grossly normal Sensory Exam - grossly normal Reflexes:  Coordination - grossly normal Gait - grossly normal Balance - grossly normal Cranial Nerves: I: smell Not tested  II: visual acuity  OS: Normal  OD: Normal   II: visual fields Full to confrontation  II: pupils Equal, round, reactive to light  III,VII: ptosis None  III,IV,VI: extraocular muscles  Full ROM  V: mastication Normal  V: facial light touch sensation  Normal  V,VII: corneal reflex  Present  VII: facial muscle function - upper  Normal  VII: facial muscle function - lower Normal  VIII: hearing Not tested  IX: soft palate elevation  Normal  IX,X: gag reflex Present  XI: trapezius strength  5/5  XI: sternocleidomastoid strength 5/5  XI: neck flexion strength  5/5  XII: tongue strength  Normal    Data Review Lab Results  Component Value Date   WBC 6.6 09/17/2022   HGB 14.8 09/17/2022   HCT 45.8 09/17/2022   MCV 95.0 09/17/2022   PLT 234 09/17/2022   Lab Results  Component Value Date   NA 135 09/17/2022   K 3.7 09/17/2022   CL 102 09/17/2022   CO2 24 09/17/2022   BUN 13 09/17/2022   CREATININE 0.88 09/17/2022   GLUCOSE 159 (H) 09/17/2022   No results found for: "INR", "PROTIME"  Assessment/Plan: Lumbar spondylolisthesis, lumbar spinal stenosis, lumbar facet arthropathy, lumbar radiculopathy, neurogenic claudication, lumbago: I have discussed the situation with the patient.  I have reviewed his imaging studies with him and pointed out the abnormalities.  We have  discussed the various treatment options including surgery.  I have described the surgical treatment option of of an L4-5 decompression, instrumentation and fusion.  I have shown him surgical models.  I have given him a surgical pamphlet.  We have discussed the risk, benefits, alternatives, expected postoperative course, and likelihood of achieving our goals with surgery.  I have answered all the patient's questions.  He has decided proceed with surgery.   [name] 09/30/2022 11:15 AM

## 2022-09-30 NOTE — Op Note (Signed)
Subjective: The patient is somnolent but easily arousable.  He is in no apparent distress.  Objective: Vital signs in last 24 hours: Temp:  [98.1 F (36.7 C)-98.5 F (36.9 C)] 98.1 F (36.7 C) (07/08 1615) Pulse Rate:  [67-79] 73 (07/08 1700) Resp:  [17-18] 18 (07/08 1700) BP: (133-180)/(81-92) 136/89 (07/08 1700) SpO2:  [90 %-96 %] 90 % (07/08 1700) Weight:  [161 kg] 114 kg (07/08 1047) Estimated body mass index is 36.06 kg/m as calculated from the following:   Height as of this encounter: 5\' 10"  (1.778 m).   Weight as of this encounter: 114 kg.   Intake/Output from previous day: No intake/output data recorded. Intake/Output this shift: Total I/O In: 1300 [I.V.:1000; IV Piggyback:300] Out: 400 [Urine:200; Blood:200]  Physical exam the patient is somnolent but arousable.  His bilateral gastrocnemius and dorsiflexors strength is grossly normal.  Lab Results: No results for input(s): "WBC", "HGB", "HCT", "PLT" in the last 72 hours. BMET No results for input(s): "NA", "K", "CL", "CO2", "GLUCOSE", "BUN", "CREATININE", "CALCIUM" in the last 72 hours.  Studies/Results: DG Lumbar Spine 2-3 Views  Result Date: 09/30/2022 CLINICAL DATA:  Elective surgery. EXAM: LUMBAR SPINE - 2-3 VIEW COMPARISON:  Preoperative imaging. FINDINGS: Four fluoroscopic spot views of the lumbar spine obtained in the operating room. Pedicle screws at L4 and L5 with interbody spacer in place. Fluoroscopy time 14.1 seconds. Dose 12.844 mGy. IMPRESSION: Intraoperative fluoroscopy during lumbar fusion. Electronically Signed   By: Narda Rutherford M.D.   On: 09/30/2022 16:01   DG Lumbar Spine 1 View  Result Date: 09/30/2022 CLINICAL DATA:  Elective surgery. EXAM: LUMBAR SPINE - 1 VIEW COMPARISON:  08/23/2022 FINDINGS: Portable cross-table lateral view of the lumbar spine obtained in the operating room. Surgical instrument localizes posteriorly overlying the spinous processes at the L5 level. IMPRESSION: Surgical  instrument localizes posteriorly overlying the spinous processes at the L5 level. Electronically Signed   By: Narda Rutherford M.D.   On: 09/30/2022 15:55   DG C-Arm 1-60 Min-No Report  Result Date: 09/30/2022 Fluoroscopy was utilized by the requesting physician.  No radiographic interpretation.    Assessment/Plan: The patient is doing well.  I spoke with the son.  LOS: 0 days     Dwayne Nelson 09/30/2022, 5:07 PM

## 2022-09-30 NOTE — Op Note (Addendum)
Brief history: The patient is a 67 year old white male who fell suffering an L4 compression fracture and had a chronic L4-5 spondylolisthesis.  He has spinal stenosis.  His compression fracture healed but he had persistent neurogenic claudication.  I discussed the various treatment options with him.  He has decided proceed with surgery.  Preoperative diagnosis: L4-5 spondylolisthesis, send arthropathy, L3-4 and L4-5 spinal stenosis compressing the bilateral L3, L4 and L5 nerve roots; lumbago; lumbar radiculopathy; neurogenic claudication; L4 compression fracture  Postoperative diagnosis: The same  Procedure: Bilateral L3-4 and L4-5 laminotomy/foraminotomies/medial facetectomy to decompress the bilateral L3, L4 and L5 nerve roots(the work required to do this was in addition to the work required to do the posterior lumbar interbody fusion because of the patient's spinal stenosis, facet arthropathy. Etc. requiring a wide decompression of the nerve roots.);  L4-5 transforaminal lumbar interbody fusion with local morselized autograft bone and globus DBM; insertion of interbody prosthesis at L4-5 (globus peek expandable interbody prosthesis); posterior nonsegmental instrumentation from L4 to L5 with globus titanium pedicle screws and rods; posterior lateral arthrodesis at L4-5 with local morselized autograft bone and globus DBM.  Surgeon: Dr. Delma Officer  Asst.: Hildred Priest, NP  Anesthesia: Gen. endotracheal  Estimated blood loss: 250 cc  Drains: None  Complications: None  Description of procedure: The patient was brought to the operating room by the anesthesia team. General endotracheal anesthesia was induced. The patient was turned to the prone position on the Wilson frame. The patient's lumbosacral region was then prepared with Betadine scrub and Betadine solution. Sterile drapes were applied.  I then injected the area to be incised with Marcaine with epinephrine solution. I then used the  scalpel to make a linear midline incision over the L3-4 and L4-5 interspace. I then used electrocautery to perform a bilateral subperiosteal dissection exposing the spinous process and lamina of L3-4 and L4-5. We then obtained intraoperative radiograph to confirm our location. We then inserted the Verstrac retractor to provide exposure.  I began the decompression by using the high speed drill to perform laminotomies at L4-5 bilaterally and at L3-4 on the right. We then used the Kerrison punches to widen the laminotomy and removed the ligamentum flavum at L4-5 bilaterally and L3-4 on the right.  I drilled across the midline at L3-4 and performed a left L4-5 laminotomy and resection of the ligamentum flavum decompressing the left L 3 and L4 nerve roots.  We used the Kerrison punches to remove the medial facets at L4-5 bilaterally, we removed the right L4-5 facet. We performed wide foraminotomies about the bilateral 3, L4 and L5 nerve roots completing the decompression.  We now turned our attention to the posterior lumbar interbody fusion. I used a scalpel to incise the intervertebral disc at L4-5 bilaterally. I then performed a partial intervertebral discectomy at L4-5 bilaterally using the pituitary forceps. We prepared the vertebral endplates at L4-5 bilaterally for the fusion by removing the soft tissues with the curettes. We then used the trial spacers to pick the appropriate sized interbody prosthesis. We prefilled his prosthesis with a combination of local morselized autograft bone that we obtained during the decompression as well as Zimmer DBM. We inserted the prefilled prosthesis into the interspace at L4-5 from the right, we then turned and expanded the prosthesis. There was a good snug fit of the prosthesis in the interspace. We then filled and the remainder of the intervertebral disc space with local morselized autograft bone and Zimmer DBM. This completed the posterior lumbar  interbody arthrodesis.   During the decompression and insertion of the prosthesis the assistant protected the thecal sac and nerve roots with the D'Errico retractor.  We now turned attention to the instrumentation. Under fluoroscopic guidance we cannulated the bilateral L4 and L5 pedicles with the bone probe. We then removed the bone probe. We then tapped the pedicle with a 6.5 millimeter tap. We then removed the tap. We probed inside the tapped pedicle with a ball probe to rule out cortical breaches. We then inserted a 7.5 x 50 and 55 millimeter pedicle screw into the L4 and L5 pedicles bilaterally under fluoroscopic guidance. We then palpated along the medial aspect of the pedicles to rule out cortical breaches. There were none. The nerve roots were not injured. We then connected the unilateral pedicle screws with a lordotic rod. We compressed the construct and secured the rod in place with the caps. We then tightened the caps appropriately. This completed the instrumentation from L4-5 bilaterally.  We now turned our attention to the posterior lateral arthrodesis at L4-5. We used the high-speed drill to decorticate the remainder of the facets, pars, transverse process at L4-5. We then applied a combination of local morselized autograft bone and globus DBM over these decorticated posterior lateral structures. This completed the posterior lateral arthrodesis.  We then obtained hemostasis using bipolar electrocautery. We irrigated the wound out with Betadine solution. We inspected the thecal sac and nerve roots and noted they were well decompressed. We then removed the retractor.  We injected Exparel . We reapproximated patient's thoracolumbar fascia with interrupted #1 Vicryl suture. We reapproximated patient's subcutaneous tissue with interrupted 2-0 Vicryl suture. The reapproximated patient's skin with Steri-Strips and benzoin. The wound was then coated with bacitracin ointment. A sterile dressing was applied. The drapes were  removed. The patient was subsequently returned to the supine position where they were extubated by the anesthesia team. He was then transported to the post anesthesia care unit in stable condition. All sponge instrument and needle counts were reportedly correct at the end of this case.

## 2022-09-30 NOTE — Progress Notes (Signed)
Orthopedic Tech Progress Note Patient Details:  Dwayne Nelson 18-Jun-1955 [id number]  Ortho Devices Type of Ortho Device: Lumbar corsett Ortho Device/Splint Location: BACK Ortho Device/Splint Interventions: Ordered   Post Interventions Patient Tolerated: Well Instructions Provided: Care of device  Donald Pore 09/30/2022, 5:10 PM

## 2022-09-30 NOTE — Transfer of Care (Signed)
Immediate Anesthesia Transfer of Care Note  Patient: Dwayne Nelson  Procedure(s) Performed: POSTERIOR LUMBAR INTERBODY FUSION, INTERBODY PROSTHESIS,POSTERIOR INSTRUMENTATION LUMBAR FOUR-FIVE (Back)  Patient Location: PACU  Anesthesia Type:General  Level of Consciousness: awake and alert   Airway & Oxygen Therapy: Patient Spontanous Breathing and Patient connected to face mask oxygen  Post-op Assessment: Report given to RN and Post -op Vital signs reviewed and stable  Post vital signs: Reviewed and stable  Last Vitals:  Vitals Value Taken Time  BP 134/86 09/30/22 1615  Temp 98   Pulse 74 09/30/22 1616  Resp 21 09/30/22 1616  SpO2 90 % 09/30/22 1616  Vitals shown include unvalidated device data.  Last Pain:  Vitals:   09/30/22 1109  PainSc: 3       Patients Stated Pain Goal: 0 (09/30/22 1109)  Complications: No notable events documented.

## 2022-09-30 NOTE — Anesthesia Postprocedure Evaluation (Signed)
Anesthesia Post Note  Patient: Dwayne Nelson  Procedure(s) Performed: POSTERIOR LUMBAR INTERBODY FUSION, INTERBODY PROSTHESIS,POSTERIOR INSTRUMENTATION LUMBAR FOUR-FIVE (Back)     Patient location during evaluation: PACU Anesthesia Type: General Level of consciousness: awake and alert Pain management: pain level controlled Vital Signs Assessment: post-procedure vital signs reviewed and stable Respiratory status: spontaneous breathing, nonlabored ventilation, respiratory function stable and patient connected to nasal cannula oxygen Cardiovascular status: blood pressure returned to baseline and stable Postop Assessment: no apparent nausea or vomiting Anesthetic complications: no  No notable events documented.  Last Vitals:  Vitals:   09/30/22 1700 09/30/22 1736  BP: 136/89 (!) 145/87  Pulse: 73 71  Resp: 18 18  Temp: 36.6 C (!) 36.4 C  SpO2: 90% 97%    Last Pain:  Vitals:   09/30/22 1736  TempSrc: Oral  PainSc:                  Torben Soloway,W. EDMOND

## 2022-09-30 NOTE — Anesthesia Preprocedure Evaluation (Addendum)
Anesthesia Evaluation  Patient identified by MRN, date of birth, ID band Patient awake    Reviewed: Allergy & Precautions, NPO status , Patient's Chart, lab work & pertinent test results  History of Anesthesia Complications Negative for: history of anesthetic complications  Airway Mallampati: II  TM Distance: >3 FB Neck ROM: Full    Dental  (+) Dental Advisory Given   Pulmonary neg pulmonary ROS   Pulmonary exam normal breath sounds clear to auscultation       Cardiovascular hypertension (amlodipine-benazepril, HCTZ, metoprolol), Pt. on medications and Pt. on home beta blockers (-) angina (-) Past MI, (-) Cardiac Stents and (-) CABG + dysrhythmias (RBBB)  Rhythm:Regular Rate:Normal  HLD   Neuro/Psych negative neurological ROS     GI/Hepatic negative GI ROS, Neg liver ROS,,,  Endo/Other  diabetes (Hgb A1c 7.5), Poorly Controlled, Type 2, Oral Hypoglycemic Agents, Insulin Dependent    Renal/GU negative Renal ROS     Musculoskeletal   Abdominal  (+) + obese  Peds  Hematology negative hematology ROS (+)   Anesthesia Other Findings   Reproductive/Obstetrics                             Anesthesia Physical Anesthesia Plan  ASA: 3  Anesthesia Plan: General   Post-op Pain Management: Tylenol PO (pre-op)*, Dilaudid IV and Ketamine IV*   Induction: Intravenous  PONV Risk Score and Plan: 2 and Ondansetron, Dexamethasone and Treatment may vary due to age or medical condition  Airway Management Planned: Oral ETT  Additional Equipment:   Intra-op Plan:   Post-operative Plan: Extubation in OR  Informed Consent: I have reviewed the patients History and Physical, chart, labs and discussed the procedure including the risks, benefits and alternatives for the proposed anesthesia with the patient or authorized representative who has indicated his/her understanding and acceptance.     Dental  advisory given  Plan Discussed with: CRNA and Anesthesiologist  Anesthesia Plan Comments: (Risks of general anesthesia discussed including, but not limited to, sore throat, hoarse voice, chipped/damaged teeth, injury to vocal cords, nausea and vomiting, allergic reactions, lung infection, heart attack, stroke, and death. All questions answered. )        Anesthesia Quick Evaluation

## 2022-10-01 DIAGNOSIS — T8484XA Pain due to internal orthopedic prosthetic devices, implants and grafts, initial encounter: Secondary | ICD-10-CM | POA: Diagnosis not present

## 2022-10-01 DIAGNOSIS — I10 Essential (primary) hypertension: Secondary | ICD-10-CM | POA: Diagnosis not present

## 2022-10-01 DIAGNOSIS — M48061 Spinal stenosis, lumbar region without neurogenic claudication: Secondary | ICD-10-CM | POA: Diagnosis not present

## 2022-10-01 DIAGNOSIS — T48205A Adverse effect of unspecified drugs acting on muscles, initial encounter: Secondary | ICD-10-CM | POA: Diagnosis not present

## 2022-10-01 DIAGNOSIS — G8918 Other acute postprocedural pain: Secondary | ICD-10-CM | POA: Diagnosis not present

## 2022-10-01 DIAGNOSIS — M5136 Other intervertebral disc degeneration, lumbar region: Secondary | ICD-10-CM | POA: Diagnosis not present

## 2022-10-01 DIAGNOSIS — M545 Low back pain, unspecified: Secondary | ICD-10-CM | POA: Diagnosis not present

## 2022-10-01 DIAGNOSIS — R443 Hallucinations, unspecified: Secondary | ICD-10-CM | POA: Diagnosis not present

## 2022-10-01 DIAGNOSIS — E119 Type 2 diabetes mellitus without complications: Secondary | ICD-10-CM | POA: Diagnosis not present

## 2022-10-01 DIAGNOSIS — R609 Edema, unspecified: Secondary | ICD-10-CM | POA: Diagnosis not present

## 2022-10-01 LAB — GLUCOSE, CAPILLARY: Glucose-Capillary: 163 mg/dL — ABNORMAL HIGH (ref 70–99)

## 2022-10-01 MED ORDER — OXYCODONE-ACETAMINOPHEN 5-325 MG PO TABS: 1.0000 | ORAL_TABLET | ORAL | 0 refills | Status: DC | PRN

## 2022-10-01 MED ORDER — DOCUSATE SODIUM 100 MG PO CAPS: 100.0000 mg | ORAL_CAPSULE | Freq: Two times a day (BID) | ORAL | 0 refills | Status: AC

## 2022-10-01 MED ORDER — CYCLOBENZAPRINE HCL 5 MG PO TABS: 5.0000 mg | ORAL_TABLET | Freq: Three times a day (TID) | ORAL | 1 refills | Status: DC | PRN

## 2022-10-01 MED FILL — Thrombin For Soln 5000 Unit: CUTANEOUS | Qty: 5000 | Status: AC

## 2022-10-01 NOTE — Plan of Care (Signed)
Pt given D/C instructions with verbal understanding. Rx's were sent to the pharmacy by MD. Pt's incision is clean and dry with no sign of infection. Pt's IV was removed prior to D/C. Pt D/C'd home via wheelchair per MD order. Pt is stable @ D/C and has no other needs at this time. Tishana Clinkenbeard, RN  

## 2022-10-01 NOTE — TOC Transition Note (Signed)
Transition of Care [facility]) - CM/SW Discharge Note   Patient Details  Name: Dwayne Nelson MRN: [id number] Date of Birth: 09-14-1955  Transition of [facility]) CM/SW Contact:  Kermit Balo, RN Phone Number: 10/01/2022, 11:07 AM   Clinical Narrative:    Pt is discharging home with home health services through [facility]. He will be at sons home: [address] in [place] [address] Information on his AVS. Any needed DME will be obtained by bedside RN. Pt has transportation home.   Final next level of care: Home w Home Health Services Barriers to Discharge: No Barriers Identified   Patient Goals and CMS Choice CMS Medicare.gov Compare Post Acute Care list provided to:: Patient Choice offered to / list presented to : Patient  Discharge Placement                         Discharge Plan and Services Additional resources added to the After Visit Summary for                            [facility] Arranged: PT [facility] Agency: [facility] Date [facility] Agency Contacted: 10/01/22   Representative spoke with at [facility] Agency: Haywood Lasso  Social Determinants of Health (SDOH) Interventions SDOH Screenings   Food Insecurity: No Food Insecurity (05/18/2022)  Housing: Low Risk  (05/18/2022)  Transportation Needs: No Transportation Needs (05/18/2022)  Utilities: Not At Risk (05/18/2022)  Depression (PHQ2-9): Low Risk  (01/22/2022)  Tobacco Use: Low Risk  (09/30/2022)     Readmission Risk Interventions     No data to display

## 2022-10-01 NOTE — Evaluation (Signed)
Physical Therapy Evaluation Patient Details Name: Dwayne Nelson MRN: [id number] DOB: [birth year] Today's Date: 10/01/2022  History of Present Illness  67 y.o. male s/p Bilateral L3-4 and L4-5 laminotomy/foraminotomies/medial facetectomy (09/30/2022). PMH significant for: CA, DM, HLD, and HTN.   Clinical Impression  Prior to admission, patient endorsed independence with functional mobility and ADLs; intermittent use of RW or walking stick due to pain in lower back. He is supported by his son whom will be available following discharge. In today's session patient performed bed mobility and transfers with physical assist by PT; able to verbalize and maintain spinal precautions. Gait training distance limited due to pain in bilateral calves following cue for step through pattern; educated patient on stretching technique and ankle pumps in order to reduce stiffness. End of session included review of spinal precautions, exercise progression and car transfers during discharge. Based on today's performance, patient would benefit from continued skilled physical therapy in order to facilitate improvements with gait, balance and LE strength.         Assistance Recommended at Discharge Intermittent Supervision/Assistance  If plan is discharge home, recommend the following:  Can travel by private vehicle  A little help with walking and/or transfers;A little help with bathing/dressing/bathroom;Assist for transportation;Help with stairs or ramp for entrance        Equipment Recommendations None recommended by PT  Recommendations for Other Services       Functional Status Assessment Patient has had a recent decline in their functional status and demonstrates the ability to make significant improvements in function in a reasonable and predictable amount of time.     Precautions / Restrictions Precautions Precautions: Fall;Back Precaution Booklet Issued: Yes (comment) Precaution Comments: Educated and  reviewed Spinal precautions throughout session. Required Braces or Orthoses: Spinal Brace Spinal Brace: Lumbar corset;Applied in sitting position Restrictions Weight Bearing Restrictions: No      Mobility  Bed Mobility Overal bed mobility: Needs Assistance Bed Mobility: Sidelying to Sit, Sit to Sidelying, Rolling Rolling: Supervision Sidelying to sit: Supervision     Sit to sidelying: Supervision General bed mobility comments: Provided close supervision throughout bed mobility. Pt received in Sidelying; able to transition LE to EOB and sit upright without PT assist. Pt able to maintain spinal precautions and demonstrate sidelying to supine without PT assist.    Transfers Overall transfer level: Needs assistance Equipment used: Rolling walker (2 wheels) Transfers: Sit to/from Stand Sit to Stand: Supervision           General transfer comment: Close supervision provided with sit to stand; patient utilized RW in order for power up. Able to stand and remain upright without LOB or physical assistance from PT.    Ambulation/Gait Ambulation/Gait assistance: Min guard Gait Distance (Feet): 300 Feet Assistive device: Rolling walker (2 wheels) Gait Pattern/deviations: Step-to pattern, Decreased step length - right, Decreased step length - left, Decreased stride length, Decreased dorsiflexion - right, Decreased dorsiflexion - left, Trunk flexed Gait velocity: decreased Gait velocity interpretation: <1.31 ft/sec, indicative of household ambulator   General Gait Details: Presented with decreased velocity and step-to pattern; able to improve to step through but pt reported pain in bilateral calves. Throughout ambulation presented with bilateral external rotation, decreased dorsiflexion and step length.  Stairs Stairs: Yes Stairs assistance: Min guard Stair Management: One rail Right, Forwards, Sideways Number of Stairs: 2 General stair comments: one instance of instability when  reaching for right rail with LUE. Needed min guard for safety and assist. Patient able to navigate steps with  sideways technique on right rale.  Wheelchair Mobility     Tilt Bed    Modified Rankin (Stroke Patients Only)       Balance Overall balance assessment: Needs assistance Sitting-balance support: No upper extremity supported, Feet supported Sitting balance-Leahy Scale: Good Sitting balance - Comments: Able to sit EOB and donn spinal brace without UE support.   Standing balance support: Bilateral upper extremity supported, During functional activity, Reliant on assistive device for balance Standing balance-Leahy Scale: Poor Standing balance comment: Reliant on RW support in static stance and functional mobility.                             Pertinent Vitals/Pain Pain Assessment Pain Assessment: 0-10 Pain Score: 6  Pain Location: Incision Site, Bilateral Calves Pain Descriptors / Indicators: Operative site guarding, Discomfort Pain Intervention(s): Limited activity within patient's tolerance, Monitored during session    Home Living Family/patient expects to be discharged to:: Private residence Living Arrangements: Children Available Help at Discharge: Family;Available PRN/intermittently Type of Home: House Home Access: Stairs to enter Entrance Stairs-Rails: Right Entrance Stairs-Number of Steps: 2   Home Layout: One level Home Equipment: None Additional Comments: Following discharge, will live at [facility] house for a week.    Prior Function Prior Level of Function : Independent/Modified Independent;Driving             Mobility Comments: Independent with intermittent use of walking stick or RW due to pain. ADLs Comments: independent     Hand Dominance   Dominant Hand: Right    Extremity/Trunk Assessment   Upper Extremity Assessment Upper Extremity Assessment: Overall WFL for tasks assessed    Lower Extremity Assessment Lower Extremity  Assessment: Generalized weakness;RLE deficits/detail RLE Deficits / Details: RLE presents with muscular atrophy at calves.       Communication   Communication: No difficulties  Cognition Arousal/Alertness: Awake/alert Behavior During Therapy: WFL for tasks assessed/performed Overall Cognitive Status: Within Functional Limits for tasks assessed                                 General Comments: A&Ox4; Pleasant throughout entire session.        General Comments General comments (skin integrity, edema, etc.): Atrophy noted in right calves.    Exercises     Assessment/Plan    PT Assessment Patient needs continued PT services  PT Problem List Decreased strength;Decreased range of motion;Decreased activity tolerance;Decreased balance;Decreased mobility;Decreased coordination;Decreased knowledge of use of DME;Pain       PT Treatment Interventions DME instruction;Gait training;Stair training;Functional mobility training;Therapeutic activities;Therapeutic exercise    PT Goals (Current goals can be found in the Care Plan section)  Acute Rehab PT Goals Patient Stated Goal: Patient would like to return home PT Goal Formulation: With patient Time For Goal Achievement: 10/08/22 Potential to Achieve Goals: Good    Frequency Min 1X/week     Co-evaluation               AM-PAC PT "6 Clicks" Mobility  Outcome Measure Help needed turning from your back to your side while in a flat bed without using bedrails?: None Help needed moving from lying on your back to sitting on the side of a flat bed without using bedrails?: None Help needed moving to and from a bed to a chair (including a wheelchair)?: None Help needed standing up from a chair using your arms (  e.g., wheelchair or bedside chair)?: A Little Help needed to walk in hospital room?: A Little Help needed climbing 3-5 steps with a railing? : A Little 6 Click Score: 21    End of Session Equipment Utilized During  Treatment: Gait belt;Back brace Activity Tolerance: Patient tolerated treatment well Patient left: in bed;with call bell/phone within reach Nurse Communication: Mobility status PT Visit Diagnosis: Muscle weakness (generalized) (M62.81);History of falling (Z91.81);Difficulty in walking, not elsewhere classified (R26.2)    Time: 9604-5409 PT Time Calculation (min) (ACUTE ONLY): 28 min   Charges:   PT Evaluation $PT Eval Low Complexity: 1 Low PT Treatments $Gait Training: 8-22 mins PT General Charges $$ ACUTE PT VISIT: 1 Visit         Christene Lye, SPT Acute Rehabilitation Services [phone] Secure chat preferred  a  Cristal Deer Kristilyn Coltrane 10/01/2022, 11:01 AM

## 2022-10-01 NOTE — Discharge Summary (Signed)
Physician Discharge Summary     Providing Compassionate, Quality Care - Together   Patient ID: Dwayne Nelson MRN: [id number] DOB/AGE: 69-May-1957 89 y.o.  Admit date: 09/30/2022 Discharge date: 10/01/2022  Admission Diagnoses: Spondylolisthesis of lumbar region  Discharge Diagnoses:  Principal Problem:   Spondylolisthesis of lumbar region   Discharged Condition: good  Hospital Course: Patient underwent a lumbar decompression at L3, L4, and L5, with posterior fusion at L4-5 by Dr. Lovell Sheehan on 09/30/2022. He was admitted to 3C10  following recovery from anesthesia in the PACU. His postoperative course has been uncomplicated. He has worked with both physical and occupational therapies who feel the patient is ready for discharge home with home health therapies. He is ambulating with assistance. He is tolerating a normal diet. He is not having any bowel or bladder dysfunction. His pain is well-controlled with oral pain medication. He is ready for discharge home.   Consults: PT/OT/TOC  Significant Diagnostic Studies: radiology: DG Lumbar Spine 2-3 Views  Result Date: 09/30/2022 CLINICAL DATA:  Elective surgery. EXAM: LUMBAR SPINE - 2-3 VIEW COMPARISON:  Preoperative imaging. FINDINGS: Four fluoroscopic spot views of the lumbar spine obtained in the operating room. Pedicle screws at L4 and L5 with interbody spacer in place. Fluoroscopy time 14.1 seconds. Dose 12.844 mGy. IMPRESSION: Intraoperative fluoroscopy during lumbar fusion. Electronically Signed   By: Narda Rutherford M.D.   On: 09/30/2022 16:01   DG Lumbar Spine 1 View  Result Date: 09/30/2022 CLINICAL DATA:  Elective surgery. EXAM: LUMBAR SPINE - 1 VIEW COMPARISON:  08/23/2022 FINDINGS: Portable cross-table lateral view of the lumbar spine obtained in the operating room. Surgical instrument localizes posteriorly overlying the spinous processes at the L5 level. IMPRESSION: Surgical instrument localizes posteriorly overlying the spinous  processes at the L5 level. Electronically Signed   By: Narda Rutherford M.D.   On: 09/30/2022 15:55   DG C-Arm 1-60 Min-No Report  Result Date: 09/30/2022 Fluoroscopy was utilized by the requesting physician.  No radiographic interpretation.     Treatments: surgery: Bilateral L3-4 and L4-5 laminotomy/foraminotomies/medial facetectomy to decompress the bilateral L3, L4 and L5 nerve roots(the work required to do this was in addition to the work required to do the posterior lumbar interbody fusion because of the patient's spinal stenosis, facet arthropathy. Etc. requiring a wide decompression of the nerve roots.);  L4-5 transforaminal lumbar interbody fusion with local morselized autograft bone and globus DBM; insertion of interbody prosthesis at L4-5 (globus peek expandable interbody prosthesis); posterior nonsegmental instrumentation from L4 to L5 with globus titanium pedicle screws and rods; posterior lateral arthrodesis at L4-5 with local morselized autograft bone and globus DBM.   Discharge Exam: Blood pressure (!) 111/55, pulse 73, temperature 98.7 F (37.1 C), temperature source Oral, resp. rate 20, height 5\' 10"  (1.778 m), weight 114 kg, SpO2 97 %.  Per report: Alert and oriented x 4 PERRLA CN II-XII grossly intact MAE, Strength and sensation intact Incision is covered with Honeycomb dressing and Steri Strips; Dressing is clean, dry, and intact   Disposition: Discharge disposition: 01-Home or Self Care       Discharge Instructions     Call MD for:  difficulty breathing, headache or visual disturbances   Complete by: As directed    Call MD for:  hives   Complete by: As directed    Call MD for:  persistant nausea and vomiting   Complete by: As directed    Call MD for:  redness, tenderness, or signs of infection (pain, swelling, redness,  odor or green/yellow discharge around incision site)   Complete by: As directed    Call MD for:  severe uncontrolled pain   Complete by: As  directed    Call MD for:  temperature >100.4   Complete by: As directed    Diet - low sodium heart healthy   Complete by: As directed    Increase activity slowly   Complete by: As directed       Allergies as of 10/01/2022   No Known Allergies      Medication List     STOP taking these medications    oxyCODONE 5 MG immediate release tablet Commonly known as: Oxy IR/ROXICODONE       TAKE these medications    acetaminophen 650 MG CR tablet Commonly known as: TYLENOL Take 1,300 mg by mouth every 8 (eight) hours as needed for pain.   amLODipine-benazepril 5-20 MG capsule Commonly known as: LOTREL Take 1 capsule by mouth at bedtime.   aspirin 81 MG chewable tablet Chew 81 mg by mouth daily.   atorvastatin 20 MG tablet Commonly known as: LIPITOR Take 20 mg by mouth daily.   CENTRUM SILVER ULTRA MENS PO Take 1 tablet by mouth daily with breakfast.   cetirizine 10 MG tablet Commonly known as: ZYRTEC Take 10 mg by mouth daily.   cyclobenzaprine 5 MG tablet Commonly known as: FLEXERIL Take 1 tablet (5 mg total) by mouth 3 (three) times daily as needed for muscle spasms.   docusate sodium 100 MG capsule Commonly known as: COLACE Take 1 capsule (100 mg total) by mouth 2 (two) times daily.   empagliflozin 25 MG Tabs tablet Commonly known as: JARDIANCE Take 25 mg by mouth daily.   gabapentin 300 MG capsule Commonly known as: NEURONTIN Take 300 mg by mouth 3 (three) times daily.   hydrochlorothiazide 25 MG tablet Commonly known as: HYDRODIURIL Take 25 mg by mouth daily.   metFORMIN 500 MG 24 hr tablet Commonly known as: GLUCOPHAGE-XR Take 1,000 mg by mouth 2 (two) times daily with a meal.   metoprolol succinate 50 MG 24 hr tablet Commonly known as: TOPROL-XL Take 50 mg by mouth at bedtime. Take with or immediately following a meal.   oxyCODONE-acetaminophen 5-325 MG tablet Commonly known as: Percocet Take 1 tablet by mouth every 4 (four) hours as needed  for severe pain.   pioglitazone 30 MG tablet Commonly known as: ACTOS Take 30 mg by mouth at bedtime.   Soliqua 100-33 UNT-MCG/ML Sopn Generic drug: Insulin Glargine-Lixisenatide Inject 30 Units into the skin in the morning and at bedtime.   Testosterone 30 MG/ACT Soln Apply 2 Pump topically daily.        Follow-up Information     [facility] Follow up.   Why: [phone] The home health agency will contact you for the first home visit        Tressie Stalker, MD. Go on 10/22/2022.   Specialty: Neurosurgery Why: First post op appointment with x-rays is at 8:30 AM on 10/22/2022. Contact information: [address] [place] [address] [phone]                 Signed: Val Eagle, DNP, AGNP-C Nurse Practitioner  [facility] [address], [place], [place] [address] P: [phone]    F: [phone]  10/01/2022, 11:12 AM

## 2022-10-01 NOTE — Discharge Instructions (Signed)
Wound Care Keep incision covered and dry until post op day 3. You may remove the Honeycomb dressing on post op day 3. Leave steri-strips on back.  They will fall off by themselves. Do not put any creams, lotions, or ointments on incision. You are fine to shower. Let water run over incision and pat dry.  Activity Walk each and every day, increasing distance each day. No lifting greater than 5 lbs.  Avoid excessive back motion. No driving for 2 weeks; may ride as a passenger locally.  Diet Resume your normal diet.   Return to Work Will be discussed at your follow up appointment.  Call Your Doctor If Any of These Occur Redness, drainage, or swelling at the wound.  Temperature greater than 101 degrees. Severe pain not relieved by pain medication. Incision starts to come apart.  Follow Up Appt Call [phone] today for appointment in 3 weeks if you don't already have one or for any problems.  If you have any hardware placed in your spine, you will need an x-ray before your appointment.  

## 2022-10-01 NOTE — Progress Notes (Signed)
Subjective: The patient is alert and pleasant.  He looks well.    Objective: Vital signs in last 24 hours: Temp:  [97.5 F (36.4 C)-98.9 F (37.2 C)] 98.7 F (37.1 C) (07/09 0352) Pulse Rate:  [62-79] 62 (07/09 0352) Resp:  [17-18] 18 (07/09 0352) BP: (117-180)/(74-92) 117/74 (07/09 0352) SpO2:  [90 %-97 %] 96 % (07/09 0352) Weight:  [161 kg] 114 kg (07/08 1047) Estimated body mass index is 36.06 kg/m as calculated from the following:   Height as of this encounter: 5\' 10"  (1.778 m).   Weight as of this encounter: 114 kg.   Intake/Output from previous day: 07/08 0701 - 07/09 0700 In: 1300 [I.V.:1000; IV Piggyback:300] Out: 800 [Urine:300; Stool:300; Blood:200] Intake/Output this shift: No intake/output data recorded.  Physical exam the patient is alert and oriented.  His strength is normal.  His dressing has a small blood stain.  Lab Results: No results for input(s): "WBC", "HGB", "HCT", "PLT" in the last 72 hours. BMET No results for input(s): "NA", "K", "CL", "CO2", "GLUCOSE", "BUN", "CREATININE", "CALCIUM" in the last 72 hours.  Studies/Results: DG Lumbar Spine 2-3 Views  Result Date: 09/30/2022 CLINICAL DATA:  Elective surgery. EXAM: LUMBAR SPINE - 2-3 VIEW COMPARISON:  Preoperative imaging. FINDINGS: Four fluoroscopic spot views of the lumbar spine obtained in the operating room. Pedicle screws at L4 and L5 with interbody spacer in place. Fluoroscopy time 14.1 seconds. Dose 12.844 mGy. IMPRESSION: Intraoperative fluoroscopy during lumbar fusion. Electronically Signed   By: Narda Rutherford M.D.   On: 09/30/2022 16:01   DG Lumbar Spine 1 View  Result Date: 09/30/2022 CLINICAL DATA:  Elective surgery. EXAM: LUMBAR SPINE - 1 VIEW COMPARISON:  08/23/2022 FINDINGS: Portable cross-table lateral view of the lumbar spine obtained in the operating room. Surgical instrument localizes posteriorly overlying the spinous processes at the L5 level. IMPRESSION: Surgical instrument  localizes posteriorly overlying the spinous processes at the L5 level. Electronically Signed   By: Narda Rutherford M.D.   On: 09/30/2022 15:55   DG C-Arm 1-60 Min-No Report  Result Date: 09/30/2022 Fluoroscopy was utilized by the requesting physician.  No radiographic interpretation.    Assessment/Plan: Postop day #1: The patient is doing well.  We will mobilize him with PT.  He will likely go home later on today.  I gave him his discharge instructions and answered all his questions.  LOS: 0 days     [name] 10/01/2022, 7:43 AM     Patient ID: Dwayne Nelson, male   DOB: 09/10/1955, 67 y.o.   MRN: [id number]

## 2022-10-03 DIAGNOSIS — E1165 Type 2 diabetes mellitus with hyperglycemia: Secondary | ICD-10-CM | POA: Diagnosis not present

## 2022-10-03 DIAGNOSIS — I10 Essential (primary) hypertension: Secondary | ICD-10-CM | POA: Diagnosis not present

## 2022-10-03 DIAGNOSIS — E782 Mixed hyperlipidemia: Secondary | ICD-10-CM | POA: Diagnosis not present

## 2022-10-03 MED FILL — Heparin Sodium (Porcine) Inj 1000 Unit/ML: INTRAMUSCULAR | Qty: 30 | Status: AC

## 2022-10-04 ENCOUNTER — Other Ambulatory Visit: Payer: Self-pay

## 2022-10-04 ENCOUNTER — Emergency Department (HOSPITAL_COMMUNITY): Payer: Medicare Other

## 2022-10-04 ENCOUNTER — Encounter (HOSPITAL_COMMUNITY): Payer: Self-pay

## 2022-10-04 ENCOUNTER — Inpatient Hospital Stay (HOSPITAL_COMMUNITY)
Admission: EM | Admit: 2022-10-04 | Discharge: 2022-10-07 | DRG: 560 | Disposition: A | Discharge status: Rehabilitation Facility | Payer: Medicare Other | Attending: Neurosurgery | Admitting: Neurosurgery

## 2022-10-04 DIAGNOSIS — Y838 Other surgical procedures as the cause of abnormal reaction of the patient, or of later complication, without mention of misadventure at the time of the procedure: Secondary | ICD-10-CM | POA: Diagnosis present

## 2022-10-04 DIAGNOSIS — R531 Weakness: Secondary | ICD-10-CM | POA: Diagnosis present

## 2022-10-04 DIAGNOSIS — Z7984 Long term (current) use of oral hypoglycemic drugs: Secondary | ICD-10-CM | POA: Diagnosis not present

## 2022-10-04 DIAGNOSIS — R609 Edema, unspecified: Secondary | ICD-10-CM | POA: Diagnosis not present

## 2022-10-04 DIAGNOSIS — Z79899 Other long term (current) drug therapy: Secondary | ICD-10-CM

## 2022-10-04 DIAGNOSIS — M5136 Other intervertebral disc degeneration, lumbar region: Secondary | ICD-10-CM | POA: Diagnosis not present

## 2022-10-04 DIAGNOSIS — Z91A3 Caregiver's unintentional underdosing of patient's medication regimen: Secondary | ICD-10-CM

## 2022-10-04 DIAGNOSIS — R443 Hallucinations, unspecified: Secondary | ICD-10-CM | POA: Diagnosis not present

## 2022-10-04 DIAGNOSIS — T50916A Underdosing of multiple unspecified drugs, medicaments and biological substances, initial encounter: Secondary | ICD-10-CM | POA: Diagnosis present

## 2022-10-04 DIAGNOSIS — M5416 Radiculopathy, lumbar region: Secondary | ICD-10-CM | POA: Diagnosis not present

## 2022-10-04 DIAGNOSIS — Z4789 Encounter for other orthopedic aftercare: Secondary | ICD-10-CM | POA: Diagnosis not present

## 2022-10-04 DIAGNOSIS — T48205A Adverse effect of unspecified drugs acting on muscles, initial encounter: Secondary | ICD-10-CM | POA: Diagnosis not present

## 2022-10-04 DIAGNOSIS — M4316 Spondylolisthesis, lumbar region: Secondary | ICD-10-CM | POA: Diagnosis present

## 2022-10-04 DIAGNOSIS — Z7982 Long term (current) use of aspirin: Secondary | ICD-10-CM

## 2022-10-04 DIAGNOSIS — R159 Full incontinence of feces: Secondary | ICD-10-CM | POA: Diagnosis not present

## 2022-10-04 DIAGNOSIS — I10 Essential (primary) hypertension: Secondary | ICD-10-CM | POA: Diagnosis present

## 2022-10-04 DIAGNOSIS — M545 Low back pain, unspecified: Secondary | ICD-10-CM | POA: Diagnosis not present

## 2022-10-04 DIAGNOSIS — Z85828 Personal history of other malignant neoplasm of skin: Secondary | ICD-10-CM

## 2022-10-04 DIAGNOSIS — G8918 Other acute postprocedural pain: Secondary | ICD-10-CM

## 2022-10-04 DIAGNOSIS — Z8052 Family history of malignant neoplasm of bladder: Secondary | ICD-10-CM | POA: Diagnosis not present

## 2022-10-04 DIAGNOSIS — M62838 Other muscle spasm: Secondary | ICD-10-CM | POA: Diagnosis not present

## 2022-10-04 DIAGNOSIS — E11649 Type 2 diabetes mellitus with hypoglycemia without coma: Secondary | ICD-10-CM | POA: Diagnosis not present

## 2022-10-04 DIAGNOSIS — T8484XA Pain due to internal orthopedic prosthetic devices, implants and grafts, initial encounter: Secondary | ICD-10-CM | POA: Diagnosis present

## 2022-10-04 DIAGNOSIS — E119 Type 2 diabetes mellitus without complications: Secondary | ICD-10-CM | POA: Diagnosis present

## 2022-10-04 DIAGNOSIS — Z981 Arthrodesis status: Secondary | ICD-10-CM | POA: Diagnosis not present

## 2022-10-04 DIAGNOSIS — R7989 Other specified abnormal findings of blood chemistry: Secondary | ICD-10-CM | POA: Diagnosis present

## 2022-10-04 DIAGNOSIS — M7918 Myalgia, other site: Secondary | ICD-10-CM | POA: Diagnosis not present

## 2022-10-04 DIAGNOSIS — E785 Hyperlipidemia, unspecified: Secondary | ICD-10-CM | POA: Diagnosis present

## 2022-10-04 DIAGNOSIS — W19XXXA Unspecified fall, initial encounter: Secondary | ICD-10-CM | POA: Diagnosis not present

## 2022-10-04 DIAGNOSIS — M48061 Spinal stenosis, lumbar region without neurogenic claudication: Secondary | ICD-10-CM | POA: Diagnosis present

## 2022-10-04 DIAGNOSIS — M4326 Fusion of spine, lumbar region: Secondary | ICD-10-CM | POA: Diagnosis not present

## 2022-10-04 DIAGNOSIS — L7632 Postprocedural hematoma of skin and subcutaneous tissue following other procedure: Secondary | ICD-10-CM | POA: Diagnosis not present

## 2022-10-04 LAB — CBC
HCT: 41.1 % (ref 39.0–52.0)
Hemoglobin: 13 g/dL (ref 13.0–17.0)
MCH: 30.8 pg (ref 26.0–34.0)
MCHC: 31.6 g/dL (ref 30.0–36.0)
MCV: 97.4 fL (ref 80.0–100.0)
Platelets: 239 10*3/uL (ref 150–400)
RBC: 4.22 MIL/uL (ref 4.22–5.81)
RDW: 13 % (ref 11.5–15.5)
WBC: 7.3 10*3/uL (ref 4.0–10.5)
nRBC: 0 % (ref 0.0–0.2)

## 2022-10-04 LAB — URINALYSIS, ROUTINE W REFLEX MICROSCOPIC
Bilirubin Urine: NEGATIVE
Glucose, UA: >=500 mg/dL — AB
Hgb urine dipstick: NEGATIVE
Ketones, ur: 80 mg/dL — AB
Nitrite: NEGATIVE
Protein, ur: 100 mg/dL — AB
Specific Gravity, Urine: 1.029 (ref 1.005–1.030)
pH: 5 (ref 5.0–8.0)

## 2022-10-04 LAB — BASIC METABOLIC PANEL
Anion gap: 14 (ref 5–15)
BUN: 15 mg/dL (ref 8–23)
CO2: 18 mmol/L — ABNORMAL LOW (ref 22–32)
Calcium: 9 mg/dL (ref 8.9–10.3)
Chloride: 107 mmol/L (ref 98–111)
Creatinine, Ser: 0.97 mg/dL (ref 0.61–1.24)
GFR, Estimated: >60 mL/min (ref >60)
Glucose, Bld: 143 mg/dL — ABNORMAL HIGH (ref 70–99)
Potassium: 4 mmol/L (ref 3.5–5.1)
Sodium: 139 mmol/L (ref 135–145)

## 2022-10-04 LAB — GLUCOSE, CAPILLARY: Glucose-Capillary: 124 mg/dL — ABNORMAL HIGH (ref 70–99)

## 2022-10-04 MED ORDER — POLYETHYLENE GLYCOL 3350 17 G PO PACK: 17.0000 g | PACK | Freq: Every day | ORAL | Status: DC | PRN

## 2022-10-04 MED ORDER — GABAPENTIN 300 MG PO CAPS
300.0000 mg | ORAL_CAPSULE | ORAL | Status: AC
  Administered 2022-10-04: 300 mg via ORAL
  Filled 2022-10-04: qty 1

## 2022-10-04 MED ORDER — HYDROMORPHONE HCL 1 MG/ML IJ SOLN
0.5000 mg | Freq: Once | INTRAMUSCULAR | Status: AC
  Administered 2022-10-04: 0.5 mg via INTRAVENOUS
  Filled 2022-10-04: qty 1

## 2022-10-04 MED ORDER — INSULIN GLARGINE-LIXISENATIDE 100-33 UNT-MCG/ML ~~LOC~~ SOPN: 30.0000 [IU] | PEN_INJECTOR | Freq: Two times a day (BID) | SUBCUTANEOUS | Status: DC

## 2022-10-04 MED ORDER — TESTOSTERONE 50 MG/5GM (1%) TD GEL
5.0000 g | Freq: Every day | TRANSDERMAL | Status: DC
  Administered 2022-10-05 – 2022-10-06 (×2): 5 g via TRANSDERMAL
  Filled 2022-10-04 (×3): qty 5

## 2022-10-04 MED ORDER — BISACODYL 10 MG RE SUPP: 10.0000 mg | Freq: Every day | RECTAL | Status: DC | PRN

## 2022-10-04 MED ORDER — ONDANSETRON HCL 4 MG PO TABS: 4.0000 mg | ORAL_TABLET | Freq: Four times a day (QID) | ORAL | Status: DC | PRN

## 2022-10-04 MED ORDER — AMLODIPINE BESYLATE 5 MG PO TABS
5.0000 mg | ORAL_TABLET | Freq: Every day | ORAL | Status: DC
  Administered 2022-10-04 – 2022-10-07 (×4): 5 mg via ORAL
  Filled 2022-10-04 (×4): qty 1

## 2022-10-04 MED ORDER — ONDANSETRON HCL 4 MG/2ML IJ SOLN: 4.0000 mg | Freq: Four times a day (QID) | INTRAMUSCULAR | Status: DC | PRN

## 2022-10-04 MED ORDER — LORATADINE 10 MG PO TABS
10.0000 mg | ORAL_TABLET | Freq: Every day | ORAL | Status: DC
  Administered 2022-10-05 – 2022-10-07 (×3): 10 mg via ORAL
  Filled 2022-10-04 (×3): qty 1

## 2022-10-04 MED ORDER — GABAPENTIN 300 MG PO CAPS
300.0000 mg | ORAL_CAPSULE | Freq: Three times a day (TID) | ORAL | Status: DC
  Administered 2022-10-04 – 2022-10-07 (×8): 300 mg via ORAL
  Filled 2022-10-04 (×8): qty 1

## 2022-10-04 MED ORDER — OXYCODONE-ACETAMINOPHEN 5-325 MG PO TABS
1.0000 | ORAL_TABLET | ORAL | Status: DC | PRN
  Administered 2022-10-05 – 2022-10-07 (×3): 1 via ORAL
  Filled 2022-10-04 (×3): qty 1

## 2022-10-04 MED ORDER — ACETAMINOPHEN 325 MG PO TABS
650.0000 mg | ORAL_TABLET | Freq: Four times a day (QID) | ORAL | Status: DC | PRN
  Administered 2022-10-06: 650 mg via ORAL
  Filled 2022-10-04: qty 2

## 2022-10-04 MED ORDER — PIOGLITAZONE HCL 30 MG PO TABS
30.0000 mg | ORAL_TABLET | Freq: Every day | ORAL | Status: DC
  Administered 2022-10-04 – 2022-10-06 (×3): 30 mg via ORAL
  Filled 2022-10-04 (×4): qty 1

## 2022-10-04 MED ORDER — METOPROLOL TARTRATE 5 MG/5ML IV SOLN: 5.0000 mg | Freq: Four times a day (QID) | INTRAVENOUS | Status: DC | PRN

## 2022-10-04 MED ORDER — METOPROLOL SUCCINATE ER 50 MG PO TB24
50.0000 mg | ORAL_TABLET | Freq: Every day | ORAL | Status: DC
  Administered 2022-10-04 – 2022-10-06 (×3): 50 mg via ORAL
  Filled 2022-10-04 (×3): qty 1

## 2022-10-04 MED ORDER — FLEET ENEMA 7-19 GM/118ML RE ENEM: 1.0000 | ENEMA | Freq: Once | RECTAL | Status: DC | PRN

## 2022-10-04 MED ORDER — HYDROCHLOROTHIAZIDE 25 MG PO TABS
25.0000 mg | ORAL_TABLET | Freq: Every day | ORAL | Status: DC
  Administered 2022-10-04: 25 mg via ORAL
  Filled 2022-10-04: qty 1

## 2022-10-04 MED ORDER — INSULIN GLARGINE-YFGN 100 UNIT/ML ~~LOC~~ SOLN
30.0000 [IU] | Freq: Once | SUBCUTANEOUS | Status: AC
  Administered 2022-10-05: 30 [IU] via SUBCUTANEOUS
  Filled 2022-10-04: qty 0.3

## 2022-10-04 MED ORDER — HYDROCHLOROTHIAZIDE 25 MG PO TABS
25.0000 mg | ORAL_TABLET | Freq: Every day | ORAL | Status: DC
  Administered 2022-10-04 – 2022-10-07 (×4): 25 mg via ORAL
  Filled 2022-10-04 (×4): qty 1

## 2022-10-04 MED ORDER — ASPIRIN 81 MG PO CHEW
81.0000 mg | CHEWABLE_TABLET | Freq: Every day | ORAL | Status: DC
  Administered 2022-10-04 – 2022-10-07 (×4): 81 mg via ORAL
  Filled 2022-10-04 (×4): qty 1

## 2022-10-04 MED ORDER — DOCUSATE SODIUM 100 MG PO CAPS
100.0000 mg | ORAL_CAPSULE | Freq: Two times a day (BID) | ORAL | Status: DC
  Administered 2022-10-05 – 2022-10-07 (×4): 100 mg via ORAL
  Filled 2022-10-04 (×5): qty 1

## 2022-10-04 MED ORDER — ATORVASTATIN CALCIUM 10 MG PO TABS
20.0000 mg | ORAL_TABLET | Freq: Every day | ORAL | Status: DC
  Administered 2022-10-04 – 2022-10-07 (×4): 20 mg via ORAL
  Filled 2022-10-04 (×4): qty 2

## 2022-10-04 MED ORDER — EMPAGLIFLOZIN 25 MG PO TABS
25.0000 mg | ORAL_TABLET | ORAL | Status: AC
  Administered 2022-10-04: 25 mg via ORAL
  Filled 2022-10-04: qty 1

## 2022-10-04 MED ORDER — ACETAMINOPHEN 650 MG RE SUPP: 650.0000 mg | Freq: Four times a day (QID) | RECTAL | Status: DC | PRN

## 2022-10-04 MED ORDER — METFORMIN HCL ER 500 MG PO TB24
1000.0000 mg | ORAL_TABLET | Freq: Two times a day (BID) | ORAL | Status: DC
  Administered 2022-10-05 – 2022-10-07 (×5): 1000 mg via ORAL
  Filled 2022-10-04 (×6): qty 2

## 2022-10-04 MED ORDER — HYDROMORPHONE HCL 1 MG/ML IJ SOLN: 0.5000 mg | INTRAMUSCULAR | Status: DC | PRN

## 2022-10-04 MED ORDER — ZOLPIDEM TARTRATE 5 MG PO TABS: 5.0000 mg | ORAL_TABLET | Freq: Every evening | ORAL | Status: DC | PRN

## 2022-10-04 MED ORDER — EMPAGLIFLOZIN 25 MG PO TABS
25.0000 mg | ORAL_TABLET | Freq: Every day | ORAL | Status: DC
  Administered 2022-10-05 – 2022-10-07 (×3): 25 mg via ORAL
  Filled 2022-10-04 (×3): qty 1

## 2022-10-04 MED ORDER — BENAZEPRIL HCL 20 MG PO TABS
20.0000 mg | ORAL_TABLET | Freq: Every day | ORAL | Status: DC
  Administered 2022-10-04 – 2022-10-07 (×4): 20 mg via ORAL
  Filled 2022-10-04 (×4): qty 1

## 2022-10-04 MED ORDER — CYCLOBENZAPRINE HCL 5 MG PO TABS
5.0000 mg | ORAL_TABLET | Freq: Three times a day (TID) | ORAL | Status: DC | PRN
  Administered 2022-10-05 – 2022-10-06 (×2): 5 mg via ORAL
  Filled 2022-10-04 (×2): qty 1

## 2022-10-04 NOTE — ED Triage Notes (Signed)
Pt to ED via EMS from home with c/o lower back pain. Pt reports he had L4-L5 fusion on 7/8. Pt stated the pain has been getting worse and his pain meds are not helping. Pt denies any fevers, HA, or numbness or tingling in extremities. Pt Aox4 in triage, reports he may need assistance with rehabilitation stays.

## 2022-10-04 NOTE — H&P (Addendum)
Providing Compassionate, Quality Care - Together   Dwayne Nelson is an 67 y.o. male.   Chief Complaint: Back pain HPI: Mr. Dwayne Nelson has a history of skin cancer, type 2 diabetes mellitus, hyperlipidemia, and hypertension.  He underwent a lumbar decompression at L3, L4, and L5, with posterior fusion at L4-5 by Dr. Lovell Sheehan on 09/30/2022. He was doing well on 10/01/2022 and discharged home with home health PT and OT. Over the last two days, his pain has significantly worsened and he feels weaker. He is not able to perform his ADLs and is requiring more care than his son is able to provide. He feels he would do better in an inpatient rehabilitation setting.  Past Medical History:  Diagnosis Date   Cancer [facility])    LPFA skin cancer Dr. Terri Piedra   Diabetes mellitus without complication (HCC)    Hyperlipidemia    Hypertension     Past Surgical History:  Procedure Laterality Date   CIRCUMCISION N/A 03/29/2021   Procedure: DORSAL SLIT CIRCUMCISION ADULT;  Surgeon: Marcine Matar, MD;  Location: [facility];  Service: Urology;  Laterality: N/A;   colonoscopy N/A    NO PAST SURGERIES     SKIN CANCER EXCISION     With Dr Terri Piedra outpatient procedure    Family History  Problem Relation Age of Onset   Bladder Cancer Father    Social History:  reports that he has never smoked. He has never used smokeless tobacco. He reports that he does not currently use alcohol. He reports that he does not use drugs.  Allergies: No Known Allergies  (Not in a hospital admission)   Results for orders placed or performed during the hospital encounter of 10/04/22 (from the past 48 hour(s))  CBC     Status: None   Collection Time: 10/04/22  1:01 PM  Result Value Ref Range   WBC 7.3 4.0 - 10.5 K/uL   RBC 4.22 4.22 - 5.81 MIL/uL   Hemoglobin 13.0 13.0 - 17.0 g/dL   HCT 16.1 09.6 - 04.5 %   MCV 97.4 80.0 - 100.0 fL   MCH 30.8 26.0 - 34.0 pg   MCHC 31.6 30.0 - 36.0 g/dL   RDW 40.9 81.1 - 91.4 %    Platelets 239 150 - 400 K/uL   nRBC 0.0 0.0 - 0.2 %    Comment: Performed at [facility], [address], [place], [place] [address]  Basic metabolic panel     Status: Abnormal   Collection Time: 10/04/22  1:01 PM  Result Value Ref Range   Sodium 139 135 - 145 mmol/L   Potassium 4.0 3.5 - 5.1 mmol/L   Chloride 107 98 - 111 mmol/L   CO2 18 (L) 22 - 32 mmol/L   Glucose, Bld 143 (H) 70 - 99 mg/dL    Comment: Glucose reference range applies only to samples taken after fasting for at least 8 hours.   BUN 15 8 - 23 mg/dL   Creatinine, Ser 6.21 0.61 - 1.24 mg/dL   Calcium 9.0 8.9 - 30.8 mg/dL   GFR, Estimated >65 >78 mL/min    Comment: (NOTE) Calculated using the CKD-EPI Creatinine Equation (2021)    Anion gap 14 5 - 15    Comment: Performed at [facility], [address], [place], [place] [address]  Urinalysis, Routine w reflex microscopic -Urine, Clean Catch     Status: Abnormal   Collection Time: 10/04/22  1:01 PM  Result  Value Ref Range   Color, Urine YELLOW YELLOW   APPearance CLEAR CLEAR   Specific Gravity, Urine 1.029 1.005 - 1.030   pH 5.0 5.0 - 8.0   Glucose, UA >=500 (A) NEGATIVE mg/dL   Hgb urine dipstick NEGATIVE NEGATIVE   Bilirubin Urine NEGATIVE NEGATIVE   Ketones, ur 80 (A) NEGATIVE mg/dL   Protein, ur 161 (A) NEGATIVE mg/dL   Nitrite NEGATIVE NEGATIVE   Leukocytes,Ua TRACE (A) NEGATIVE   RBC / HPF 0-5 0 - 5 RBC/hpf   WBC, UA 6-10 0 - 5 WBC/hpf   Bacteria, UA RARE (A) NONE SEEN   Squamous Epithelial / HPF 0-5 0 - 5 /HPF   Mucus PRESENT     Comment: Performed at [facility], [address], [place], [place] [address]   MR LUMBAR SPINE WO CONTRAST  Result Date: 10/04/2022 CLINICAL DATA:  Low back pain, cauda equina syndrome suspected EXAM: MRI LUMBAR SPINE WITHOUT CONTRAST TECHNIQUE: Multiplanar, multisequence MR imaging of the lumbar spine was performed. No intravenous contrast was administered. COMPARISON:  Lumbar spine MRI 08/31/22 FINDINGS:  Segmentation:  Standard. Alignment:  Physiologic. Vertebrae: Postsurgical changes from interval L4-L5 posterior spinal fusion and decompression. There is new marrow edema in the L5 vertebral body, most likely postsurgical. Conus medullaris and cauda equina: Conus extends to the L1-L2 disc space level. Conus and cauda equina appear normal. Paraspinal and other soft tissues: There is a subcutaneous soft tissue fluid collection/hematoma in the lower back measuring 7.7 x 2.1 x 5.2 cm (series 8, image 27). There is edema in the paraspinal musculature surrounding the surgical site. There is a small fluid collection abutting the right transpedicular screw at the L4 vertebral body level (series 8, image 22). This fluid collection causes mass effect on the thecal sac, which is severely narrowed (series 8, image 22). Disc levels: Compared to the preoperative MRI there is decreased thecal sac narrowing of the L4-L5 disc space level (series 8, image 26). There is also decreased narrowing of the thecal sac at the L4 level (series 8, image 24). There is likely increased thecal sac narrowing of the L3-L4 disc space level secondary to the posterior paraspinal fluid collection (series 8, image 22). 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 Postsurgical changes from interval L4-L5 posterior spinal fusion and decompression. There is a small fluid collection abutting the right transpedicular screw at the L4 vertebral body level, which causes mass effect on the thecal sac, which is severely narrowed at the L3-L4 disp space level (increased from prior). 2. There is decreased thecal sac narrowing at the L4-L5 disc space level after decompression. 3. Subcutaneous soft tissue fluid collection/hematoma in the lower back measuring 7.7 x 2.1 x 5.2 cm. 4. Decreased thecal sac narrowing at the L4-L5 and L4 levels. Electronically Signed   By: Lorenza Cambridge M.D.   On: 10/04/2022 12:40    Review  of Systems  Constitutional: Negative.   HENT: Negative.    Eyes: Negative.   Respiratory: Negative.    Cardiovascular: Negative.   Gastrointestinal: Negative.   Endocrine: Negative.   Genitourinary: Negative.   Musculoskeletal:  Positive for back pain.  Skin: Negative.   Allergic/Immunologic: Negative.   Neurological: Negative.   Hematological: Negative.   Psychiatric/Behavioral: Negative.      Blood pressure (!) 145/72, pulse 78, temperature 98.3 F (36.8 C), temperature source Oral, resp. rate (!) 21, height 5\' 10"  (1.778 m), weight 114 kg, SpO2 95%. Physical Exam  Constitutional:      Appearance: Normal appearance.  HENT:     Head: Normocephalic and atraumatic.     Nose: Nose normal.     Mouth/Throat:     Mouth: Mucous membranes are moist.     Pharynx: Oropharynx is clear.  Eyes:     Extraocular Movements: Extraocular movements intact.     Conjunctiva/sclera: Conjunctivae normal.     Pupils: Pupils are equal, round, and reactive to light.  Cardiovascular:     Rate and Rhythm: Normal rate and regular rhythm.     Pulses: Normal pulses.  Pulmonary:     Effort: Pulmonary effort is normal. No respiratory distress.  Abdominal:     Palpations: Abdomen is soft.  Musculoskeletal:        General: Tenderness present. Normal range of motion.     Cervical back: Normal range of motion and neck supple.  Skin:    General: Skin is warm and dry.     Capillary Refill: Capillary refill takes less than 2 seconds.          Comments: Surgical wound still has the Honeycomb dressing that is peeling up at the edge. Dressing removed. Steri Strip peeling off at the bottom portion of the surgical wound. These have also been removed. There is dried sanguinous drainage on the remaining steri strips.  The surgical wound itself is well-approximated and without s/s of infection.   Neurological:     General: No focal deficit present.     Mental Status: He is alert and oriented to person, place, and  time.  Psychiatric:        Mood and Affect: Mood normal.        Behavior: Behavior normal.      Assessment/Plan Mr. Plasencia underwent lumbar surgery on 09/30/2022 with Dr. Lovell Sheehan. He had worsening pain and weakness and returned to the emergency department. Follow up imaging demonstrated anticipated post operative findings with stable-appearing stenosis at L3-4 and decreased thecal sac narrowing at L4-5. Mr. Largent will be admitted to the hospital for pain control and to continue his work with therapies. His discharge plan will depend on PT and OT's recommendations.    Floreen Comber, NP 10/04/2022, 2:19 PM   I have reviewed the patient's lumbar MRI.  It demonstrates expected postoperative changes at L3-4 and L4-5  With moderate stenosis.  I think he will improve with time, therapy, etcetera.  It looks like he will need rehab versus skilled nursing facility placement.  I will ask the rehab team to see him.

## 2022-10-04 NOTE — ED Provider Notes (Signed)
[place] EMERGENCY DEPARTMENT AT [facility] Provider Note   CSN: [id number] Arrival date & time: 10/04/22  1004     History  Chief Complaint  Patient presents with   Back Pain    Dwayne Nelson is a 67 y.o. male with medical history of diabetes, hypertension, hyperlipidemia, recent bilateral L3-4 and L4-5 laminotomy/foraminotomies/medial facetectomy to decompress the bilateral L3, L4 and L5 nerve roots with Dr. Lovell Sheehan and neurosurgery.  The patient presents to the ED with his son for evaluation of increased low back pain as well as need for rehab placement.  The patient reports that he had the above-stated surgery on 7/8.  Patient reports that he was discharged home at this time and has been staying with his son.  The patient's son at the bedside reports that the patient has been very hard to take care of.  The patient's son states he has had to take the last week off of work to take care of his father and he reports that he is unable to take off any more time.  Patient son states he is unable to take care of his father at home.  Patient's son reports that he has been unable to give the patient his home medications for unknown reasons ever since being discharged in the hospital.  They called neurosurgery office this morning who advised him to come to the ED for placement into SNF.  Patient reporting increased weakness to lower extremities but states that he had increased weakness before surgery, goes on to state that the weakness is more severe and noticeable yesterday but today seems to be decreasing.  Patient son reports that the patient believes that the neurosurgeon "screwed up" his surgery. Patient denies any bowel or bladder incontinence, groin numbness.  He denies any chest pain, shortness of breath, fevers, body aches or chills, nausea or vomiting.  He reports he is been taking home oxycodone which will slightly alleviate pain but does not entirely resolve pain.   Back  Pain Associated symptoms: weakness   Associated symptoms: no abdominal pain, no chest pain, no dysuria, no fever and no numbness        Home Medications Prior to Admission medications   Medication Sig Start Date End Date Taking? Authorizing Provider  acetaminophen (TYLENOL) 650 MG CR tablet Take 1,300 mg by mouth every 8 (eight) hours as needed for pain.    [provider]  amLODipine-benazepril (LOTREL) 5-20 MG capsule Take 1 capsule by mouth at bedtime.    [provider]  aspirin 81 MG chewable tablet Chew 81 mg by mouth daily.    [provider]  atorvastatin (LIPITOR) 20 MG tablet Take 20 mg by mouth daily.    [provider]  cetirizine (ZYRTEC) 10 MG tablet Take 10 mg by mouth daily.    [provider]  cyclobenzaprine (FLEXERIL) 5 MG tablet Take 1 tablet (5 mg total) by mouth 3 (three) times daily as needed for muscle spasms. 10/01/22   Val Eagle D, NP  docusate sodium (COLACE) 100 MG capsule Take 1 capsule (100 mg total) by mouth 2 (two) times daily. 10/01/22   Val Eagle D, NP  empagliflozin (JARDIANCE) 25 MG TABS tablet Take 25 mg by mouth daily.    [provider]  gabapentin (NEURONTIN) 300 MG capsule Take 300 mg by mouth 3 (three) times daily. 08/23/22   [provider]  hydrochlorothiazide (HYDRODIURIL) 25 MG tablet Take 25 mg by mouth daily.  [provider]  Insulin Glargine-Lixisenatide (SOLIQUA) 100-33 UNT-MCG/ML SOPN Inject 30 Units into the skin in the morning and at bedtime.    [provider]  metFORMIN (GLUCOPHAGE-XR) 500 MG 24 hr tablet Take 1,000 mg by mouth 2 (two) times daily with a meal.    [provider]  metoprolol succinate (TOPROL-XL) 50 MG 24 hr tablet Take 50 mg by mouth at bedtime. Take with or immediately following a meal.    [provider]  Multiple Vitamins-Minerals (CENTRUM SILVER ULTRA MENS PO) Take 1 tablet by mouth daily with breakfast.     [provider]  oxyCODONE-acetaminophen (PERCOCET) 5-325 MG tablet Take 1 tablet by mouth every 4 (four) hours as needed for severe pain. 10/01/22 10/01/23  Val Eagle D, NP  pioglitazone (ACTOS) 30 MG tablet Take 30 mg by mouth at bedtime.    [provider]  Testosterone 30 MG/ACT SOLN Apply 2 Pump topically daily. 07/25/22   [provider]      Allergies    Patient has no known allergies.    Review of Systems   Review of Systems  Constitutional:  Negative for fever.  Respiratory:  Negative for shortness of breath.   Cardiovascular:  Negative for chest pain.  Gastrointestinal:  Negative for abdominal pain, nausea and vomiting.  Genitourinary:  Negative for dysuria.  Musculoskeletal:  Positive for back pain.  Neurological:  Positive for weakness. Negative for numbness.  All other systems reviewed and are negative.   Physical Exam Updated Vital Signs BP (!) 145/72   Pulse 78   Temp 98.3 F (36.8 C) (Oral)   Resp (!) 21   Ht 5\' 10"  (1.778 m)   Wt 114 kg   SpO2 95%   BMI 36.06 kg/m  Physical Exam Vitals and nursing note reviewed.  Constitutional:      General: He is not in acute distress.    Appearance: Normal appearance. He is not ill-appearing, toxic-appearing or diaphoretic.  HENT:     Head: Normocephalic and atraumatic.     Nose: Nose normal.     Mouth/Throat:     Mouth: Mucous membranes are moist.     Pharynx: Oropharynx is clear. No oropharyngeal exudate.  Eyes:     Extraocular Movements: Extraocular movements intact.     Conjunctiva/sclera: Conjunctivae normal.     Pupils: Pupils are equal, round, and reactive to light.  Cardiovascular:     Rate and Rhythm: Normal rate and regular rhythm.  Pulmonary:     Effort: Pulmonary effort is normal.     Breath sounds: Normal breath sounds. No wheezing.  Abdominal:     General: Abdomen is flat. Bowel sounds are normal.     Palpations: Abdomen is soft.     Tenderness: There is no abdominal  tenderness.  Musculoskeletal:     Comments: Postsurgical incision site appears clean, dry but does have surrounding erythema.  Skin:    General: Skin is warm and dry.     Capillary Refill: Capillary refill takes less than 2 seconds.  Neurological:     General: No focal deficit present.     Mental Status: He is alert and oriented to person, place, and time.     GCS: GCS eye subscore is 4. GCS verbal subscore is 5. GCS motor subscore is 6.     Cranial Nerves: Cranial nerves 2-12 are intact. No cranial nerve deficit.     Sensory: Sensation is intact. No sensory deficit.     Motor:  Motor function is intact. No weakness.     Comments: 5 out of 5 strength bilateral lower extremities     ED Results / Procedures / Treatments   Labs (all labs ordered are listed, but only abnormal results are displayed) Labs Reviewed  BASIC METABOLIC PANEL - Abnormal; Notable for the following components:      Result Value   CO2 18 (*)    Glucose, Bld 143 (*)    All other components within normal limits  URINALYSIS, ROUTINE W REFLEX MICROSCOPIC - Abnormal; Notable for the following components:   Glucose, UA >=500 (*)    Ketones, ur 80 (*)    Protein, ur 100 (*)    Leukocytes,Ua TRACE (*)    Bacteria, UA RARE (*)    All other components within normal limits  CBC  CBG MONITORING, ED    EKG None  Radiology MR LUMBAR SPINE WO CONTRAST  Result Date: 10/04/2022 CLINICAL DATA:  Low back pain, cauda equina syndrome suspected EXAM: MRI LUMBAR SPINE WITHOUT CONTRAST TECHNIQUE: Multiplanar, multisequence MR imaging of the lumbar spine was performed. No intravenous contrast was administered. COMPARISON:  Lumbar spine MRI 08/31/22 FINDINGS: Segmentation:  Standard. Alignment:  Physiologic. Vertebrae: Postsurgical changes from interval L4-L5 posterior spinal fusion and decompression. There is new marrow edema in the L5 vertebral body, most likely postsurgical. Conus medullaris and cauda equina: Conus extends to the  L1-L2 disc space level. Conus and cauda equina appear normal. Paraspinal and other soft tissues: There is a subcutaneous soft tissue fluid collection/hematoma in the lower back measuring 7.7 x 2.1 x 5.2 cm (series 8, image 27). There is edema in the paraspinal musculature surrounding the surgical site. There is a small fluid collection abutting the right transpedicular screw at the L4 vertebral body level (series 8, image 22). This fluid collection causes mass effect on the thecal sac, which is severely narrowed (series 8, image 22). Disc levels: Compared to the preoperative MRI there is decreased thecal sac narrowing of the L4-L5 disc space level (series 8, image 26). There is also decreased narrowing of the thecal sac at the L4 level (series 8, image 24). There is likely increased thecal sac narrowing of the L3-L4 disc space level secondary to the posterior paraspinal fluid collection (series 8, image 22). 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 Postsurgical changes from interval L4-L5 posterior spinal fusion and decompression. There is a small fluid collection abutting the right transpedicular screw at the L4 vertebral body level, which causes mass effect on the thecal sac, which is severely narrowed at the L3-L4 disp space level (increased from prior). 2. There is decreased thecal sac narrowing at the L4-L5 disc space level after decompression. 3. Subcutaneous soft tissue fluid collection/hematoma in the lower back measuring 7.7 x 2.1 x 5.2 cm. 4. Decreased thecal sac narrowing at the L4-L5 and L4 levels. Electronically Signed   By: Lorenza Cambridge M.D.   On: 10/04/2022 12:40    Procedures Procedures   Medications Ordered in ED Medications  hydrochlorothiazide (HYDRODIURIL) tablet 25 mg (25 mg Oral Given 10/04/22 1256)  HYDROmorphone (DILAUDID) injection 0.5 mg (0.5 mg Intravenous Given 10/04/22 1256)  gabapentin (NEURONTIN) capsule 300 mg (300 mg Oral  Given 10/04/22 1256)  empagliflozin (JARDIANCE) tablet 25 mg (25 mg Oral Given 10/04/22 1258)    ED Course/ Medical Decision Making/ A&P    Medical Decision Making Amount and/or Complexity of Data Reviewed Labs: ordered. Radiology: ordered.  Risk Prescription drug  management. Decision regarding hospitalization.   67 year old male presents to ED for evaluation.  Please see HPI for further details.  On examination patient is afebrile and nontachycardic.  His lung sounds are clear bilaterally and he is not hypoxic.  His abdomen is soft and compressible throughout.  Neurological examination at baseline without focal neurodeficits, 5 out of 5 strength bilateral lower extremities.  Patient surgical incision site is clean, dry but does have surrounding erythema.  Patient overall nontoxic in appearance.  Patient given 1 mg Dilaudid for pain control.  Patient has had home medications ordered as son reports that he has not been giving him home medications.  Patient CBC without leukocytosis or anemia.  Metabolic panel without electrolyte derangement, no elevated creatinine, anion gap 14.  Urinalysis shows protein, ketones, glucose, trace leukocytes and bacteria.  Patient denies dysuria.  Patient MR lumbar spine without contrast shows postsurgical changes from interval L4-L5 posterior spinal fusion and decompression.  There is a small fluid collection abutting the right transpedicular screw at the L4 vertebral body level which causes mass effect on the thecal sac which is severely narrowed and increased from prior examinations.  Reached out to Dr. Lovell Sheehan, neurosurgery, who stated that they will admit the patient for further management.  Patient stable at time of admission.  Patient amenable to plan.   Final Clinical Impression(s) / ED Diagnoses Final diagnoses:  Post-op pain    Rx / DC Orders ED Discharge Orders     None         Al Decant, PA-C 10/04/22 1430    Long,  Arlyss Repress, MD 10/09/22 1140

## 2022-10-04 NOTE — ED Notes (Signed)
.ED TO INPATIENT HANDOFF REPORT  ED Nurse Name and Phone #: [phone]  S Name/Age/Gender Dwayne Nelson 67 y.o. male Room/Bed: 024C/024C  Code Status   Code Status: Full Code  Home/SNF/Other Home Patient oriented to: self, place, time, and situation Is this baseline? Yes   Triage Complete: Triage complete  Chief Complaint Spondylolisthesis of lumbar region [M43.16]  Triage Note Pt to ED via EMS from home with c/o lower back pain. Pt reports he had L4-L5 fusion on 7/8. Pt stated the pain has been getting worse and his pain meds are not helping. Pt denies any fevers, HA, or numbness or tingling in extremities. Pt Aox4 in triage, reports he may need assistance with rehabilitation stays.   Allergies No Known Allergies  Level of Care/Admitting Diagnosis ED Disposition     ED Disposition  Admit   Condition  --   Comment  Hospital Area: [facility] [[id number]]  Level of Care: Med-Surg [16]  May admit patient to [facility] or [facility] if equivalent level of care is available:: No  Covid Evaluation: Asymptomatic - no recent exposure (last 10 days) testing not required  Diagnosis: Spondylolisthesis of lumbar region [295284]  Admitting Physician: Tressie Stalker [[id number]]  Attending Physician: Lovell Sheehan, JEFFREY [[id number]]  Certification:: I certify this patient will need inpatient services for at least 2 midnights  Estimated Length of Stay: 3          B Medical/Surgery History Past Medical History:  Diagnosis Date   Cancer (HCC)    LPFA skin cancer Dr. Terri Piedra   Diabetes mellitus without complication (HCC)    Hyperlipidemia    Hypertension    Past Surgical History:  Procedure Laterality Date   CIRCUMCISION N/A 03/29/2021   Procedure: DORSAL SLIT CIRCUMCISION ADULT;  Surgeon: Marcine Matar, MD;  Location: [facility];  Service: Urology;  Laterality: N/A;   colonoscopy N/A    NO PAST SURGERIES     SKIN CANCER EXCISION     With Dr Terri Piedra  outpatient procedure     A IV Location/Drains/Wounds Patient Lines/Drains/Airways Status     Active Line/Drains/Airways     Name Placement date Placement time Site Days   Peripheral IV 10/04/22 20 G Anterior;Right Hand 10/04/22  1255  Hand  less than 1   Wound / Incision (Open or Dehisced) 05/18/22 Non-pressure wound Arm Left;Lower;Posterior linear up arm due to fall 05/18/22  --  Arm  139   Wound / Incision (Open or Dehisced) 05/18/22 Laceration Arm Lower;Posterior;Right lacerations from fall 05/18/22  --  Arm  139            Intake/Output Last 24 hours No intake or output data in the 24 hours ending 10/04/22 1431  Labs/Imaging Results for orders placed or performed during the hospital encounter of 10/04/22 (from the past 48 hour(s))  CBC     Status: None   Collection Time: 10/04/22  1:01 PM  Result Value Ref Range   WBC 7.3 4.0 - 10.5 K/uL   RBC 4.22 4.22 - 5.81 MIL/uL   Hemoglobin 13.0 13.0 - 17.0 g/dL   HCT 13.2 44.0 - 10.2 %   MCV 97.4 80.0 - 100.0 fL   MCH 30.8 26.0 - 34.0 pg   MCHC 31.6 30.0 - 36.0 g/dL   RDW 72.5 36.6 - 44.0 %   Platelets 239 150 - 400 K/uL   nRBC 0.0 0.0 - 0.2 %    Comment: Performed at [facility], [address] [place], [place] [address]  Basic metabolic panel     Status: Abnormal   Collection Time: 10/04/22  1:01 PM  Result Value Ref Range   Sodium 139 135 - 145 mmol/L   Potassium 4.0 3.5 - 5.1 mmol/L   Chloride 107 98 - 111 mmol/L   CO2 18 (L) 22 - 32 mmol/L   Glucose, Bld 143 (H) 70 - 99 mg/dL    Comment: Glucose reference range applies only to samples taken after fasting for at least 8 hours.   BUN 15 8 - 23 mg/dL   Creatinine, Ser 6.04 0.61 - 1.24 mg/dL   Calcium 9.0 8.9 - 54.0 mg/dL   GFR, Estimated >98 >11 mL/min    Comment: (NOTE) Calculated using the CKD-EPI Creatinine Equation (2021)    Anion gap 14 5 - 15    Comment: Performed at [facility], [address], [place], [place] [address]  Urinalysis, Routine  w reflex microscopic -Urine, Clean Catch     Status: Abnormal   Collection Time: 10/04/22  1:01 PM  Result Value Ref Range   Color, Urine YELLOW YELLOW   APPearance CLEAR CLEAR   Specific Gravity, Urine 1.029 1.005 - 1.030   pH 5.0 5.0 - 8.0   Glucose, UA >=500 (A) NEGATIVE mg/dL   Hgb urine dipstick NEGATIVE NEGATIVE   Bilirubin Urine NEGATIVE NEGATIVE   Ketones, ur 80 (A) NEGATIVE mg/dL   Protein, ur 295 (A) NEGATIVE mg/dL   Nitrite NEGATIVE NEGATIVE   Leukocytes,Ua TRACE (A) NEGATIVE   RBC / HPF 0-5 0 - 5 RBC/hpf   WBC, UA 6-10 0 - 5 WBC/hpf   Bacteria, UA RARE (A) NONE SEEN   Squamous Epithelial / HPF 0-5 0 - 5 /HPF   Mucus PRESENT     Comment: Performed at [facility], [address], [place], [place] [address]   MR LUMBAR SPINE WO CONTRAST  Result Date: 10/04/2022 CLINICAL DATA:  Low back pain, cauda equina syndrome suspected EXAM: MRI LUMBAR SPINE WITHOUT CONTRAST TECHNIQUE: Multiplanar, multisequence MR imaging of the lumbar spine was performed. No intravenous contrast was administered. COMPARISON:  Lumbar spine MRI 08/31/22 FINDINGS: Segmentation:  Standard. Alignment:  Physiologic. Vertebrae: Postsurgical changes from interval L4-L5 posterior spinal fusion and decompression. There is new marrow edema in the L5 vertebral body, most likely postsurgical. Conus medullaris and cauda equina: Conus extends to the L1-L2 disc space level. Conus and cauda equina appear normal. Paraspinal and other soft tissues: There is a subcutaneous soft tissue fluid collection/hematoma in the lower back measuring 7.7 x 2.1 x 5.2 cm (series 8, image 27). There is edema in the paraspinal musculature surrounding the surgical site. There is a small fluid collection abutting the right transpedicular screw at the L4 vertebral body level (series 8, image 22). This fluid collection causes mass effect on the thecal sac, which is severely narrowed (series 8, image 22). Disc levels: Compared to the preoperative  MRI there is decreased thecal sac narrowing of the L4-L5 disc space level (series 8, image 26). There is also decreased narrowing of the thecal sac at the L4 level (series 8, image 24). There is likely increased thecal sac narrowing of the L3-L4 disc space level secondary to the posterior paraspinal fluid collection (series 8, image 22). 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 Postsurgical changes from interval L4-L5 posterior spinal fusion and decompression. There is a small fluid collection abutting the right transpedicular  screw at the L4 vertebral body level, which causes mass effect on the thecal sac, which is severely narrowed at the L3-L4 disp space level (increased from prior). 2. There is decreased thecal sac narrowing at the L4-L5 disc space level after decompression. 3. Subcutaneous soft tissue fluid collection/hematoma in the lower back measuring 7.7 x 2.1 x 5.2 cm. 4. Decreased thecal sac narrowing at the L4-L5 and L4 levels. Electronically Signed   By: Lorenza Cambridge M.D.   On: 10/04/2022 12:40    Pending Labs Unresulted Labs (From admission, onward)    None       Vitals/Pain Today's Vitals   10/04/22 1130 10/04/22 1145 10/04/22 1300 10/04/22 1315  BP: (!) 148/85 (!) 149/87  (!) 145/72  Pulse: 77 65 78   Resp: 20 (!) 21    Temp:      TempSrc:      SpO2: 94% 94% 95%   Weight:      Height:      PainSc:        Isolation Precautions No active isolations  Medications Medications  hydrochlorothiazide (HYDRODIURIL) tablet 25 mg (25 mg Oral Given 10/04/22 1256)  HYDROmorphone (DILAUDID) injection 0.5 mg (0.5 mg Intravenous Given 10/04/22 1256)  gabapentin (NEURONTIN) capsule 300 mg (300 mg Oral Given 10/04/22 1256)  empagliflozin (JARDIANCE) tablet 25 mg (25 mg Oral Given 10/04/22 1258)    Mobility non-ambulatory     Focused Assessments Neuro Assessment Handoff:  Swallow screen pass? Yes          Neuro Assessment:  Within Defined Limits Neuro Checks:      Has TPA been given? No If patient is a Neuro Trauma and patient is going to OR before floor call report to 4N Charge nurse: [phone] or [phone]   R Recommendations: See Admitting Provider Note  Report given to:   Additional Notes: Pt Aox4, ambulatory with assistance, post op lumbar surgery. Pain management controlled

## 2022-10-04 NOTE — Progress Notes (Signed)
Pharmacy stated that they will come to review pts meds

## 2022-10-05 LAB — GLUCOSE, CAPILLARY
Glucose-Capillary: 139 mg/dL — ABNORMAL HIGH (ref 70–99)
Glucose-Capillary: 242 mg/dL — ABNORMAL HIGH (ref 70–99)
Glucose-Capillary: 200 mg/dL — ABNORMAL HIGH (ref 70–99)

## 2022-10-05 MED ORDER — ENSURE ENLIVE PO LIQD
237.0000 mL | Freq: Two times a day (BID) | ORAL | Status: DC
  Administered 2022-10-05 – 2022-10-07 (×3): 237 mL via ORAL

## 2022-10-05 MED ORDER — INSULIN GLARGINE-YFGN 100 UNIT/ML ~~LOC~~ SOLN
30.0000 [IU] | Freq: Once | SUBCUTANEOUS | Status: AC
  Administered 2022-10-05: 30 [IU] via SUBCUTANEOUS
  Filled 2022-10-05: qty 0.3

## 2022-10-05 MED ORDER — INSULIN GLARGINE-YFGN 100 UNIT/ML ~~LOC~~ SOLN
30.0000 [IU] | Freq: Two times a day (BID) | SUBCUTANEOUS | Status: DC
  Administered 2022-10-05 – 2022-10-07 (×4): 30 [IU] via SUBCUTANEOUS
  Filled 2022-10-05 (×5): qty 0.3

## 2022-10-05 NOTE — Progress Notes (Signed)
Neurosurgery Service Progress Note  Subjective: No acute events overnight. States that he is feeling better today with improvement in his pain and weakness.    Objective: Vitals:   10/04/22 2237 10/05/22 0018 10/05/22 0510 10/05/22 0840  BP: (!) 155/88 (!) 154/80 134/80 (!) 147/80  Pulse: 91 80 60 (!) 58  Resp:   18 18  Temp:   98.1 F (36.7 C) 98.4 F (36.9 C)  TempSrc:   Oral   SpO2:   96% 96%  Weight:   105.7 kg   Height:        Physical Exam: Strength 5/5 x4 and SILTx4, incision c/d/I   Assessment & Plan: 67 y.o. male s/p L3-5 laminectomy with L4-5 PSF on 09/30/22, improving with PT.  -PT/OT recs  -continue current medications  -will see how he progresses with PT to see if he is a candidate for CIR vs SNF  Emilee Hero, PA-C 10/05/22 9:36 AM

## 2022-10-05 NOTE — Evaluation (Signed)
Physical Therapy Evaluation Patient Details Name: Dwayne Nelson MRN: [id number] DOB: 06/26/1955 Today's Date: 10/05/2022  History of Present Illness  67 y.o. male s/p Bilateral L3-4 and L4-5 laminotomy/foraminotomies/medial facetectomy (09/30/2022). PMH significant for: CA, DM, HLD, and HTN.  Clinical Impression   Pt presents with generalized weakness, impaired balance, severe back pain, impaired gait, and decreased activity tolerance. Pt to benefit from acute PT to address deficits. Pt ambulated short room distance to/from bathroom, limited in progression by severe back pain. Pt requiring min PT assist throughout mobility. Would benefit from intensive post-acute inpatient rehabilitation prior to d/c home to address deficits. PT to progress mobility as tolerated, and will continue to follow acutely.          Assistance Recommended at Discharge Intermittent Supervision/Assistance  If plan is discharge home, recommend the following:  Can travel by private vehicle  A lot of help with walking and/or transfers;A lot of help with bathing/dressing/bathroom        Equipment Recommendations None recommended by PT  Recommendations for Other Services       Functional Status Assessment Patient has had a recent decline in their functional status and demonstrates the ability to make significant improvements in function in a reasonable and predictable amount of time.     Precautions / Restrictions Precautions Precautions: Fall;Back Precaution Booklet Issued: Yes (comment) Precaution Comments: reviewed spinal precautions during functional mobility Required Braces or Orthoses: Spinal Brace Spinal Brace: Lumbar corset;Applied in sitting position Restrictions Weight Bearing Restrictions: No      Mobility  Bed Mobility Overal bed mobility: Needs Assistance Bed Mobility: Sidelying to Sit, Sit to Sidelying, Rolling Rolling: Supervision Sidelying to sit: Min assist       General bed  mobility comments: assist for trunk elevation    Transfers Overall transfer level: Needs assistance Equipment used: Rolling walker (2 wheels) Transfers: Sit to/from Stand Sit to Stand: Min assist           General transfer comment: assist for rise and steady, especially from low surface of toilet. stand x2, from EOB and toilet. cues for hand placement    Ambulation/Gait Ambulation/Gait assistance: Min assist Gait Distance (Feet): 25 Feet Assistive device: Rolling walker (2 wheels) Gait Pattern/deviations: Step-through pattern, Decreased stride length, Shuffle, Trunk flexed Gait velocity: decr     General Gait Details: assist for steadying and RW management, cues for RW proximity, upright posture  Stairs            Wheelchair Mobility     Tilt Bed    Modified Rankin (Stroke Patients Only)       Balance Overall balance assessment: Needs assistance Sitting-balance support: No upper extremity supported, Feet supported Sitting balance-Leahy Scale: Good     Standing balance support: Bilateral upper extremity supported, During functional activity, Reliant on assistive device for balance Standing balance-Leahy Scale: Poor Standing balance comment: Reliant on RW support in static stance and functional mobility.                             Pertinent Vitals/Pain Pain Assessment Pain Assessment: 0-10 Pain Score: 8  Pain Location: back Pain Descriptors / Indicators: Operative site guarding, Discomfort, Sore Pain Intervention(s): Limited activity within patient's tolerance, Monitored during session, Repositioned    Home Living Family/patient expects to be discharged to:: Private residence Living Arrangements: Alone (has lived with son since March) Available Help at Discharge: Family;Available PRN/intermittently Type of Home: House Home Access: Stairs to  enter Entrance Stairs-Rails: Right Entrance Stairs-Number of Steps: 2   Home Layout: One  level Home Equipment: Agricultural consultant (2 wheels);Shower seat;Wheelchair - manual Additional Comments: Following discharge, will live at [facility] house for a week.    Prior Function Prior Level of Function : Independent/Modified Independent;Driving             Mobility Comments: Independent with intermittent use of walking stick or RW due to pain. ADLs Comments: independent     Hand Dominance   Dominant Hand: Right    Extremity/Trunk Assessment   Upper Extremity Assessment Upper Extremity Assessment: Defer to OT evaluation    Lower Extremity Assessment Lower Extremity Assessment: Generalized weakness RLE Deficits / Details: RLE presents with muscular atrophy at calves.    Cervical / Trunk Assessment Cervical / Trunk Assessment: Back Surgery  Communication   Communication: No difficulties  Cognition Arousal/Alertness: Awake/alert Behavior During Therapy: Anxious Overall Cognitive Status: Within Functional Limits for tasks assessed                                          General Comments General comments (skin integrity, edema, etc.): pt voiced feelings of depression and decreased appetite, notified RN    Exercises     Assessment/Plan    PT Assessment Patient needs continued PT services  PT Problem List Decreased strength;Decreased range of motion;Decreased activity tolerance;Decreased balance;Decreased mobility;Decreased coordination;Decreased knowledge of use of DME;Pain       PT Treatment Interventions DME instruction;Gait training;Stair training;Functional mobility training;Therapeutic activities;Therapeutic exercise;Patient/family education;Balance training;Neuromuscular re-education    PT Goals (Current goals can be found in the Care Plan section)  Acute Rehab PT Goals PT Goal Formulation: With patient Time For Goal Achievement: 10/19/22 Potential to Achieve Goals: Good    Frequency Min 4X/week     Co-evaluation                AM-PAC PT "6 Clicks" Mobility  Outcome Measure Help needed turning from your back to your side while in a flat bed without using bedrails?: A Little Help needed moving from lying on your back to sitting on the side of a flat bed without using bedrails?: A Little Help needed moving to and from a bed to a chair (including a wheelchair)?: A Lot Help needed standing up from a chair using your arms (e.g., wheelchair or bedside chair)?: A Little Help needed to walk in hospital room?: A Lot Help needed climbing 3-5 steps with a railing? : Total 6 Click Score: 14    End of Session   Activity Tolerance: Patient limited by pain Patient left: in bed;with call bell/phone within reach Nurse Communication: Mobility status PT Visit Diagnosis: Muscle weakness (generalized) (M62.81);History of falling (Z91.81);Difficulty in walking, not elsewhere classified (R26.2)    Time: 1315-1340 PT Time Calculation (min) (ACUTE ONLY): 25 min   Charges:   PT Evaluation $PT Eval Low Complexity: 1 Low   PT General Charges $$ ACUTE PT VISIT: 1 Visit         Marye Round, PT DPT Acute Rehabilitation Services Secure Chat Preferred  Office [phone]   Shaquana Buel E Christain [place] 10/05/2022, 2:30 PM

## 2022-10-05 NOTE — Progress Notes (Signed)
Inpatient Rehab Admissions Coordinator:  Consult received. Await therapy evaluations to help determine appropriate rehab venue.   Kalijah Zeiss Graves Madden, MS, CCC-SLP Admissions Coordinator [phone]  

## 2022-10-05 NOTE — Plan of Care (Signed)

## 2022-10-05 NOTE — Evaluation (Signed)
Occupational Therapy Evaluation Patient Details Name: Dwayne Nelson MRN: [id number] DOB: [birth year] Today's Date: 10/05/2022   History of Present Illness 67 y.o. male s/p Bilateral L3-4 and L4-5 laminotomy/foraminotomies/medial facetectomy (09/30/2022). PMH significant for: CA, DM, HLD, and HTN.   Clinical Impression   Pt received in bed, agreeable to OT session. Pt reports PTA, he was independent with ADL/IADL and functional mobility. Pt currently requires minA with sit<>stand transfers and LB dressing. He requires minguard for functional mobility at RW level. Pt requires minguard assistance for donning/doffing back brace. Pt will continue to benefit from skilled OT services to maximize safety and independence with ADL/IADL and functional mobility. Will continue to follow acutely and progress as tolerated.        Recommendations for follow up therapy are one component of a multi-disciplinary discharge planning process, led by the attending physician.  Recommendations may be updated based on patient status, additional functional criteria and insurance authorization.   Assistance Recommended at Discharge Intermittent Supervision/Assistance  Patient can return home with the following A little help with walking and/or transfers;A little help with bathing/dressing/bathroom;Assistance with cooking/housework    Functional Status Assessment  Patient has had a recent decline in their functional status and demonstrates the ability to make significant improvements in function in a reasonable and predictable amount of time.  Equipment Recommendations  BSC/3in1    Recommendations for Other Services       Precautions / Restrictions Precautions Precautions: Fall;Back Precaution Booklet Issued: Yes (comment) Precaution Comments: Educated and reviewed Spinal precautions throughout session. Required Braces or Orthoses: Spinal Brace Spinal Brace: Lumbar corset;Applied in sitting  position Restrictions Weight Bearing Restrictions: No      Mobility Bed Mobility Overal bed mobility: Needs Assistance Bed Mobility: Sidelying to Sit, Sit to Sidelying, Rolling Rolling: Supervision Sidelying to sit: Min assist     Sit to sidelying: Supervision General bed mobility comments: minA to progress sidelying to sit. pt with good adherence to back precautions during transfers    Transfers Overall transfer level: Needs assistance Equipment used: Rolling walker (2 wheels) Transfers: Sit to/from Stand Sit to Stand: Min assist           General transfer comment: minA to boost into standing from lower surface      Balance Overall balance assessment: Needs assistance Sitting-balance support: No upper extremity supported, Feet supported Sitting balance-Leahy Scale: Good Sitting balance - Comments: Able to sit EOB and donn spinal brace without UE support.   Standing balance support: Bilateral upper extremity supported, During functional activity, Reliant on assistive device for balance Standing balance-Leahy Scale: Poor Standing balance comment: Reliant on RW support in static stance and functional mobility.                           ADL either performed or assessed with clinical judgement   ADL Overall ADL's : Needs assistance/impaired Eating/Feeding: Independent;Sitting   Grooming: Min guard;Standing   Upper Body Bathing: Set up;Sitting   Lower Body Bathing: Minimal assistance;Sit to/from stand   Upper Body Dressing : Min guard Upper Body Dressing Details (indicate cue type and reason): minguard for donning back brace Lower Body Dressing: Minimal assistance;Sit to/from stand Lower Body Dressing Details (indicate cue type and reason): pt uses AE at baseline, provided assistance due to no equipment in room Toilet Transfer: Minimal assistance;Ambulation;Regular Teacher, adult education Details (indicate cue type and reason): assist to powerup into  standing  Functional mobility during ADLs: Minimal assistance;Rolling walker (2 wheels) General ADL Comments: pt limited by pain, decreased activity tolerance     Vision         Perception     Praxis      Pertinent Vitals/Pain Pain Assessment Pain Assessment: 0-10 Pain Score: 5  Pain Location: Incision Site, Bilateral Calves Pain Descriptors / Indicators: Operative site guarding, Discomfort Pain Intervention(s): Limited activity within patient's tolerance, Monitored during session, Patient requesting pain meds-RN notified     Hand Dominance Right   Extremity/Trunk Assessment Upper Extremity Assessment Upper Extremity Assessment: Overall WFL for tasks assessed   Lower Extremity Assessment Lower Extremity Assessment: Defer to PT evaluation RLE Deficits / Details: RLE presents with muscular atrophy at calves.   Cervical / Trunk Assessment Cervical / Trunk Assessment: Normal   Communication Communication Communication: No difficulties   Cognition Arousal/Alertness: Awake/alert Behavior During Therapy: WFL for tasks assessed/performed Overall Cognitive Status: Within Functional Limits for tasks assessed                                 General Comments: A&Ox4; Pleasant throughout entire session.     General Comments  pt voiced feelings of depression and decreased appetite, notified RN    Exercises     Shoulder Instructions      Home Living Family/patient expects to be discharged to:: Skilled nursing facility (Knows plan is for possible SNF/CIR placement before home) Living Arrangements: Alone (has lived with son since March) Available Help at Discharge: Family;Available PRN/intermittently Type of Home: House Home Access: Stairs to enter [employer] of Steps: 2 Entrance Stairs-Rails: Right Home Layout: One level     Bathroom Shower/Tub: Producer, television/film/video: Standard Bathroom Accessibility: No   Home  Equipment: Agricultural consultant (2 wheels);Shower seat;Wheelchair - manual   Additional Comments: Following discharge, will live at [facility] house for a week.      Prior Functioning/Environment Prior Level of Function : Independent/Modified Independent;Driving             Mobility Comments: Independent with intermittent use of walking stick or RW due to pain. ADLs Comments: independent        OT Problem List: Decreased strength;Decreased activity tolerance;Impaired balance (sitting and/or standing);Decreased safety awareness;Decreased knowledge of precautions;Pain      OT Treatment/Interventions: Self-care/ADL training;Therapeutic exercise;DME and/or AE instruction;Therapeutic activities;Patient/family education;Balance training    OT Goals(Current goals can be found in the care plan section) Acute Rehab OT Goals Patient Stated Goal: "for all of this to be over" OT Goal Formulation: With patient Time For Goal Achievement: 10/19/22 Potential to Achieve Goals: Good ADL Goals Pt Will Perform Grooming: (P) with modified independence;standing Pt Will Perform Lower Body Dressing: (P) with modified independence;sit to/from stand Pt Will Transfer to Toilet: (P) with modified independence;ambulating Additional ADL Goal #1: (P) Pt will demonstrate independence with back precautions during ADL/IADL and functional mobility.  OT Frequency: Min 2X/week    Co-evaluation              AM-PAC OT "6 Clicks" Daily Activity     Outcome Measure Help from another person eating meals?: A Little Help from another person taking care of personal grooming?: A Little Help from another person toileting, which includes using toliet, bedpan, or urinal?: A Little Help from another person bathing (including washing, rinsing, drying)?: A Little Help from another person to put on and taking off regular upper body  clothing?: A Little Help from another person to put on and taking off regular lower body clothing?:  A Little 6 Click Score: 18   End of Session Equipment Utilized During Treatment: Rolling walker (2 wheels);Gait belt;Back brace Nurse Communication: Mobility status  Activity Tolerance: Patient tolerated treatment well;Patient limited by pain Patient left: in bed;with call bell/phone within reach;with bed alarm set  OT Visit Diagnosis: Other abnormalities of gait and mobility (R26.89);Muscle weakness (generalized) (M62.81);Pain Pain - part of body:  (back)                Time: 1610-9604 OT Time Calculation (min): 26 min Charges:  OT General Charges $OT Visit: 1 Visit OT Evaluation $OT Eval Low Complexity: 1 Low OT Treatments $Self Care/Home Management : 8-22 mins  Rosey Bath OTR/L Acute Rehabilitation Services Office: [phone]   Rebeca Alert 10/05/2022, 12:09 PM

## 2022-10-06 MED ORDER — GERHARDT'S BUTT CREAM
TOPICAL_CREAM | Freq: Two times a day (BID) | CUTANEOUS | Status: DC
  Filled 2022-10-06 (×2): qty 1

## 2022-10-06 MED ORDER — HEPARIN SODIUM (PORCINE) 5000 UNIT/ML IJ SOLN
5000.0000 [IU] | Freq: Three times a day (TID) | INTRAMUSCULAR | Status: DC
  Administered 2022-10-06 – 2022-10-07 (×4): 5000 [IU] via SUBCUTANEOUS
  Filled 2022-10-06 (×4): qty 1

## 2022-10-06 NOTE — PMR Pre-admission (Signed)
PMR Admission Coordinator Pre-Admission Assessment  Patient: Dwayne Nelson is an 67 y.o., male MRN: [id number] DOB: 03-02-1956 Height: 5\' 10"  (177.8 cm) Weight: 108.3 kg  Insurance Information HMO:     PPO:      PCP:      IPA:      80/20: yes     OTHER:  PRIMARY: Medicare A & B      Policy#: [id number]      Subscriber: patient CM Name:       Phone#:      Fax#:  Pre-Cert#:       Employer:  Benefits:  Phone #: verified eligibility via [employer] on 10/06/22     Name:  Eff. Date: Part A & B effective 02/23/19     Deduct: $1,632      Out of Pocket Max: NA      Life Max: NA CIR: 100% coverage      SNF: 100% coverage for days 1-20, 80% coverage for days 21-100 Outpatient: 80% coverage     Co-Pay: 20% Home Health: 100% coverage      Co-Pay:  DME: 80% coverage     Co-Pay: 20% Providers: pt's choice SECONDARY: [employer]      Policy#: [id number]     Phone#: [phone]  Financial Counselor:       Phone#:   The "Data Collection Information Summary" for patients in Inpatient Rehabilitation Facilities with attached "Privacy Act Statement-Health Care Records" was provided and verbally reviewed with: Patient  Emergency Contact Information Contact Information     Name Relation Home Work Mobile   Nelson. Son   [phone]      Other Contacts   None on File     Current Medical History  Patient Admitting Diagnosis: spondylolisthesis of lumbar region, s/p L3-5 laminectomy with posterior fusion at L4-5 History of Present Illness: Pt is a 67 year old male with  medical hx significant for: diabetes, HTN, skin CA, hyperlipidemia, recent bilateral L3-4 and L4-5 laminotomy/foraminotomies/medial facetectomy to decompress the bilateral L3, L4 and L5 nerve roots (09/30/22).  Pt presented to [facility] on 10/04/22 d/t increased low back pain. MRI lumbar spine shows postsurgical changes from interval L4-L% posterior spinal fusion and decompression. Small fluid collection abutting  right transpedicular screw at L4 vertebral body which causes mass effect on thecal sac which is severely narrowed and increased from prior examinations. Neurosurgery consulted. Determined MRI showed expected postoperative changes. Therapy evaluations completed and CIR recommended d/t pt's deficits in functional mobility.    Patient's medical record from [facility] has been reviewed by the rehabilitation admission coordinator and physician.  Past Medical History  Past Medical History:  Diagnosis Date   Cancer (HCC)    LPFA skin cancer Dr. Terri Piedra   Diabetes mellitus without complication (HCC)    Hyperlipidemia    Hypertension     Has the patient had major surgery during 100 days prior to admission? Yes  Family History   family history includes Bladder Cancer in his father.  Current Medications  Current Facility-Administered Medications:    acetaminophen (TYLENOL) tablet 650 mg, 650 mg, Oral, Q6H PRN, 650 mg at 10/06/22 0634 **OR** acetaminophen (TYLENOL) suppository 650 mg, 650 mg, Rectal, Q6H PRN, Bergman, Meghan D, NP   amLODipine (NORVASC) tablet 5 mg, 5 mg, Oral, Daily, 5 mg at 10/07/22 1051 **AND** benazepril (LOTENSIN) tablet 20 mg, 20 mg, Oral, Daily, Bergman, Meghan D, NP, 20 mg at 10/07/22 1051   aspirin chewable tablet  81 mg, 81 mg, Oral, Daily, Bergman, Meghan D, NP, 81 mg at 10/07/22 1051   atorvastatin (LIPITOR) tablet 20 mg, 20 mg, Oral, Daily, Bergman, Meghan D, NP, 20 mg at 10/07/22 1051   bisacodyl (DULCOLAX) suppository 10 mg, 10 mg, Rectal, Daily PRN, Doran Durand, Meghan D, NP   docusate sodium (COLACE) capsule 100 mg, 100 mg, Oral, BID, Bergman, Meghan D, NP, 100 mg at 10/07/22 1051   empagliflozin (JARDIANCE) tablet 25 mg, 25 mg, Oral, Daily, Bergman, Meghan D, NP, 25 mg at 10/07/22 1052   feeding supplement (ENSURE ENLIVE / ENSURE PLUS) liquid 237 mL, 237 mL, Oral, BID BM, Tressie Stalker, MD, 237 mL at 10/05/22 1741   gabapentin (NEURONTIN) capsule 300 mg,  300 mg, Oral, TID, Bergman, Meghan D, NP, 300 mg at 10/07/22 1051   Gerhardt's butt cream, , Topical, BID, Iran Sizer, PA-C, Given at 10/07/22 1105   heparin injection 5,000 Units, 5,000 Units, Subcutaneous, Q8H, Cosentino, Talmadge Chad, PA-C, 5,000 Units at 10/07/22 0535   hydrochlorothiazide (HYDRODIURIL) tablet 25 mg, 25 mg, Oral, Daily, Bergman, Meghan D, NP, 25 mg at 10/07/22 1051   HYDROmorphone (DILAUDID) injection 0.5-1 mg, 0.5-1 mg, Intravenous, Q2H PRN, Bergman, Meghan D, NP   insulin glargine-yfgn (SEMGLEE) injection 30 Units, 30 Units, Subcutaneous, BID, Leander Rams, RPH, 30 Units at 10/07/22 1052   loratadine (CLARITIN) tablet 10 mg, 10 mg, Oral, Daily, Bergman, Meghan D, NP, 10 mg at 10/07/22 1051   metFORMIN (GLUCOPHAGE-XR) 24 hr tablet 1,000 mg, 1,000 mg, Oral, BID WC, Bergman, Meghan D, NP, 1,000 mg at 10/07/22 0536   metoprolol succinate (TOPROL-XL) 24 hr tablet 50 mg, 50 mg, Oral, QHS, Bergman, Meghan D, NP, 50 mg at 10/06/22 2251   metoprolol tartrate (LOPRESSOR) injection 5 mg, 5 mg, Intravenous, Q6H PRN, Bergman, Meghan D, NP   ondansetron (ZOFRAN) tablet 4 mg, 4 mg, Oral, Q6H PRN **OR** ondansetron (ZOFRAN) injection 4 mg, 4 mg, Intravenous, Q6H PRN, Bergman, Meghan D, NP   oxyCODONE-acetaminophen (PERCOCET/ROXICET) 5-325 MG per tablet 1 tablet, 1 tablet, Oral, Q4H PRN, Doran Durand, Meghan D, NP, 1 tablet at 10/07/22 1051   pioglitazone (ACTOS) tablet 30 mg, 30 mg, Oral, QHS, Bergman, Meghan D, NP, 30 mg at 10/06/22 2251   polyethylene glycol (MIRALAX / GLYCOLAX) packet 17 g, 17 g, Oral, Daily PRN, Bergman, Meghan D, NP   sodium phosphate (FLEET) 7-19 GM/118ML enema 1 enema, 1 enema, Rectal, Once PRN, Bergman, Meghan D, NP   testosterone (ANDROGEL) 50 MG/5GM (1%) gel 5 g, 5 g, Transdermal, Daily, Bergman, Meghan D, NP, 5 g at 10/06/22 2249   zolpidem (AMBIEN) tablet 5 mg, 5 mg, Oral, QHS PRN, Val Eagle D, NP  Patients Current Diet:  Diet Order             Diet  Carb Modified Fluid consistency: Thin; Room service appropriate? Yes  Diet effective now                   Precautions / Restrictions Precautions Precautions: Fall, Back Precaution Booklet Issued: Yes (comment) Precaution Comments: reviewed spinal precautions during functional mobility Spinal Brace: Lumbar corset, Applied in sitting position Restrictions Weight Bearing Restrictions: No   Has the patient had 2 or more falls or a fall with injury in the past year? Yes  Prior Activity Level Community (5-7x/wk): drive, gets out of house frequently  Prior Functional Level Self Care: Did the patient need help bathing, dressing, using the toilet or eating? Independent  Indoor Mobility:  Did the patient need assistance with walking from room to room (with or without device)? Independent  Stairs: Did the patient need assistance with internal or external stairs (with or without device)? Independent  Functional Cognition: Did the patient need help planning regular tasks such as shopping or remembering to take medications? Independent  Patient Information Are you of Hispanic, Latino/a,or Spanish origin?: A. No, not of Hispanic, Latino/a, or Spanish origin What is your race?: A. White Do you need or want an interpreter to communicate with a doctor or health care staff?: 0. No  Patient's Response To:  Health Literacy and Transportation Is the patient able to respond to health literacy and transportation needs?: Yes Health Literacy - How often do you need to have someone help you when you read instructions, pamphlets, or other written material from your doctor or pharmacy?: Never In the past 12 months, has lack of transportation kept you from medical appointments or from getting medications?: No In the past 12 months, has lack of transportation kept you from meetings, work, or from getting things needed for daily living?: No  Home Assistive Devices / Equipment Home Assistive  Devices/Equipment: Contact lenses, Eyeglasses, Raised toilet seat with rails, Environmental consultant (specify type) (front wheel walker) Home Equipment: Agricultural consultant (2 wheels), Shower seat, Wheelchair - manual  Prior Device Use: Indicate devices/aids used by the patient prior to current illness, exacerbation or injury? None of the above  Current Functional Level Cognition  Overall Cognitive Status: Within Functional Limits for tasks assessed Orientation Level: Oriented X4 General Comments: A&Ox4; Pleasant throughout entire session.-able to describe confusion from last night in detail and aware that he was confused but is not confused today    Extremity Assessment (includes Sensation/Coordination)  Upper Extremity Assessment: Defer to OT evaluation  Lower Extremity Assessment: Generalized weakness RLE Deficits / Details: RLE presents with muscular atrophy at calves.    ADLs  Overall ADL's : Needs assistance/impaired Eating/Feeding: Independent, Sitting Grooming: Min guard, Standing Upper Body Bathing: Set up, Sitting Lower Body Bathing: Minimal assistance, Sit to/from stand Upper Body Dressing : Set up, Sitting Upper Body Dressing Details (indicate cue type and reason): able to don brace while adhering to back precautions without any assistance Lower Body Dressing: Sitting/lateral leans, Min guard Lower Body Dressing Details (indicate cue type and reason): pt uses AE at baseline, has sneakers that are slip on with no tie laces, able to slide L foot into it but needed to cross R leg over knee to get shoe over heel Toilet Transfer: Minimal assistance, Ambulation, Regular Toilet, BSC/3in1, Rolling walker (2 wheels), Cueing for sequencing, Cueing for safety Toilet Transfer Details (indicate cue type and reason): 3n1 over commode in b.room, cues for hand placement on arm rests during sit/stands stand/sit Functional mobility during ADLs: Minimal assistance, Rolling walker (2 wheels) General ADL Comments:  has a/e and dme at home, receptive to cues for rw management and hand placement aware he has to work on correction of compensatory habits from prior to sx.    Mobility  Overal bed mobility: Needs Assistance Bed Mobility: Rolling, Sidelying to Sit Rolling: Min assist Sidelying to sit: Min assist Sit to sidelying: Supervision General bed mobility comments: requires momentum to get rolled onto side with heavy use of bed rail and min A to get trunk elevation.    Transfers  Overall transfer level: Needs assistance Equipment used: Rolling walker (2 wheels) Transfers: Sit to/from Stand, Bed to chair/wheelchair/BSC Sit to Stand: Min assist Bed to/from chair/wheelchair/BSC transfer type::  Step pivot General transfer comment: Cues for hand placement. Min A    Ambulation / Gait / Stairs / Psychologist, prison and probation services  Ambulation/Gait Ambulation/Gait assistance: Land (Feet): 30 Feet Assistive device: Rolling walker (2 wheels) Gait Pattern/deviations: Step-through pattern, Decreased stride length, Shuffle, Trunk flexed General Gait Details: Cues for safety, improving foot clearance B. Pain limited, unable to walk back to room, required ride in recliner. Having severe spasms, stabbing pain once seated in chair in hallway. Gait velocity: decr    Posture / Balance Dynamic Sitting Balance Sitting balance - Comments: Able to sit EOB and donn spinal brace without UE support. Balance Overall balance assessment: Needs assistance Sitting-balance support: Feet supported Sitting balance-Leahy Scale: Good Sitting balance - Comments: Able to sit EOB and donn spinal brace without UE support. Standing balance support: Bilateral upper extremity supported, During functional activity Standing balance-Leahy Scale: Poor Standing balance comment: Reliant on RW support in static stance and functional mobility.    Special needs/care consideration Skin Abrasion: arm,knee/ right, left; Erythema/Redness:  penis, scrotum/bilateral; Surgical incision: vertebral column/lower; Wound- penis and Diabetic management Semglee 30 units 2x daily   Previous Home Environment (from acute therapy documentation) Living Arrangements: Alone (lived with son since March)  Lives With: Son, Other (Comment) (daughter-in-law) Available Help at Discharge: Family, Available PRN/intermittently Type of Home: House Home Layout: One level Home Access: Stairs to enter Entrance Stairs-Rails: Right Entrance Stairs-Number of Steps: 2 Bathroom Shower/Tub: Psychologist, counselling, Engineer, manufacturing systems: Handicapped height Bathroom Accessibility: Yes How Accessible: Accessible via walker Home Care Services: No Additional Comments: Following discharge, will live at [facility] house for a week.  Discharge Living Setting Plans for Discharge Living Setting: House (son's house) Type of Home at Discharge: House Discharge Home Layout: One level Discharge Home Access: Stairs to enter Entrance Stairs-Rails: None Entrance Stairs-Number of Steps: 2 Discharge Bathroom Shower/Tub: Tub/shower unit Discharge Bathroom Toilet: Handicapped height Discharge Bathroom Accessibility: Yes How Accessible: Accessible via walker Does the patient have any problems obtaining your medications?: No  Social/Family/Support Systems Anticipated Caregiver: Byrant Valent (son) and daughter-in-law Anticipated Caregiver's Contact Information: [place]: [phone] Caregiver Availability: Intermittent Discharge Plan Discussed with Primary Caregiver: Yes Is Caregiver In Agreement with Plan?: Yes Does Caregiver/Family have Issues with Lodging/Transportation while Pt is in Rehab?: No  Goals Patient/Family Goal for Rehab: Mod I: PT/OT Expected length of stay: 7-10 days Pt/Family Agrees to Admission and willing to participate: Yes Program Orientation Provided & Reviewed with Pt/Caregiver Including Roles  & Responsibilities: Yes  Decrease burden of Care  through IP rehab admission: NA  Possible need for SNF placement upon discharge: Not anticipated  Patient Condition: I have reviewed medical records from [facility], spoken with CM, and patient and son. I met with patient at the bedside and discussed via phone for inpatient rehabilitation assessment.  Patient will benefit from ongoing PT and OT, can actively participate in 3 hours of therapy a day 5 days of the week, and can make measurable gains during the admission.  Patient will also benefit from the coordinated team approach during an Inpatient Acute Rehabilitation admission.  The patient will receive intensive therapy as well as Rehabilitation physician, nursing, social worker, and care management interventions.  Due to safety, skin/wound care, disease management, medication administration, pain management, and patient education the patient requires 24 hour a day rehabilitation nursing.  The patient is currently Min G-Min A with mobility and Min G with basic ADLs.  Discharge setting and therapy post discharge at home  with home health is anticipated.  Patient has agreed to participate in the Acute Inpatient Rehabilitation Program and will admit today.  Preadmission Screen Completed By:  Domingo Pulse, 10/07/2022 11:28 AM ______________________________________________________________________   Discussed status with Dr. Wynn Banker on 10/07/22  at 11:28 AM and received approval for admission today.  Admission Coordinator:  Domingo Pulse, CCC-SLP, time 11:28 AM/Date 10/07/22    Assessment/Plan: Diagnosis:  Lumbar spinal stenosis Does the need for close, 24 hr/day Medical supervision in concert with the patient's rehab needs make it unreasonable for this patient to be served in a less intensive setting? Yes Co-Morbidities requiring supervision/potential complications: HTN. DM Type 2  Due to bladder management, bowel management, safety, skin/wound care, disease management,  medication administration, pain management, and patient education, does the patient require 24 hr/day rehab nursing? Yes Does the patient require coordinated care of a physician, rehab nurse, PT, OT, and SLP to address physical and functional deficits in the context of the above medical diagnosis(es)? Yes Addressing deficits in the following areas: balance, endurance, locomotion, strength, transferring, bowel/bladder control, bathing, dressing, feeding, grooming, and psychosocial support Can the patient actively participate in an intensive therapy program of at least 3 hrs of therapy 5 days a week? Yes The potential for patient to make measurable gains while on inpatient rehab is good Anticipated functional outcomes upon discharge from inpatient rehab: modified independent PT, modified independent OT, n/a SLP Estimated rehab length of stay to reach the above functional goals is: 7-10d Anticipated discharge destination: Home 10. Overall Rehab/Functional Prognosis: good   MD Signature: Erick Colace M.D. [facility] Fellow Am Acad of Phys Med and Rehab Diplomate Am Board of Electrodiagnostic Med Fellow Am Board of Interventional Pain

## 2022-10-06 NOTE — Progress Notes (Signed)
Inpatient Rehab Admissions:  Inpatient Rehab Consult received.  I met with patient at the bedside for rehabilitation assessment and to discuss goals and expectations of an inpatient rehab admission.  Discussed average length of stay and discharge home after completion of CIR. Pt acknowledged understanding. Pt interested in pursuing CIR. Pt gave permission to contact son Dwayne Nelson. Spoke with Dwayne Nelson on the telephone. He also acknowledged understanding of CIR goals and expectations. He is supportive of pt pursuing CIR. He confirmed that he and family would be able to provide intermittent support for pt after discharge. Will continue to follow.  Signed: Wolfgang Phoenix, MS, CCC-SLP Admissions Coordinator [phone]

## 2022-10-06 NOTE — Plan of Care (Signed)

## 2022-10-06 NOTE — Progress Notes (Addendum)
Patient informed nurse that he saw children walking/running around in his room, he was hearing voices of his son and grandchildren. Patient was calm and pleasant, easily reoriented and oriented x4 upon assessment.  Emilee Hero, PA was notified via secure chat.    Courtney Bellizzi

## 2022-10-06 NOTE — Progress Notes (Signed)
Per Emilee Hero, PA hold "all narcotic medications".   Adlene Adduci

## 2022-10-06 NOTE — Progress Notes (Signed)
Occupational Therapy Treatment Patient Details Name: Dwayne Nelson MRN: [id number] DOB: 08/19/1955 Today's Date: 10/06/2022   History of present illness 67 y.o. male s/p Bilateral L3-4 and L4-5 laminotomy/foraminotomies/medial facetectomy (09/30/2022). PMH significant for: CA, DM, HLD, and HTN.   OT comments  Pt. Seen for skilled OT treatment session.  Able to don brace with set up.  Ambulation in room with seated rest break required at half way point.  Followed with chair during ambulation for pt. Safety and availability for needed rest break option.  Cues for rw management and hand placement during transfers. Pt. Receptive to education and had noted improvement by end of session with sit/stands using appropriate hand placement tech.  Reports he has a/e and dme from previous sx. And good family support.  Encouraged oob for meals to increasing activity tolerance and mobility.  Agree with current d/c recommendations if pt. Continues to progress with safe mobility and adl completion.      Recommendations for follow up therapy are one component of a multi-disciplinary discharge planning process, led by the attending physician.  Recommendations may be updated based on patient status, additional functional criteria and insurance authorization.    Assistance Recommended at Discharge Intermittent Supervision/Assistance  Patient can return home with the following  A little help with walking and/or transfers;A little help with bathing/dressing/bathroom;Assistance with cooking/housework   Equipment Recommendations  BSC/3in1    Recommendations for Other Services      Precautions / Restrictions Precautions Precautions: Fall;Back Required Braces or Orthoses: Spinal Brace Spinal Brace: Lumbar corset;Applied in sitting position       Mobility Bed Mobility Overal bed mobility: Needs Assistance Bed Mobility: Sidelying to Sit   Sidelying to sit: Min assist       General bed mobility comments:  assist for trunk elevation    Transfers Overall transfer level: Needs assistance Equipment used: Rolling walker (2 wheels) Transfers: Sit to/from Stand, Bed to chair/wheelchair/BSC Sit to Stand: Mod assist           General transfer comment: assist for rise and steady, especially from low surface of eob. stand x2, from EOB and toilet. cues for hand placement     Balance                                           ADL either performed or assessed with clinical judgement   ADL Overall ADL's : Needs assistance/impaired                 Upper Body Dressing : Set up;Sitting Upper Body Dressing Details (indicate cue type and reason): able to don brace while adhering to back precautions without any assistance Lower Body Dressing: Sitting/lateral leans;Min guard Lower Body Dressing Details (indicate cue type and reason): pt uses AE at baseline, has sneakers that are slip on with no tie laces, able to slide L foot into it but needed to cross R leg over knee to get shoe over heel Toilet Transfer: Minimal assistance;Ambulation;Regular Toilet;BSC/3in1;Rolling walker (2 wheels);Cueing for sequencing;Cueing for safety Toilet Transfer Details (indicate cue type and reason): 3n1 over commode in b.room, cues for hand placement on arm rests during sit/stands stand/sit         Functional mobility during ADLs: Minimal assistance;Rolling walker (2 wheels) General ADL Comments: has a/e and dme at home, receptive to cues for rw management and hand placement aware he has to  work on correction of compensatory habits from prior to sx.    Extremity/Trunk Assessment              Vision       Perception     Praxis      Cognition Arousal/Alertness: Awake/alert Behavior During Therapy: WFL for tasks assessed/performed Overall Cognitive Status: Within Functional Limits for tasks assessed                                 General Comments: A&Ox4; Pleasant  throughout entire session.-able to describe confusion from last night in detail and aware that he was confused but is not confused today        Exercises      Shoulder Instructions       General Comments      Pertinent Vitals/ Pain       Pain Assessment Pain Assessment: 0-10 Pain Score: 2  Pain Location: back, legs Pain Descriptors / Indicators: Operative site guarding, Discomfort, Sore Pain Intervention(s): Limited activity within patient's tolerance, Repositioned  Home Living                                          Prior Functioning/Environment              Frequency  Min 2X/week        Progress Toward Goals  OT Goals(current goals can now be found in the care plan section)  Progress towards OT goals: Progressing toward goals     Plan Discharge plan remains appropriate    Co-evaluation                 AM-PAC OT "6 Clicks" Daily Activity     Outcome Measure   Help from another person eating meals?: A Little Help from another person taking care of personal grooming?: A Little Help from another person toileting, which includes using toliet, bedpan, or urinal?: A Little Help from another person bathing (including washing, rinsing, drying)?: A Little Help from another person to put on and taking off regular upper body clothing?: A Little Help from another person to put on and taking off regular lower body clothing?: A Little 6 Click Score: 18    End of Session Equipment Utilized During Treatment: Rolling walker (2 wheels);Gait belt;Back brace  OT Visit Diagnosis: Other abnormalities of gait and mobility (R26.89);Muscle weakness (generalized) (M62.81);Pain   Activity Tolerance Patient tolerated treatment well   Patient Left in chair;with call bell/phone within reach   Nurse Communication Other (comment) (reviewed with rn tx session details, pt. in chair rn states no chair alarm needed)        Time: 0981-1914 OT Time  Calculation (min): 30 min  Charges: OT General Charges $OT Visit: 1 Visit OT Treatments $Self Care/Home Management : 23-37 mins  Boneta Lucks, COTA/L Acute Rehabilitation [phone]   Alessandra Bevels Lorraine-COTA/L 10/06/2022, 8:52 AM

## 2022-10-06 NOTE — Progress Notes (Addendum)
Neurosurgery Service Progress Note  Subjective: Reports hallucinations overnight. Was tolerating narcotics at home and while at the hospital. Nurse reports that it seems  the muscle relaxant caused the hallucinating. Overall, he continues to feel better.   Objective: Vitals:   10/05/22 2017 10/06/22 0524 10/06/22 0620 10/06/22 0812  BP: (!) 108/58 118/75  106/77  Pulse: 72 65  73  Resp: 16 16  18   Temp: 98.9 F (37.2 C) 98.1 F (36.7 C)  98.1 F (36.7 C)  TempSrc: Oral Oral    SpO2: 93% 96%  94%  Weight:   108.3 kg   Height:        Physical Exam: AAox3, Strength 5/5 x4 and SILTx4, incision c/d/I   Assessment & Plan: 67 y.o. male s/p L3-5 laminectomy with L4-5 PSF on 09/30/22, improving with PT.   -PT/OT   -will d/c flexeril, if hallucinations return will hold narcotics   -SCDs/TEDs/SQH -PT/OT recommend CIR - pending approval  Emilee Hero, PA-C 10/06/22 10:04 AM

## 2022-10-07 ENCOUNTER — Inpatient Hospital Stay (HOSPITAL_COMMUNITY)
Admission: RE | Admit: 2022-10-07 | Discharge: 2022-10-14 | DRG: 560 | Disposition: A | Discharge status: Home / Self Care | Payer: Medicare Other | Source: Intra-hospital | Attending: Physical Medicine and Rehabilitation | Admitting: Physical Medicine and Rehabilitation

## 2022-10-07 ENCOUNTER — Other Ambulatory Visit: Payer: Self-pay

## 2022-10-07 ENCOUNTER — Encounter (HOSPITAL_COMMUNITY): Payer: Self-pay | Admitting: Physical Medicine and Rehabilitation

## 2022-10-07 DIAGNOSIS — L7632 Postprocedural hematoma of skin and subcutaneous tissue following other procedure: Secondary | ICD-10-CM | POA: Diagnosis present

## 2022-10-07 DIAGNOSIS — Z79899 Other long term (current) drug therapy: Secondary | ICD-10-CM | POA: Diagnosis not present

## 2022-10-07 DIAGNOSIS — M62838 Other muscle spasm: Secondary | ICD-10-CM | POA: Diagnosis present

## 2022-10-07 DIAGNOSIS — E11649 Type 2 diabetes mellitus with hypoglycemia without coma: Secondary | ICD-10-CM | POA: Diagnosis not present

## 2022-10-07 DIAGNOSIS — M7918 Myalgia, other site: Secondary | ICD-10-CM | POA: Diagnosis not present

## 2022-10-07 DIAGNOSIS — M5416 Radiculopathy, lumbar region: Secondary | ICD-10-CM

## 2022-10-07 DIAGNOSIS — Z4789 Encounter for other orthopedic aftercare: Secondary | ICD-10-CM | POA: Diagnosis not present

## 2022-10-07 DIAGNOSIS — Z7984 Long term (current) use of oral hypoglycemic drugs: Secondary | ICD-10-CM | POA: Diagnosis not present

## 2022-10-07 DIAGNOSIS — E785 Hyperlipidemia, unspecified: Secondary | ICD-10-CM | POA: Diagnosis present

## 2022-10-07 DIAGNOSIS — Z7982 Long term (current) use of aspirin: Secondary | ICD-10-CM

## 2022-10-07 DIAGNOSIS — M4326 Fusion of spine, lumbar region: Secondary | ICD-10-CM | POA: Diagnosis not present

## 2022-10-07 DIAGNOSIS — Z981 Arthrodesis status: Secondary | ICD-10-CM | POA: Diagnosis not present

## 2022-10-07 DIAGNOSIS — I10 Essential (primary) hypertension: Secondary | ICD-10-CM | POA: Diagnosis present

## 2022-10-07 DIAGNOSIS — Z85828 Personal history of other malignant neoplasm of skin: Secondary | ICD-10-CM | POA: Diagnosis not present

## 2022-10-07 DIAGNOSIS — M48061 Spinal stenosis, lumbar region without neurogenic claudication: Secondary | ICD-10-CM | POA: Diagnosis present

## 2022-10-07 DIAGNOSIS — Y838 Other surgical procedures as the cause of abnormal reaction of the patient, or of later complication, without mention of misadventure at the time of the procedure: Secondary | ICD-10-CM | POA: Diagnosis present

## 2022-10-07 DIAGNOSIS — R7989 Other specified abnormal findings of blood chemistry: Secondary | ICD-10-CM | POA: Diagnosis present

## 2022-10-07 DIAGNOSIS — R159 Full incontinence of feces: Secondary | ICD-10-CM | POA: Diagnosis present

## 2022-10-07 DIAGNOSIS — M4316 Spondylolisthesis, lumbar region: Secondary | ICD-10-CM | POA: Diagnosis present

## 2022-10-07 LAB — GLUCOSE, CAPILLARY: Glucose-Capillary: 142 mg/dL — ABNORMAL HIGH (ref 70–99)

## 2022-10-07 MED ORDER — ENSURE ENLIVE PO LIQD: 237.0000 mL | Freq: Two times a day (BID) | ORAL | Status: DC

## 2022-10-07 MED ORDER — FLEET ENEMA 7-19 GM/118ML RE ENEM: 1.0000 | ENEMA | Freq: Once | RECTAL | Status: DC | PRN

## 2022-10-07 MED ORDER — EMPAGLIFLOZIN 25 MG PO TABS
25.0000 mg | ORAL_TABLET | Freq: Every day | ORAL | Status: DC
  Administered 2022-10-08 – 2022-10-14 (×6): 25 mg via ORAL
  Filled 2022-10-07 (×7): qty 1

## 2022-10-07 MED ORDER — PROCHLORPERAZINE MALEATE 5 MG PO TABS: 5.0000 mg | ORAL_TABLET | Freq: Four times a day (QID) | ORAL | Status: DC | PRN

## 2022-10-07 MED ORDER — LORATADINE 10 MG PO TABS
10.0000 mg | ORAL_TABLET | Freq: Every day | ORAL | Status: DC
  Administered 2022-10-08 – 2022-10-14 (×7): 10 mg via ORAL
  Filled 2022-10-07 (×7): qty 1

## 2022-10-07 MED ORDER — HYDROCHLOROTHIAZIDE 25 MG PO TABS
25.0000 mg | ORAL_TABLET | Freq: Every day | ORAL | Status: DC
  Administered 2022-10-08 – 2022-10-14 (×7): 25 mg via ORAL
  Filled 2022-10-07 (×7): qty 1

## 2022-10-07 MED ORDER — METFORMIN HCL ER 500 MG PO TB24
1000.0000 mg | ORAL_TABLET | Freq: Two times a day (BID) | ORAL | Status: DC
  Administered 2022-10-07 – 2022-10-14 (×14): 1000 mg via ORAL
  Filled 2022-10-07 (×14): qty 2

## 2022-10-07 MED ORDER — HEPARIN SODIUM (PORCINE) 5000 UNIT/ML IJ SOLN
5000.0000 [IU] | Freq: Three times a day (TID) | INTRAMUSCULAR | Status: DC
  Administered 2022-10-07 – 2022-10-14 (×20): 5000 [IU] via SUBCUTANEOUS
  Filled 2022-10-07 (×20): qty 1

## 2022-10-07 MED ORDER — DOCUSATE SODIUM 100 MG PO CAPS
100.0000 mg | ORAL_CAPSULE | Freq: Two times a day (BID) | ORAL | Status: DC
  Administered 2022-10-09 – 2022-10-14 (×8): 100 mg via ORAL
  Filled 2022-10-07 (×14): qty 1

## 2022-10-07 MED ORDER — TESTOSTERONE 50 MG/5GM (1%) TD GEL
5.0000 g | Freq: Every day | TRANSDERMAL | Status: DC
  Administered 2022-10-07 – 2022-10-13 (×7): 5 g via TRANSDERMAL
  Filled 2022-10-07 (×7): qty 5

## 2022-10-07 MED ORDER — ASPIRIN 81 MG PO CHEW
81.0000 mg | CHEWABLE_TABLET | Freq: Every day | ORAL | Status: DC
  Administered 2022-10-08 – 2022-10-14 (×7): 81 mg via ORAL
  Filled 2022-10-07 (×7): qty 1

## 2022-10-07 MED ORDER — HEPARIN SODIUM (PORCINE) 5000 UNIT/ML IJ SOLN: 5000.0000 [IU] | Freq: Three times a day (TID) | INTRAMUSCULAR | Status: DC

## 2022-10-07 MED ORDER — GUAIFENESIN-DM 100-10 MG/5ML PO SYRP: 10.0000 mL | ORAL_SOLUTION | Freq: Four times a day (QID) | ORAL | Status: DC | PRN

## 2022-10-07 MED ORDER — ALUM & MAG HYDROXIDE-SIMETH 200-200-20 MG/5ML PO SUSP: 30.0000 mL | ORAL | Status: DC | PRN

## 2022-10-07 MED ORDER — PIOGLITAZONE HCL 30 MG PO TABS
30.0000 mg | ORAL_TABLET | Freq: Every day | ORAL | Status: DC
  Administered 2022-10-07 – 2022-10-13 (×7): 30 mg via ORAL
  Filled 2022-10-07 (×8): qty 1

## 2022-10-07 MED ORDER — TRAZODONE HCL 50 MG PO TABS: 50.0000 mg | ORAL_TABLET | Freq: Every evening | ORAL | Status: DC | PRN

## 2022-10-07 MED ORDER — METOPROLOL SUCCINATE ER 50 MG PO TB24
50.0000 mg | ORAL_TABLET | Freq: Every day | ORAL | Status: DC
  Administered 2022-10-07 – 2022-10-13 (×7): 50 mg via ORAL
  Filled 2022-10-07 (×7): qty 1

## 2022-10-07 MED ORDER — INSULIN GLARGINE-YFGN 100 UNIT/ML ~~LOC~~ SOLN
30.0000 [IU] | Freq: Two times a day (BID) | SUBCUTANEOUS | Status: DC
  Administered 2022-10-07 – 2022-10-08 (×3): 30 [IU] via SUBCUTANEOUS
  Administered 2022-10-09: 15 [IU] via SUBCUTANEOUS
  Filled 2022-10-07 (×5): qty 0.3

## 2022-10-07 MED ORDER — GABAPENTIN 300 MG PO CAPS
300.0000 mg | ORAL_CAPSULE | Freq: Three times a day (TID) | ORAL | Status: DC
  Administered 2022-10-07 – 2022-10-14 (×20): 300 mg via ORAL
  Filled 2022-10-07 (×20): qty 1

## 2022-10-07 MED ORDER — OXYCODONE-ACETAMINOPHEN 5-325 MG PO TABS
1.0000 | ORAL_TABLET | ORAL | Status: DC | PRN
  Administered 2022-10-07 – 2022-10-13 (×8): 1 via ORAL
  Filled 2022-10-07 (×9): qty 1

## 2022-10-07 MED ORDER — GERHARDT'S BUTT CREAM
TOPICAL_CREAM | Freq: Two times a day (BID) | CUTANEOUS | Status: DC
  Administered 2022-10-12: 1 via TOPICAL
  Filled 2022-10-07 (×2): qty 1

## 2022-10-07 MED ORDER — PROCHLORPERAZINE EDISYLATE 10 MG/2ML IJ SOLN: 5.0000 mg | Freq: Four times a day (QID) | INTRAMUSCULAR | Status: DC | PRN

## 2022-10-07 MED ORDER — ACETAMINOPHEN 325 MG PO TABS
325.0000 mg | ORAL_TABLET | ORAL | Status: DC | PRN
  Administered 2022-10-08 – 2022-10-14 (×3): 650 mg via ORAL
  Filled 2022-10-07 (×3): qty 2

## 2022-10-07 MED ORDER — AMLODIPINE BESYLATE 5 MG PO TABS
5.0000 mg | ORAL_TABLET | Freq: Every day | ORAL | Status: DC
  Administered 2022-10-08 – 2022-10-14 (×7): 5 mg via ORAL
  Filled 2022-10-07 (×7): qty 1

## 2022-10-07 MED ORDER — ATORVASTATIN CALCIUM 10 MG PO TABS
20.0000 mg | ORAL_TABLET | Freq: Every day | ORAL | Status: DC
  Administered 2022-10-08 – 2022-10-14 (×7): 20 mg via ORAL
  Filled 2022-10-07 (×7): qty 2

## 2022-10-07 MED ORDER — METHOCARBAMOL 500 MG PO TABS
500.0000 mg | ORAL_TABLET | Freq: Four times a day (QID) | ORAL | Status: DC | PRN
  Administered 2022-10-08 – 2022-10-09 (×4): 500 mg via ORAL
  Filled 2022-10-07 (×4): qty 1

## 2022-10-07 MED ORDER — SORBITOL 70 % SOLN: 30.0000 mL | Freq: Every day | Status: DC | PRN

## 2022-10-07 MED ORDER — BENAZEPRIL HCL 5 MG PO TABS
20.0000 mg | ORAL_TABLET | Freq: Every day | ORAL | Status: DC
  Administered 2022-10-08 – 2022-10-14 (×7): 20 mg via ORAL
  Filled 2022-10-07 (×8): qty 4

## 2022-10-07 MED ORDER — PROCHLORPERAZINE 25 MG RE SUPP: 12.5000 mg | Freq: Four times a day (QID) | RECTAL | Status: DC | PRN

## 2022-10-07 MED ORDER — GERHARDT'S BUTT CREAM: 1.0000 | TOPICAL_CREAM | Freq: Two times a day (BID) | CUTANEOUS | Status: DC

## 2022-10-07 MED ORDER — CETIRIZINE HCL 10 MG PO TABS: 10.0000 mg | ORAL_TABLET | Freq: Every day | ORAL | Status: AC

## 2022-10-07 NOTE — Progress Notes (Signed)
Signed     Expand All Collapse All PMR Admission Coordinator Pre-Admission Assessment   Patient: Dwayne Nelson is an 67 y.o., male MRN: [id number] DOB: 10/30/1955 Height: 5\' 10"  (177.8 cm) Weight: 108.3 kg   Insurance Information HMO:     PPO:      PCP:      IPA:      80/20: yes     OTHER:  PRIMARY: Medicare A & B      Policy#: [id number]      Subscriber: patient CM Name:       Phone#:      Fax#:  Pre-Cert#:       Employer:  Benefits:  Phone #: verified eligibility via [employer] on 10/06/22     Name:  Eff. Date: Part A & B effective 02/23/19     Deduct: $1,632      Out of Pocket Max: NA      Life Max: NA CIR: 100% coverage      SNF: 100% coverage for days 1-20, 80% coverage for days 21-100 Outpatient: 80% coverage     Co-Pay: 20% Home Health: 100% coverage      Co-Pay:  DME: 80% coverage     Co-Pay: 20% Providers: pt's choice SECONDARY: Dwayne Nelson Life      Policy#: [id number]     Phone#: [phone]   Financial Counselor:       Phone#:    The "Data Collection Information Summary" for patients in Inpatient Rehabilitation Facilities with attached "Privacy Act Statement-Health Care Records" was provided and verbally reviewed with: Patient   Emergency Contact Information Contact Information       Name Relation Home Work Mobile    Dwayne Nelson. Son     [phone]         Other Contacts   None on File        Current Medical History  Patient Admitting Diagnosis: spondylolisthesis of lumbar region, s/p L3-5 laminectomy with posterior fusion at L4-5 History of Present Illness: Pt is a 67 year old male with  medical hx significant for: diabetes, HTN, skin CA, hyperlipidemia, recent bilateral L3-4 and L4-5 laminotomy/foraminotomies/medial facetectomy to decompress the bilateral L3, L4 and L5 nerve roots (09/30/22).  Pt [facility]22 d/t increased low back pain. MRI lumbar spine shows postsurgical changes from interval L4-L% posterior spinal fusion  and decompression. Small fluid collection abutting right transpedicular screw at L4 vertebral body which causes mass effect on thecal sac which is severely narrowed and increased from prior examinations. Neurosurgery consulted. Determined MRI showed expected postoperative changes. Therapy evaluations completed and CIR recommended d/t pt's deficits in functional mobility.   Patient's medical record from [facility] has been reviewed by the rehabilitation admission coordinator and physician.   Past Medical History      Past Medical History:  Diagnosis Date   Cancer (HCC)      LPFA skin cancer Dr. Terri Piedra   Diabetes mellitus without complication (HCC)     Hyperlipidemia     Hypertension            Has the patient had major surgery during 100 days prior to admission? Yes   Family History   family history includes Bladder Cancer in his father.   Current Medications  Current Medications    Current Facility-Administered Medications:    acetaminophen (TYLENOL) tablet 650 mg, 650 mg, Oral, Q6H PRN, 650 mg at 10/06/22 0634 **OR** acetaminophen (TYLENOL) suppository 650 mg, 650 mg,  Rectal, Q6H PRN, Doran Durand, Meghan D, NP   amLODipine (NORVASC) tablet 5 mg, 5 mg, Oral, Daily, 5 mg at 10/07/22 1051 **AND** benazepril (LOTENSIN) tablet 20 mg, 20 mg, Oral, Daily, Bergman, Meghan D, NP, 20 mg at 10/07/22 1051   aspirin chewable tablet 81 mg, 81 mg, Oral, Daily, Bergman, Meghan D, NP, 81 mg at 10/07/22 1051   atorvastatin (LIPITOR) tablet 20 mg, 20 mg, Oral, Daily, Bergman, Meghan D, NP, 20 mg at 10/07/22 1051   bisacodyl (DULCOLAX) suppository 10 mg, 10 mg, Rectal, Daily PRN, Doran Durand, Meghan D, NP   docusate sodium (COLACE) capsule 100 mg, 100 mg, Oral, BID, Bergman, Meghan D, NP, 100 mg at 10/07/22 1051   empagliflozin (JARDIANCE) tablet 25 mg, 25 mg, Oral, Daily, Bergman, Meghan D, NP, 25 mg at 10/07/22 1052   feeding supplement (ENSURE ENLIVE / ENSURE PLUS) liquid 237 mL, 237 mL, Oral, BID  BM, Tressie Stalker, MD, 237 mL at 10/05/22 1741   gabapentin (NEURONTIN) capsule 300 mg, 300 mg, Oral, TID, Bergman, Meghan D, NP, 300 mg at 10/07/22 1051   Gerhardt's butt cream, , Topical, BID, Iran Sizer, PA-C, Given at 10/07/22 1105   heparin injection 5,000 Units, 5,000 Units, Subcutaneous, Q8H, Cosentino, Talmadge Chad, PA-C, 5,000 Units at 10/07/22 0535   hydrochlorothiazide (HYDRODIURIL) tablet 25 mg, 25 mg, Oral, Daily, Bergman, Meghan D, NP, 25 mg at 10/07/22 1051   HYDROmorphone (DILAUDID) injection 0.5-1 mg, 0.5-1 mg, Intravenous, Q2H PRN, Bergman, Meghan D, NP   insulin glargine-yfgn (SEMGLEE) injection 30 Units, 30 Units, Subcutaneous, BID, Leander Rams, RPH, 30 Units at 10/07/22 1052   loratadine (CLARITIN) tablet 10 mg, 10 mg, Oral, Daily, Bergman, Meghan D, NP, 10 mg at 10/07/22 1051   metFORMIN (GLUCOPHAGE-XR) 24 hr tablet 1,000 mg, 1,000 mg, Oral, BID WC, Bergman, Meghan D, NP, 1,000 mg at 10/07/22 0536   metoprolol succinate (TOPROL-XL) 24 hr tablet 50 mg, 50 mg, Oral, QHS, Bergman, Meghan D, NP, 50 mg at 10/06/22 2251   metoprolol tartrate (LOPRESSOR) injection 5 mg, 5 mg, Intravenous, Q6H PRN, Bergman, Meghan D, NP   ondansetron (ZOFRAN) tablet 4 mg, 4 mg, Oral, Q6H PRN **OR** ondansetron (ZOFRAN) injection 4 mg, 4 mg, Intravenous, Q6H PRN, Bergman, Meghan D, NP   oxyCODONE-acetaminophen (PERCOCET/ROXICET) 5-325 MG per tablet 1 tablet, 1 tablet, Oral, Q4H PRN, Bergman, Meghan D, NP, 1 tablet at 10/07/22 1051   pioglitazone (ACTOS) tablet 30 mg, 30 mg, Oral, QHS, Bergman, Meghan D, NP, 30 mg at 10/06/22 2251   polyethylene glycol (MIRALAX / GLYCOLAX) packet 17 g, 17 g, Oral, Daily PRN, Bergman, Meghan D, NP   sodium phosphate (FLEET) 7-19 GM/118ML enema 1 enema, 1 enema, Rectal, Once PRN, Bergman, Meghan D, NP   testosterone (ANDROGEL) 50 MG/5GM (1%) gel 5 g, 5 g, Transdermal, Daily, Bergman, Meghan D, NP, 5 g at 10/06/22 2249   zolpidem (AMBIEN) tablet 5 mg, 5 mg,  Oral, QHS PRN, Val Eagle D, NP     Patients Current Diet:  Diet Order                  Diet Carb Modified Fluid consistency: Thin; Room service appropriate? Yes  Diet effective now                         Precautions / Restrictions Precautions Precautions: Fall, Back Precaution Booklet Issued: Yes (comment) Precaution Comments: reviewed spinal precautions during functional mobility Spinal Brace: Lumbar corset, Applied in  sitting position Restrictions Weight Bearing Restrictions: No    Has the patient had 2 or more falls or a fall with injury in the past year? Yes   Prior Activity Level Community (5-7x/wk): drive, gets out of house frequently   Prior Functional Level Self Care: Did the patient need help bathing, dressing, using the toilet or eating? Independent   Indoor Mobility: Did the patient need assistance with walking from room to room (with or without device)? Independent   Stairs: Did the patient need assistance with internal or external stairs (with or without device)? Independent   Functional Cognition: Did the patient need help planning regular tasks such as shopping or remembering to take medications? Independent   Patient Information Are you of Hispanic, Latino/a,or Spanish origin?: A. No, not of Hispanic, Latino/a, or Spanish origin What is your race?: A. White Do you need or want an interpreter to communicate with a doctor or health care staff?: 0. No   Patient's Response To:  Health Literacy and Transportation Is the patient able to respond to health literacy and transportation needs?: Yes Health Literacy - How often do you need to have someone help you when you read instructions, pamphlets, or other written material from your doctor or pharmacy?: Never In the past 12 months, has lack of transportation kept you from medical appointments or from getting medications?: No In the past 12 months, has lack of transportation kept you from meetings, work,  or from getting things needed for daily living?: No   Home Assistive Devices / Equipment Home Assistive Devices/Equipment: Contact lenses, Eyeglasses, Raised toilet seat with rails, Environmental consultant (specify type) (front wheel walker) Home Equipment: Agricultural consultant (2 wheels), Shower seat, Wheelchair - manual   Prior Device Use: Indicate devices/aids used by the patient prior to current illness, exacerbation or injury? None of the above   Current Functional Level Cognition   Overall Cognitive Status: Within Functional Limits for tasks assessed Orientation Level: Oriented X4 General Comments: A&Ox4; Pleasant throughout entire session.-able to describe confusion from last night in detail and aware that he was confused but is not confused today    Extremity Assessment (includes Sensation/Coordination)   Upper Extremity Assessment: Defer to OT evaluation  Lower Extremity Assessment: Generalized weakness RLE Deficits / Details: RLE presents with muscular atrophy at calves.     ADLs   Overall ADL's : Needs assistance/impaired Eating/Feeding: Independent, Sitting Grooming: Min guard, Standing Upper Body Bathing: Set up, Sitting Lower Body Bathing: Minimal assistance, Sit to/from stand Upper Body Dressing : Set up, Sitting Upper Body Dressing Details (indicate cue type and reason): able to don brace while adhering to back precautions without any assistance Lower Body Dressing: Sitting/lateral leans, Min guard Lower Body Dressing Details (indicate cue type and reason): pt uses AE at baseline, has sneakers that are slip on with no tie laces, able to slide L foot into it but needed to cross R leg over knee to get shoe over heel Toilet Transfer: Minimal assistance, Ambulation, Regular Toilet, BSC/3in1, Rolling walker (2 wheels), Cueing for sequencing, Cueing for safety Toilet Transfer Details (indicate cue type and reason): 3n1 over commode in b.room, cues for hand placement on arm rests during sit/Nelson  stand/sit Functional mobility during ADLs: Minimal assistance, Rolling walker (2 wheels) General ADL Comments: has a/e and dme at home, receptive to cues for rw management and hand placement aware he has to work on correction of compensatory habits from prior to sx.     Mobility   Overal bed  mobility: Needs Assistance Bed Mobility: Rolling, Sidelying to Sit Rolling: Min assist Sidelying to sit: Min assist Sit to sidelying: Supervision General bed mobility comments: requires momentum to get rolled onto side with heavy use of bed rail and min A to get trunk elevation.     Transfers   Overall transfer level: Needs assistance Equipment used: Rolling walker (2 wheels) Transfers: Sit to/from Stand, Bed to chair/wheelchair/BSC Sit to Stand: Min assist Bed to/from chair/wheelchair/BSC transfer type:: Step pivot General transfer comment: Cues for hand placement. Min A     Ambulation / Gait / Stairs / Psychologist, prison and probation services   Ambulation/Gait Ambulation/Gait assistance: Land (Feet): 30 Feet Assistive device: Rolling walker (2 wheels) Gait Pattern/deviations: Step-through pattern, Decreased stride length, Shuffle, Trunk flexed General Gait Details: Cues for safety, improving foot clearance B. Pain limited, unable to walk back to room, required ride in recliner. Having severe spasms, stabbing pain once seated in chair in hallway. Gait velocity: decr     Posture / Balance Dynamic Sitting Balance Sitting balance - Comments: Able to sit EOB and donn spinal brace without UE support. Balance Overall balance assessment: Needs assistance Sitting-balance support: Feet supported Sitting balance-Leahy Scale: Good Sitting balance - Comments: Able to sit EOB and donn spinal brace without UE support. Standing balance support: Bilateral upper extremity supported, During functional activity Standing balance-Leahy Scale: Poor Standing balance comment: Reliant on RW support in static stance  and functional mobility.     Special needs/care consideration Skin Abrasion: arm,knee/ right, left; Erythema/Redness: penis, scrotum/bilateral; Surgical incision: vertebral column/lower; Wound- penis and Diabetic management Semglee 30 units 2x daily    Previous Home Environment (from acute therapy documentation) Living Arrangements: Alone (lived with son since March)  Lives With: Son, Other (Comment) (daughter-in-law) Available Help at Discharge: Family, Available PRN/intermittently Type of Home: House Home Layout: One level Home Access: Stairs to enter Entrance Stairs-Rails: Right Entrance Stairs-Number of Steps: 2 Bathroom Shower/Tub: Psychologist, counselling, Engineer, manufacturing systems: Handicapped height Bathroom Accessibility: Yes How Accessible: Accessible via walker Home Care Services: No Additional Comments: Following discharge, will live at [facility] house for a week.   Discharge Living Setting Plans for Discharge Living Setting: House (son's house) Type of Home at Discharge: House Discharge Home Layout: One level Discharge Home Access: Stairs to enter Entrance Stairs-Rails: None Entrance Stairs-Number of Steps: 2 Discharge Bathroom Shower/Tub: Tub/shower unit Discharge Bathroom Toilet: Handicapped height Discharge Bathroom Accessibility: Yes How Accessible: Accessible via walker Does the patient have any problems obtaining your medications?: No   Social/Family/Support Systems Anticipated Caregiver: Dwayne Nelson (son) and daughter-in-law Anticipated Caregiver's Contact Information: [place]: [phone] Caregiver Availability: Intermittent Discharge Plan Discussed with Primary Caregiver: Yes Is Caregiver In Agreement with Plan?: Yes Does Caregiver/Family have Issues with Lodging/Transportation while Pt is in Rehab?: No   Goals Patient/Family Goal for Rehab: Mod I: PT/OT Expected length of stay: 7-10 days Pt/Family Agrees to Admission and willing to participate: Yes Program  Orientation Provided & Reviewed with Pt/Caregiver Including Roles  & Responsibilities: Yes   Decrease burden of Care through IP rehab admission: NA   Possible need for SNF placement upon discharge: Not anticipated   Patient Condition: I have reviewed medical records from [facility], spoken with CM, and patient and son. I met with patient at the bedside and discussed via phone for inpatient rehabilitation assessment.  Patient will benefit from ongoing PT and OT, can actively participate in 3 hours of therapy a day 5 days of the week, and  can make measurable gains during the admission.  Patient will also benefit from the coordinated team approach during an Inpatient Acute Rehabilitation admission.  The patient will receive intensive therapy as well as Rehabilitation physician, nursing, social worker, and care management interventions.  Due to safety, skin/wound care, disease management, medication administration, pain management, and patient education the patient requires 24 hour a day rehabilitation nursing.  The patient is currently Min G-Min A with mobility and Min G with basic ADLs.  Discharge setting and therapy post discharge at home with home health is anticipated.  Patient has agreed to participate in the Acute Inpatient Rehabilitation Program and will admit today.   Preadmission Screen Completed By:  Domingo Pulse, 10/07/2022 11:28 AM ______________________________________________________________________   Discussed status with Dr. Wynn Banker on 10/07/22  at 11:28 AM and received approval for admission today.   Admission Coordinator:  Domingo Pulse, CCC-SLP, time 11:28 AM/Date 10/07/22     Assessment/Plan: Diagnosis:  Lumbar spinal stenosis Does the need for close, 24 hr/day Medical supervision in concert with the patient's rehab needs make it unreasonable for this patient to be served in a less intensive setting? Yes Co-Morbidities requiring supervision/potential  complications: HTN. DM Type 2  Due to bladder management, bowel management, safety, skin/wound care, disease management, medication administration, pain management, and patient education, does the patient require 24 hr/day rehab nursing? Yes Does the patient require coordinated care of a physician, rehab nurse, PT, OT, and SLP to address physical and functional deficits in the context of the above medical diagnosis(es)? Yes Addressing deficits in the following areas: balance, endurance, locomotion, strength, transferring, bowel/bladder control, bathing, dressing, feeding, grooming, and psychosocial support Can the patient actively participate in an intensive therapy program of at least 3 hrs of therapy 5 days a week? Yes The potential for patient to make measurable gains while on inpatient rehab is good Anticipated functional outcomes upon discharge from inpatient rehab: modified independent PT, modified independent OT, n/a SLP Estimated rehab length of stay to reach the above functional goals is: 7-10d Anticipated discharge destination: Home 10. Overall Rehab/Functional Prognosis: good     MD Signature: Erick Colace M.D. [facility] Fellow Am Acad of Phys Med and Rehab Diplomate Am Board of Electrodiagnostic Med Fellow Am Board of Interventional Pain

## 2022-10-07 NOTE — Plan of Care (Signed)

## 2022-10-07 NOTE — Progress Notes (Signed)
Inpatient Rehab Admissions Coordinator:  There is a bed available for pt in CIR today. Val Eagle, NP aware and in agreement. Pt, pt's son Ayo Smoak, NSG, and TOC made aware.    Wolfgang Phoenix, MS, CCC-SLP Admissions Coordinator [phone]

## 2022-10-07 NOTE — H&P (Incomplete)
Physical Medicine and Rehabilitation Admission H&P   CC: Functional deficits secondary to underlying spondylolisthesis of the lumbar region status post lumbar decompression  HPI: Dwayne Nelson is a 67 year old male with a history of spondylolisthesis of the lumbar region who is status post lumbar decompression at L3, L4, and L5 with posterior fusion at L4-5 by Dr. Lovell Sheehan on 09/30/2022.  He was discharged home on 10/01/2022.  He presented to the [facility] emergency department on 7/12 with increasing pain not relieved by prescriptive medications.  The patient is accompanied by his son with whom he was staying after discharge.  The patient's states he took the last week off from work to care for his father and is unable to provide adequate care and cannot take further time off from work.  Follow-up imaging demonstrated anticipated postoperative findings with stable appearing stenosis at L3-4 and decreased thecal sac narrowing L4-5.  Moderate stenosis noted.  Neurosurgery admitted the patient and felt he would improve with continued time, therapy and would pursue inpatient rehabilitation versus skilled nursing facility placement.  Blood work reveals normal CBC and slight elevation of blood glucose.  Multiple agents for diabetes mellitus and hypertension restarted.  Vital signs are stable.  He is tolerating his diet.The patient requires inpatient medicine and rehabilitation evaluations and services for ongoing dysfunction secondary to spondylolisthesis of the lumbar region status post lumbar decompression.   Review of Systems  Eyes:  Negative for blurred vision.  Respiratory:  Negative for cough and shortness of breath.   Cardiovascular:  Negative for chest pain and palpitations.  Gastrointestinal:  Negative for abdominal pain, constipation and vomiting.  Genitourinary:  Positive for urgency. Negative for dysuria.  Musculoskeletal:  Positive for back pain.  Neurological:  Negative for dizziness and headaches.   Psychiatric/Behavioral:  Negative for depression. The patient does not have insomnia.    Past Medical History:  Diagnosis Date   Cancer [facility])    LPFA skin cancer Dr. Terri Piedra   Diabetes mellitus without complication (HCC)    Hyperlipidemia    Hypertension    Past Surgical History:  Procedure Laterality Date   CIRCUMCISION N/A 03/29/2021   Procedure: DORSAL SLIT CIRCUMCISION ADULT;  Surgeon: Marcine Matar, MD;  Location: [facility];  Service: Urology;  Laterality: N/A;   colonoscopy N/A    NO PAST SURGERIES     SKIN CANCER EXCISION     With Dr Terri Piedra outpatient procedure   Family History  Problem Relation Age of Onset   Bladder Cancer Father    Social History:  reports that he has never smoked. He has never used smokeless tobacco. He reports that he does not currently use alcohol. He reports that he does not use drugs. Allergies: No Known Allergies Medications Prior to Admission  Medication Sig Dispense Refill   acetaminophen (TYLENOL) 650 MG CR tablet Take 1,300 mg by mouth every 8 (eight) hours as needed for pain.     aspirin 81 MG chewable tablet Chew 81 mg by mouth daily.     cyclobenzaprine (FLEXERIL) 5 MG tablet Take 1 tablet (5 mg total) by mouth 3 (three) times daily as needed for muscle spasms. 30 tablet 1   docusate sodium (COLACE) 100 MG capsule Take 1 capsule (100 mg total) by mouth 2 (two) times daily. 10 capsule 0   empagliflozin (JARDIANCE) 25 MG TABS tablet Take 25 mg by mouth daily.     gabapentin (NEURONTIN) 300 MG capsule Take 300 mg by mouth 3 (three) times daily.  hydrochlorothiazide (HYDRODIURIL) 25 MG tablet Take 25 mg by mouth daily.     Insulin Glargine-Lixisenatide (SOLIQUA) 100-33 UNT-MCG/ML SOPN Inject 30 Units into the skin in the morning and at bedtime.     metFORMIN (GLUCOPHAGE-XR) 500 MG 24 hr tablet Take 1,000 mg by mouth 2 (two) times daily with a meal.     metoprolol succinate (TOPROL-XL) 50 MG 24 hr tablet Take 50 mg by  mouth at bedtime. Take with or immediately following a meal.     Multiple Vitamins-Minerals (CENTRUM SILVER ULTRA MENS PO) Take 1 tablet by mouth daily with breakfast.     oxyCODONE-acetaminophen (PERCOCET) 5-325 MG tablet Take 1 tablet by mouth every 4 (four) hours as needed for severe pain. 40 tablet 0   pioglitazone (ACTOS) 30 MG tablet Take 30 mg by mouth at bedtime.     Testosterone 30 MG/ACT SOLN Apply 2 Pump topically daily.     amLODipine-benazepril (LOTREL) 5-20 MG capsule Take 1 capsule by mouth at bedtime. (Patient not taking: Reported on 10/05/2022)     atorvastatin (LIPITOR) 20 MG tablet Take 20 mg by mouth daily. (Patient not taking: Reported on 10/05/2022)        Home: Home Living Family/patient expects to be discharged to:: Private residence Living Arrangements: Alone (lived with son since March) Available Help at Discharge: Family, Available PRN/intermittently Type of Home: House Home Access: Stairs to enter Secretary/administrator of Steps: 2 Entrance Stairs-Rails: Right Home Layout: One level Bathroom Shower/Tub: Psychologist, counselling, Nurse, adult Toilet: Handicapped height Bathroom Accessibility: Yes Home Equipment: Agricultural consultant (2 wheels), Information systems manager, Wheelchair - manual Additional Comments: Following discharge, will live at [facility] house for a week.  Lives With: Son, Other (Comment) (daughter-in-law)   Functional History: Prior Function Prior Level of Function : Independent/Modified Independent, Driving Mobility Comments: Independent with intermittent use of walking stick or RW due to pain. ADLs Comments: independent  Functional Status:  Mobility: Bed Mobility Overal bed mobility: Needs Assistance Bed Mobility: Rolling, Sidelying to Sit Rolling: Min assist Sidelying to sit: Min assist Sit to sidelying: Supervision General bed mobility comments: requires momentum to get rolled onto side with heavy use of bed rail and min A to get trunk  elevation. Transfers Overall transfer level: Needs assistance Equipment used: Rolling walker (2 wheels) Transfers: Sit to/from Stand, Bed to chair/wheelchair/BSC Sit to Stand: Min assist Bed to/from chair/wheelchair/BSC transfer type:: Step pivot General transfer comment: Cues for hand placement. Min A Ambulation/Gait Ambulation/Gait assistance: Min Paediatric nurse (Feet): 30 Feet Assistive device: Rolling walker (2 wheels) Gait Pattern/deviations: Step-through pattern, Decreased stride length, Shuffle, Trunk flexed General Gait Details: Cues for safety, improving foot clearance B. Pain limited, unable to walk back to room, required ride in recliner. Having severe spasms, stabbing pain once seated in chair in hallway. Gait velocity: decr    ADL: ADL Overall ADL's : Needs assistance/impaired Eating/Feeding: Independent, Sitting Grooming: Min guard, Standing Upper Body Bathing: Set up, Sitting Lower Body Bathing: Minimal assistance, Sit to/from stand Upper Body Dressing : Set up, Sitting Upper Body Dressing Details (indicate cue type and reason): able to don brace while adhering to back precautions without any assistance Lower Body Dressing: Sitting/lateral leans, Min guard Lower Body Dressing Details (indicate cue type and reason): pt uses AE at baseline, has sneakers that are slip on with no tie laces, able to slide L foot into it but needed to cross R leg over knee to get shoe over heel Toilet Transfer: Minimal assistance, Ambulation, Regular Toilet,  BSC/3in1, Rolling walker (2 wheels), Cueing for sequencing, Cueing for safety Toilet Transfer Details (indicate cue type and reason): 3n1 over commode in b.room, cues for hand placement on arm rests during sit/stands stand/sit Functional mobility during ADLs: Minimal assistance, Rolling walker (2 wheels) General ADL Comments: has a/e and dme at home, receptive to cues for rw management and hand placement aware he has to work on  correction of compensatory habits from prior to sx.  Cognition: Cognition Overall Cognitive Status: Within Functional Limits for tasks assessed Orientation Level: Oriented X4 Cognition Arousal/Alertness: Awake/alert Behavior During Therapy: WFL for tasks assessed/performed Overall Cognitive Status: Within Functional Limits for tasks assessed General Comments: A&Ox4; Pleasant throughout entire session.-able to describe confusion from last night in detail and aware that he was confused but is not confused today  Physical Exam: Blood pressure 106/69, pulse 66, temperature 97.9 F (36.6 C), resp. rate 18, height 5\' 10"  (1.778 m), weight 108.3 kg, SpO2 93%. Physical Exam Constitutional:      General: He is not in acute distress.    Appearance: He is obese.  HENT:     Head: Normocephalic and atraumatic.  Eyes:     Extraocular Movements: Extraocular movements intact.     Pupils: Pupils are equal, round, and reactive to light.  Cardiovascular:     Rate and Rhythm: Normal rate and regular rhythm.  Pulmonary:     Effort: Pulmonary effort is normal.     Breath sounds: Normal breath sounds.  Abdominal:     General: Bowel sounds are normal.     Palpations: Abdomen is soft.  Musculoskeletal:     Right lower leg: No edema.  Skin:    General: Skin is warm and dry.     Comments: Incision healing without signs of infection  Neurological:     Mental Status: He is alert and oriented to person, place, and time.  Psychiatric:        Mood and Affect: Mood normal.        Behavior: Behavior normal.     Results for orders placed or performed during the hospital encounter of 10/04/22 (from the past 48 hour(s))  Glucose, capillary     Status: Abnormal   Collection Time: 10/05/22 12:44 PM  Result Value Ref Range   Glucose-Capillary 242 (H) 70 - 99 mg/dL    Comment: Glucose reference range applies only to samples taken after fasting for at least 8 hours.  Glucose, capillary     Status: Abnormal    Collection Time: 10/05/22  8:16 PM  Result Value Ref Range   Glucose-Capillary 200 (H) 70 - 99 mg/dL    Comment: Glucose reference range applies only to samples taken after fasting for at least 8 hours.   No results found.    Blood pressure 106/69, pulse 66, temperature 97.9 F (36.6 C), resp. rate 18, height 5\' 10"  (1.778 m), weight 108.3 kg, SpO2 93%.  Medical Problem List and Plan: 1. Functional deficits secondary to ***  -patient may *** shower  -ELOS/Goals: ***  2.  Antithrombotics: -DVT/anticoagulation:  Pharmaceutical: Heparin  -antiplatelet therapy: Aspirin 81 mg daily  3. Pain Management: Tylenol as needed  -Continue gabapentin 300 mg 3 times daily  4. Mood/Behavior/Sleep: LCSW to evaluate and provide emotional support  -antipsychotic agents: n/a  5. Neuropsych/cognition: This patient is capable of making decisions on his own behalf.  6. Skin/Wound Care: Routine skin care checks   7. Fluids/Electrolytes/Nutrition: Routine Is and Os and follow-up chemistries  8: Hypertension:  monitor TID and prn  -Continue amlodipine 5 mg daily  -Continue metoprolol succinate 50 mg nightly  -Continue benazepril 20 mg daily  -Continue hydrochlorothiazide 25 mg daily  9: Hyperlipidemia: continue statin  10: DM: CBGs 4 times daily; A1c = 7.5% (home = Jardiance, Soliqua,metformin, Actos)  -Continue Jardiance 25 mg daily  -Continue Semglee 30 units twice daily  -Continue metformin 1000 mg twice daily  -Continue Actos 30 mg nightly       ***  Milinda Antis, PA-C 10/07/2022

## 2022-10-07 NOTE — Progress Notes (Signed)
Inpatient Rehabilitation Admission Medication Review by a Pharmacist  A complete drug regimen review was completed for this patient to identify any potential clinically significant medication issues.  High Risk Drug Classes Is patient taking? Indication by Medication  Antipsychotic Yes Compazine prn N/V  Anticoagulant Yes Sq heparin - VTE ppx  Antibiotic No   Opioid Yes Percocet - prn pain  Antiplatelet No   Hypoglycemics/insulin Yes Jardiance, Actos, metformin, insulin- DM  Vasoactive Medication Yes Amlodipine, benazepril, hydrochlorothiazide, metoprolol - HTN  Chemotherapy No   Other Yes Trazodone prn sleep Atorvastatin - HLD Gabapentin - pain Testosterone - Hormone     Type of Medication Issue Identified Description of Issue Recommendation(s)  Drug Interaction(s) (clinically significant)     Duplicate Therapy     Allergy     No Medication Administration End Date     Incorrect Dose     Additional Drug Therapy Needed     Significant med changes from prior encounter (inform family/care partners about these prior to discharge).    Other       Clinically significant medication issues were identified that warrant physician communication and completion of prescribed/recommended actions by midnight of the next day:  No  Pharmacist comments: None  Time spent performing this drug regimen review (minutes):  20 minutes  Thank you Okey Regal, PharmD

## 2022-10-07 NOTE — H&P (Addendum)
Physical Medicine and Rehabilitation Admission H&P     CC: Functional deficits secondary to underlying spondylolisthesis of the lumbar region status post lumbar decompression   HPI: Dwayne Nelson is a 67 year old male with a history of spondylolisthesis of the lumbar region who is status post lumbar decompression at L3, L4, and L5 with posterior fusion at L4-5 by Dr. Lovell Sheehan on 09/30/2022.  He was discharged home on 10/01/2022.  He presented to the [facility] emergency department on 7/12 with increasing pain not relieved by prescriptive medications.  The patient is accompanied by his son with whom he was staying after discharge.  The patient's states he took the last week off from work to care for his father and is unable to provide adequate care and cannot take further time off from work.  Follow-up imaging demonstrated anticipated postoperative findings with stable appearing stenosis at L3-4 and decreased thecal sac narrowing L4-5.  Moderate stenosis noted.  Neurosurgery admitted the patient and felt he would improve with continued time, therapy and would pursue inpatient rehabilitation versus skilled nursing facility placement.  Blood work reveals normal CBC and slight elevation of blood glucose.  Multiple agents for diabetes mellitus and hypertension restarted.  Vital signs are stable.  He is tolerating his diet.The patient requires inpatient medicine and rehabilitation evaluations and services for ongoing dysfunction secondary to spondylolisthesis of the lumbar region status post lumbar decompression.   Patient gives a history of fall in a store last February resulting in back injury.  Initial evaluation with CT scan in the emergency department revealed superior endplate compression fracture of L4 vertebral body with mild retropulsion as well as nondisplaced fracture of both L4 pedicles.  Patient was admitted and then discharged to a skilled nursing facility.  At that time no surgery was advised.  Patient followed  up with neurosurgery who performed flexion-extension views of the lumbar spine and found instability.  Patient was scheduled for elective lumbar decompression and fusion  Occasional incontinence of bowel but having bowel movements.  No urinary retention noted.  Continent of urine.   ROS Review of Systems  Constitutional:  Negative for chills and fever.  HENT:  Negative for nosebleeds.   Eyes:  Negative for pain, discharge and redness.  Respiratory:  Negative for cough, hemoptysis, sputum production, shortness of breath, wheezing and stridor.   Cardiovascular:  Positive for chest pain. Negative for leg swelling.  Gastrointestinal:  Negative for abdominal pain, constipation, heartburn, nausea and vomiting.  Genitourinary:  Negative for dysuria and hematuria.  Musculoskeletal:  Positive for back pain and falls.  Skin:  Negative for itching and rash.  Neurological:  Positive for weakness. Negative for dizziness and sensory change.  Endo/Heme/Allergies:  Does not bruise/bleed easily.  Psychiatric/Behavioral:  Negative for hallucinations and memory loss.         Past Medical History:  Diagnosis Date   Cancer [facility])      LPFA skin cancer Dr. Terri Piedra   Diabetes mellitus without complication (HCC)     Hyperlipidemia     Hypertension               Past Surgical History:  Procedure Laterality Date   CIRCUMCISION N/A 03/29/2021    Procedure: DORSAL SLIT CIRCUMCISION ADULT;  Surgeon: Marcine Matar, MD;  Location: [facility];  Service: Urology;  Laterality: N/A;   colonoscopy N/A     NO PAST SURGERIES       SKIN CANCER EXCISION        With Dr Terri Piedra  outpatient procedure             Family History  Problem Relation Age of Onset   Bladder Cancer Father          Social History:  reports that he has never smoked. He has never used smokeless tobacco. He reports that he does not currently use alcohol. He reports that he does not use drugs. Allergies:  Allergies  No  Known Allergies         Medications Prior to Admission  Medication Sig Dispense Refill   acetaminophen (TYLENOL) 650 MG CR tablet Take 1,300 mg by mouth every 8 (eight) hours as needed for pain.       aspirin 81 MG chewable tablet Chew 81 mg by mouth daily.       cyclobenzaprine (FLEXERIL) 5 MG tablet Take 1 tablet (5 mg total) by mouth 3 (three) times daily as needed for muscle spasms. 30 tablet 1   docusate sodium (COLACE) 100 MG capsule Take 1 capsule (100 mg total) by mouth 2 (two) times daily. 10 capsule 0   empagliflozin (JARDIANCE) 25 MG TABS tablet Take 25 mg by mouth daily.       gabapentin (NEURONTIN) 300 MG capsule Take 300 mg by mouth 3 (three) times daily.       hydrochlorothiazide (HYDRODIURIL) 25 MG tablet Take 25 mg by mouth daily.       Insulin Glargine-Lixisenatide (SOLIQUA) 100-33 UNT-MCG/ML SOPN Inject 30 Units into the skin in the morning and at bedtime.       metFORMIN (GLUCOPHAGE-XR) 500 MG 24 hr tablet Take 1,000 mg by mouth 2 (two) times daily with a meal.       metoprolol succinate (TOPROL-XL) 50 MG 24 hr tablet Take 50 mg by mouth at bedtime. Take with or immediately following a meal.       Multiple Vitamins-Minerals (CENTRUM SILVER ULTRA MENS PO) Take 1 tablet by mouth daily with breakfast.       oxyCODONE-acetaminophen (PERCOCET) 5-325 MG tablet Take 1 tablet by mouth every 4 (four) hours as needed for severe pain. 40 tablet 0   pioglitazone (ACTOS) 30 MG tablet Take 30 mg by mouth at bedtime.       Testosterone 30 MG/ACT SOLN Apply 2 Pump topically daily.       amLODipine-benazepril (LOTREL) 5-20 MG capsule Take 1 capsule by mouth at bedtime. (Patient not taking: Reported on 10/05/2022)       atorvastatin (LIPITOR) 20 MG tablet Take 20 mg by mouth daily. (Patient not taking: Reported on 10/05/2022)                  Home: Home Living Family/patient expects to be discharged to:: Private residence Living Arrangements: Alone (lived with son since  March) Available Help at Discharge: Family, Available PRN/intermittently Type of Home: House Home Access: Stairs to enter Secretary/administrator of Steps: 2 Entrance Stairs-Rails: Right Home Layout: One level Bathroom Shower/Tub: Psychologist, counselling, Nurse, adult Toilet: Handicapped height Bathroom Accessibility: Yes Home Equipment: Agricultural consultant (2 wheels), Information systems manager, Wheelchair - manual Additional Comments: Following discharge, will live at [facility] house for a week.  Lives With: Son, Other (Comment) (daughter-in-law)   Functional History: Prior Function Prior Level of Function : Independent/Modified Independent, Driving Mobility Comments: Independent with intermittent use of walking stick or RW due to pain. ADLs Comments: independent   Functional Status:  Mobility: Bed Mobility Overal bed mobility: Needs Assistance Bed Mobility: Rolling, Sidelying to Sit Rolling: Min assist Sidelying  to sit: Min assist Sit to sidelying: Supervision General bed mobility comments: requires momentum to get rolled onto side with heavy use of bed rail and min A to get trunk elevation. Transfers Overall transfer level: Needs assistance Equipment used: Rolling walker (2 wheels) Transfers: Sit to/from Stand, Bed to chair/wheelchair/BSC Sit to Stand: Min assist Bed to/from chair/wheelchair/BSC transfer type:: Step pivot General transfer comment: Cues for hand placement. Min A Ambulation/Gait Ambulation/Gait assistance: Min Paediatric nurse (Feet): 30 Feet Assistive device: Rolling walker (2 wheels) Gait Pattern/deviations: Step-through pattern, Decreased stride length, Shuffle, Trunk flexed General Gait Details: Cues for safety, improving foot clearance B. Pain limited, unable to walk back to room, required ride in recliner. Having severe spasms, stabbing pain once seated in chair in hallway. Gait velocity: decr   ADL: ADL Overall ADL's : Needs assistance/impaired Eating/Feeding:  Independent, Sitting Grooming: Min guard, Standing Upper Body Bathing: Set up, Sitting Lower Body Bathing: Minimal assistance, Sit to/from stand Upper Body Dressing : Set up, Sitting Upper Body Dressing Details (indicate cue type and reason): able to don brace while adhering to back precautions without any assistance Lower Body Dressing: Sitting/lateral leans, Min guard Lower Body Dressing Details (indicate cue type and reason): pt uses AE at baseline, has sneakers that are slip on with no tie laces, able to slide L foot into it but needed to cross R leg over knee to get shoe over heel Toilet Transfer: Minimal assistance, Ambulation, Regular Toilet, BSC/3in1, Rolling walker (2 wheels), Cueing for sequencing, Cueing for safety Toilet Transfer Details (indicate cue type and reason): 3n1 over commode in b.room, cues for hand placement on arm rests during sit/stands stand/sit Functional mobility during ADLs: Minimal assistance, Rolling walker (2 wheels) General ADL Comments: has a/e and dme at home, receptive to cues for rw management and hand placement aware he has to work on correction of compensatory habits from prior to sx.   Cognition: Cognition Overall Cognitive Status: Within Functional Limits for tasks assessed Orientation Level: Oriented X4 Cognition Arousal/Alertness: Awake/alert Behavior During Therapy: WFL for tasks assessed/performed Overall Cognitive Status: Within Functional Limits for tasks assessed General Comments: A&Ox4; Pleasant throughout entire session.-able to describe confusion from last night in detail and aware that he was confused but is not confused today   Physical Exam: Blood pressure 106/69, pulse 66, temperature 97.9 F (36.6 C), resp. rate 18, height 5\' 10"  (1.778 m), weight 108.3 kg, SpO2 93%. Physical Exam   Lab Results Last 48 Hours        Results for orders placed or performed during the hospital encounter of 10/04/22 (from the past 48 hour(s))   Glucose, capillary     Status: Abnormal    Collection Time: 10/05/22 12:44 PM  Result Value Ref Range    Glucose-Capillary 242 (H) 70 - 99 mg/dL      Comment: Glucose reference range applies only to samples taken after fasting for at least 8 hours.  Glucose, capillary     Status: Abnormal    Collection Time: 10/05/22  8:16 PM  Result Value Ref Range    Glucose-Capillary 200 (H) 70 - 99 mg/dL      Comment: Glucose reference range applies only to samples taken after fasting for at least 8 hours.      Imaging Results (Last 48 hours)  No results found.         Blood pressure 106/69, pulse 66, temperature 97.9 F (36.6 C), resp. rate 18, height 5\' 10"  (1.778 m), weight 108.3 kg,  SpO2 93%.   General: No acute distress Mood and affect mildly irritable but appropriate Heart: Regular rate and rhythm no rubs murmurs or extra sounds Lungs: Clear to auscultation, breathing unlabored, no rales or wheezes Abdomen: Positive bowel sounds, soft nontender to palpation, nondistended Extremities: No clubbing, cyanosis, or edema Skin: No evidence of breakdown, no evidence of rash, lumbar incision clean dry well-approximated no drainage induration or fluctuance Neurologic: Alert and oriented x 3 motor strength is 5/5 in bilateral deltoid, bicep, tricep, grip, 4/5 bilateral hip flexor, knee extensors, ankle dorsiflexor and plantar flexor Sensory exam normal sensation to light touch bilateral upper and lower extremities Tone is normal in bilateral lower extremities Musculoskeletal: Full range of motion in all 4 extremities. No joint swelling  Medical Problem List and Plan: 1. Functional deficits secondary to lumbar spondylolisthesis with lumbar stenosis              -patient may  shower             -ELOS/Goals: 7-10d, Mod I   2.  Antithrombotics: -DVT/anticoagulation:  Pharmaceutical: Heparin             -antiplatelet therapy: Aspirin 81 mg daily   3. Pain Management: Tylenol as needed              -Continue gabapentin 300 mg 3 times daily   4. Mood/Behavior/Sleep: LCSW to evaluate and provide emotional support             -antipsychotic agents: n/a   5. Neuropsych/cognition: This patient is capable of making decisions on his own behalf.   6. Skin/Wound Care: Routine skin care checks   7. Fluids/Electrolytes/Nutrition: Routine Is and Os and follow-up chemistries   8: Hypertension: monitor TID and prn             -Continue amlodipine 5 mg daily             -Continue metoprolol succinate 50 mg nightly             -Continue benazepril 20 mg daily             -Continue hydrochlorothiazide 25 mg daily   9: Hyperlipidemia: continue statin   10: DM: CBGs 4 times daily; A1c = 7.5% (home = Jardiance, Soliqua,metformin, Actos)             -Continue Jardiance 25 mg daily             -Continue Semglee 30 units twice daily             -Continue metformin 1000 mg twice daily             -Continue Actos 30 mg nightly    Milinda Antis, PA-C 10/07/2022 "I have personally performed a face to face diagnostic evaluation of this patient.  Additionally, I have reviewed and concur with the physician assistant's documentation above." Erick Colace M.D. [facility] Fellow Am Acad of Phys Med and Rehab Diplomate Am Board of Electrodiagnostic Med Fellow Am Board of Interventional Pain

## 2022-10-07 NOTE — Progress Notes (Signed)
Physical Therapy Treatment Patient Details Name: Dwayne Nelson MRN: [id number] DOB: March 30, 1955 Today's Date: 10/07/2022   History of Present Illness 67 y.o. male s/p Bilateral L3-4 and L4-5 laminotomy/foraminotomies/medial facetectomy (09/30/2022). PMH significant for: CA, DM, HLD, and HTN.    PT Comments  Patient received in bed, he is agreeable to PT session. Reports little pain initially. He requires min A for rolling and going from side lying to sitting with heavy bed rail use. Patient is able to stand with min A and cues. Ambulated 30 feet and then needed to sit. He began having severe spasms and stabbing pains in back and into legs. He was unable to ambulate back to room and was pushed in recliner. Patient will continue to benefit from skilled PT to improve functional mobility, strength and independence.       Assistance Recommended at Discharge Intermittent Supervision/Assistance  If plan is discharge home, recommend the following:  Can travel by private vehicle    A lot of help with walking and/or transfers;A lot of help with bathing/dressing/bathroom;Help with stairs or ramp for entrance;Assist for transportation      Equipment Recommendations  None recommended by PT    Recommendations for Other Services       Precautions / Restrictions Precautions Precautions: Fall;Back Precaution Booklet Issued: Yes (comment) Precaution Comments: reviewed spinal precautions during functional mobility Required Braces or Orthoses: Spinal Brace Spinal Brace: Lumbar corset;Applied in sitting position Restrictions Weight Bearing Restrictions: No     Mobility  Bed Mobility Overal bed mobility: Needs Assistance Bed Mobility: Rolling, Sidelying to Sit Rolling: Min assist Sidelying to sit: Min assist       General bed mobility comments: requires momentum to get rolled onto side with heavy use of bed rail and min A to get trunk elevation.    Transfers Overall transfer level: Needs  assistance Equipment used: Rolling walker (2 wheels)   Sit to Stand: Min assist           General transfer comment: Cues for hand placement. Min A    Ambulation/Gait Ambulation/Gait assistance: Min Paediatric nurse (Feet): 30 Feet Assistive device: Rolling walker (2 wheels) Gait Pattern/deviations: Step-through pattern, Decreased stride length, Shuffle, Trunk flexed Gait velocity: decr     General Gait Details: Cues for safety, improving foot clearance B. Pain limited, unable to walk back to room, required ride in recliner. Having severe spasms, stabbing pain once seated in chair in hallway.   Stairs             Wheelchair Mobility     Tilt Bed    Modified Rankin (Stroke Patients Only)       Balance Overall balance assessment: Needs assistance Sitting-balance support: Feet supported Sitting balance-Leahy Scale: Good     Standing balance support: Bilateral upper extremity supported, During functional activity Standing balance-Leahy Scale: Poor Standing balance comment: Reliant on RW support in static stance and functional mobility.                            Cognition Arousal/Alertness: Awake/alert Behavior During Therapy: WFL for tasks assessed/performed Overall Cognitive Status: Within Functional Limits for tasks assessed                                          Exercises      General Comments  Pertinent Vitals/Pain Pain Assessment Pain Assessment: Faces Faces Pain Scale: Hurts whole lot Pain Location: back, legs Pain Descriptors / Indicators: Operative site guarding, Discomfort, Sore, Stabbing, Spasm Pain Intervention(s): Monitored during session, Repositioned    Home Living                          Prior Function            PT Goals (current goals can now be found in the care plan section) Acute Rehab PT Goals Patient Stated Goal: go to rehab then home, improve pain PT Goal  Formulation: With patient Time For Goal Achievement: 10/19/22 Potential to Achieve Goals: Good Progress towards PT goals: Progressing toward goals    Frequency           PT Plan Current plan remains appropriate    Co-evaluation              AM-PAC PT "6 Clicks" Mobility   Outcome Measure  Help needed turning from your back to your side while in a flat bed without using bedrails?: A Lot Help needed moving from lying on your back to sitting on the side of a flat bed without using bedrails?: A Lot Help needed moving to and from a bed to a chair (including a wheelchair)?: A Little Help needed standing up from a chair using your arms (e.g., wheelchair or bedside chair)?: A Little Help needed to walk in hospital room?: A Lot Help needed climbing 3-5 steps with a railing? : Total 6 Click Score: 13    End of Session Equipment Utilized During Treatment: Gait belt;Back brace Activity Tolerance: Patient limited by pain;Patient limited by fatigue Patient left: in chair;with call bell/phone within reach Nurse Communication: Mobility status PT Visit Diagnosis: Muscle weakness (generalized) (M62.81);History of falling (Z91.81);Difficulty in walking, not elsewhere classified (R26.2);Pain Pain - part of body:  (back)     Time: 7106-2694 PT Time Calculation (min) (ACUTE ONLY): 28 min  Charges:    $Gait Training: 23-37 mins PT General Charges $$ ACUTE PT VISIT: 1 Visit                     Jailey Booton, PT, GCS 10/07/22,10:09 AM

## 2022-10-07 NOTE — Progress Notes (Signed)
Patient ID: Dwayne Nelson, male   DOB: 1955-07-26, 67 y.o.   MRN: [id number] Subjective: The patient is alert and pleasant.  He complains of back pain.  He said no trouble with his wound.  Objective: Vital signs in last 24 hours: Temp:  [97.9 F (36.6 C)-98.4 F (36.9 C)] 97.9 F (36.6 C) (07/15 0810) Pulse Rate:  [66-73] 66 (07/15 0810) Resp:  [15-18] 18 (07/15 0810) BP: (93-138)/(63-80) 106/69 (07/15 0810) SpO2:  [93 %-96 %] 93 % (07/15 0810) Estimated body mass index is 34.26 kg/m as calculated from the following:   Height as of this encounter: 5\' 10"  (1.778 m).   Weight as of this encounter: 108.3 kg.   Intake/Output from previous day: 07/14 0701 - 07/15 0700 In: 120 [P.O.:120] Out: 200 [Urine:200] Intake/Output this shift: Total I/O In: -  Out: 250 [Urine:250]  Physical exam the patient is alert and oriented.  His lower extremity strength is normal.  His lumbar incision is healing well.  Lab Results: Recent Labs    10/04/22 1301  WBC 7.3  HGB 13.0  HCT 41.1  PLT 239   BMET Recent Labs    10/04/22 1301  NA 139  K 4.0  CL 107  CO2 18*  GLUCOSE 143*  BUN 15  CREATININE 0.97  CALCIUM 9.0    Studies/Results: No results found.  Assessment/Plan: Postop day #7: It looks like the patient would benefit from inpatient rehab.  LOS: 3 days     [name] 10/07/2022, 9:55 AM

## 2022-10-07 NOTE — Discharge Summary (Signed)
Physician Discharge Summary     Providing Compassionate, Quality Care - Together   Patient ID: Dwayne Nelson MRN: [id number] DOB/AGE: 07-28-1955 67 y.o.  Admit date: 10/04/2022 Discharge date: 10/07/2022  Admission Diagnoses:   Discharge Diagnoses:  Principal Problem:   Spondylolisthesis of lumbar region   Discharged Condition: good  Hospital Course: Patient underwent a lumbar decompression at L3, L4, and L5, with posterior fusion at L4-5 by Dr. Lovell Sheehan on 09/30/2022. His postoperative course was uncomplicated and he was discharged home on 10/01/2022. He has been reevaluated by both physical and occupational therapies who feel the patient would benefit from further rehabilitation at [facility].His pain is reasonably controlled with oral pain medication. He is ready for discharge to CIR.   Consults: rehabilitation medicine  Significant Diagnostic Studies: MR LUMBAR SPINE WO CONTRAST  Result Date: 10/04/2022 CLINICAL DATA:  Low back pain, cauda equina syndrome suspected EXAM: MRI LUMBAR SPINE WITHOUT CONTRAST TECHNIQUE: Multiplanar, multisequence MR imaging of the lumbar spine was performed. No intravenous contrast was administered. COMPARISON:  Lumbar spine MRI 08/31/22 FINDINGS: Segmentation:  Standard. Alignment:  Physiologic. Vertebrae: Postsurgical changes from interval L4-L5 posterior spinal fusion and decompression. There is new marrow edema in the L5 vertebral body, most likely postsurgical. Conus medullaris and cauda equina: Conus extends to the L1-L2 disc space level. Conus and cauda equina appear normal. Paraspinal and other soft tissues: There is a subcutaneous soft tissue fluid collection/hematoma in the lower back measuring 7.7 x 2.1 x 5.2 cm (series 8, image 27). There is edema in the paraspinal musculature surrounding the surgical site. There is a small fluid collection abutting the right transpedicular screw at the L4 vertebral body level (series  8, image 22). This fluid collection causes mass effect on the thecal sac, which is severely narrowed (series 8, image 22). Disc levels: Compared to the preoperative MRI there is decreased thecal sac narrowing of the L4-L5 disc space level (series 8, image 26). There is also decreased narrowing of the thecal sac at the L4 level (series 8, image 24). There is likely increased thecal sac narrowing of the L3-L4 disc space level secondary to the posterior paraspinal fluid collection (series 8, image 22). 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 Postsurgical changes from interval L4-L5 posterior spinal fusion and decompression. There is a small fluid collection abutting the right transpedicular screw at the L4 vertebral body level, which causes mass effect on the thecal sac, which is severely narrowed at the L3-L4 disp space level (increased from prior). 2. There is decreased thecal sac narrowing at the L4-L5 disc space level after decompression. 3. Subcutaneous soft tissue fluid collection/hematoma in the lower back measuring 7.7 x 2.1 x 5.2 cm. 4. Decreased thecal sac narrowing at the L4-L5 and L4 levels. Electronically Signed   By: Lorenza Cambridge M.D.   On: 10/04/2022 12:40     Treatments: analgesia: Dilaudid and Percocet  Discharge Exam: Blood pressure 106/69, pulse 66, temperature 97.9 F (36.6 C), resp. rate 18, height 5\' 10"  (1.778 m), weight 108.3 kg, SpO2 93%.  Alert and oriented x 4 PERRLA CN II-XII grossly intact MAE, Strength and sensation intact Incision is clean, dry, and intact   Disposition: Discharge disposition: [facility]-Another Health Care Institution Not Defined       Discharge Instructions     Call MD for:  difficulty breathing, headache or visual disturbances   Complete by: As directed    Call MD for:  hives  Complete by: As directed    Call MD for:  persistant nausea and vomiting   Complete by: As directed    Call MD for:   redness, tenderness, or signs of infection (pain, swelling, redness, odor or green/yellow discharge around incision site)   Complete by: As directed    Call MD for:  severe uncontrolled pain   Complete by: As directed    Call MD for:  temperature >100.4   Complete by: As directed    Diet - low sodium heart healthy   Complete by: As directed    Increase activity slowly   Complete by: As directed    No dressing needed   Complete by: As directed    No wound care   Complete by: As directed       Allergies as of 10/07/2022   No Known Allergies      Medication List     TAKE these medications    acetaminophen 650 MG CR tablet Commonly known as: TYLENOL Take 1,300 mg by mouth every 8 (eight) hours as needed for pain.   amLODipine-benazepril 5-20 MG capsule Commonly known as: LOTREL Take 1 capsule by mouth at bedtime.   aspirin 81 MG chewable tablet Chew 81 mg by mouth daily.   atorvastatin 20 MG tablet Commonly known as: LIPITOR Take 20 mg by mouth daily.   CENTRUM SILVER ULTRA MENS PO Take 1 tablet by mouth daily with breakfast.   cetirizine 10 MG tablet Commonly known as: ZYRTEC Take 1 tablet (10 mg total) by mouth daily.   cyclobenzaprine 5 MG tablet Commonly known as: FLEXERIL Take 1 tablet (5 mg total) by mouth 3 (three) times daily as needed for muscle spasms.   docusate sodium 100 MG capsule Commonly known as: COLACE Take 1 capsule (100 mg total) by mouth 2 (two) times daily.   empagliflozin 25 MG Tabs tablet Commonly known as: JARDIANCE Take 25 mg by mouth daily.   feeding supplement Liqd Take 237 mLs by mouth 2 (two) times daily between meals.   gabapentin 300 MG capsule Commonly known as: NEURONTIN Take 300 mg by mouth 3 (three) times daily.   Gerhardt's butt cream Crea Apply 1 Application topically 2 (two) times daily.   heparin 5000 UNIT/ML injection Inject 1 mL (5,000 Units total) into the skin every 8 (eight) hours.   hydrochlorothiazide  25 MG tablet Commonly known as: HYDRODIURIL Take 25 mg by mouth daily.   metFORMIN 500 MG 24 hr tablet Commonly known as: GLUCOPHAGE-XR Take 1,000 mg by mouth 2 (two) times daily with a meal.   metoprolol succinate 50 MG 24 hr tablet Commonly known as: TOPROL-XL Take 50 mg by mouth at bedtime. Take with or immediately following a meal.   oxyCODONE-acetaminophen 5-325 MG tablet Commonly known as: Percocet Take 1 tablet by mouth every 4 (four) hours as needed for severe pain.   pioglitazone 30 MG tablet Commonly known as: ACTOS Take 30 mg by mouth at bedtime.   Soliqua 100-33 UNT-MCG/ML Sopn Generic drug: Insulin Glargine-Lixisenatide Inject 30 Units into the skin in the morning and at bedtime.   Testosterone 30 MG/ACT Soln Apply 2 Pump topically daily.               Discharge Care Instructions  (From admission, onward)           Start     Ordered   10/07/22 0000  No dressing needed        10/07/22 1252  Follow-up Information     Tressie Stalker, MD. Go on 10/22/2022.   Specialty: Neurosurgery Why: First post op appointment with x-rays is on 10/23/2022 at 8:30 AM. Contact information: [address] [place] [address] [phone]                 Signed: Val Eagle, DNP, AGNP-C Nurse Practitioner  [facility] [address], [address], [place], [place] [address] P: [phone]    F: [phone]  10/07/2022, 12:54 PM

## 2022-10-07 NOTE — Progress Notes (Signed)
Transition of Care [facility]) - Inpatient Brief Assessment   Patient Details  Name: Dwayne Nelson MRN: [id number] Date of Birth: 1955/09/27  Transition of Care [facility]) CM/SW Contact:    Janae Bridgeman, RN Phone Number: 10/07/2022, 2:29 PM   Clinical Narrative: Patient is being followed by CIR and will admit to the inpatient rehabilitation unit for care today.   Transition of Care Asessment: Insurance and Status: (P) Insurance coverage has been reviewed Patient has primary care physician: (P) Yes Home environment has been reviewed: (P) Yes   Prior/Current Home Services: (P) No current home services Social Determinants of Health Reivew: (P) SDOH reviewed interventions complete Readmission risk has been reviewed: (P) Yes Transition of care needs: (P) transition of care needs identified, TOC will continue to follow

## 2022-10-08 DIAGNOSIS — M4326 Fusion of spine, lumbar region: Secondary | ICD-10-CM | POA: Diagnosis not present

## 2022-10-08 LAB — CBC WITH DIFFERENTIAL/PLATELET
Abs Immature Granulocytes: 0.04 10*3/uL (ref 0.00–0.07)
Basophils Absolute: 0.1 10*3/uL (ref 0.0–0.1)
Basophils Relative: 1 %
Eosinophils Absolute: 0.2 10*3/uL (ref 0.0–0.5)
Eosinophils Relative: 3 %
HCT: 43 % (ref 39.0–52.0)
Hemoglobin: 13.7 g/dL (ref 13.0–17.0)
Immature Granulocytes: 1 %
Lymphocytes Relative: 18 %
Lymphs Abs: 1.2 10*3/uL (ref 0.7–4.0)
MCH: 30.6 pg (ref 26.0–34.0)
MCHC: 31.9 g/dL (ref 30.0–36.0)
MCV: 96 fL (ref 80.0–100.0)
Monocytes Absolute: 0.7 10*3/uL (ref 0.1–1.0)
Monocytes Relative: 11 %
Neutro Abs: 4.5 10*3/uL (ref 1.7–7.7)
Neutrophils Relative %: 66 %
Platelets: 284 10*3/uL (ref 150–400)
RBC: 4.48 MIL/uL (ref 4.22–5.81)
RDW: 12.6 % (ref 11.5–15.5)
WBC: 6.7 10*3/uL (ref 4.0–10.5)
nRBC: 0 % (ref 0.0–0.2)

## 2022-10-08 LAB — COMPREHENSIVE METABOLIC PANEL
ALT: 15 U/L (ref 0–44)
AST: 11 U/L — ABNORMAL LOW (ref 15–41)
Albumin: 3 g/dL — ABNORMAL LOW (ref 3.5–5.0)
Alkaline Phosphatase: 86 U/L (ref 38–126)
Anion gap: 13 (ref 5–15)
BUN: 26 mg/dL — ABNORMAL HIGH (ref 8–23)
CO2: 26 mmol/L (ref 22–32)
Calcium: 9 mg/dL (ref 8.9–10.3)
Chloride: 99 mmol/L (ref 98–111)
Creatinine, Ser: 1.1 mg/dL (ref 0.61–1.24)
GFR, Estimated: >60 mL/min (ref >60)
Glucose, Bld: 87 mg/dL (ref 70–99)
Potassium: 3.5 mmol/L (ref 3.5–5.1)
Sodium: 138 mmol/L (ref 135–145)
Total Bilirubin: 0.3 mg/dL (ref 0.3–1.2)
Total Protein: 6.1 g/dL — ABNORMAL LOW (ref 6.5–8.1)

## 2022-10-08 LAB — GLUCOSE, CAPILLARY
Glucose-Capillary: 92 mg/dL (ref 70–99)
Glucose-Capillary: 103 mg/dL — ABNORMAL HIGH (ref 70–99)
Glucose-Capillary: 123 mg/dL — ABNORMAL HIGH (ref 70–99)
Glucose-Capillary: 108 mg/dL — ABNORMAL HIGH (ref 70–99)

## 2022-10-08 MED ORDER — CHLORHEXIDINE GLUCONATE CLOTH 2 % EX PADS
6.0000 | MEDICATED_PAD | Freq: Every day | CUTANEOUS | Status: AC
  Administered 2022-10-09 – 2022-10-12 (×4): 6 via TOPICAL

## 2022-10-08 MED ORDER — MUPIROCIN 2 % EX OINT
1.0000 | TOPICAL_OINTMENT | Freq: Two times a day (BID) | CUTANEOUS | Status: AC
  Administered 2022-10-08 – 2022-10-13 (×8): 1 via NASAL
  Filled 2022-10-08: qty 22

## 2022-10-08 NOTE — Evaluation (Signed)
Occupational Therapy Assessment and Plan  Patient Details  Name: Dwayne Nelson MRN: [id number] Date of Birth: [birth year]  OT Diagnosis: lumbago (low back pain) and muscle weakness (generalized) Rehab Potential: Rehab Potential (ACUTE ONLY): Good ELOS: 7-10 days   Today's Date: 10/08/2022 OT Individual Time: 4132-4401& 0272-5366 OT Individual Time Calculation (min): 60 min & 67 min  and Today's Date: 10/08/2022 OT Missed Time: -8 Minutes Missed Time Reason: Other (comment) (nursing meeting)    Hospital Problem: Principal Problem:   Lumbar stenosis   Past Medical History:  Past Medical History:  Diagnosis Date   Cancer (HCC)    LPFA skin cancer Dr. Terri Piedra   Diabetes mellitus without complication (HCC)    Hyperlipidemia    Hypertension    Past Surgical History:  Past Surgical History:  Procedure Laterality Date   CIRCUMCISION N/A 03/29/2021   Procedure: DORSAL SLIT CIRCUMCISION ADULT;  Surgeon: Marcine Matar, MD;  Location: [facility];  Service: Urology;  Laterality: N/A;   colonoscopy N/A    NO PAST SURGERIES     SKIN CANCER EXCISION     With Dr Terri Piedra outpatient procedure    Assessment & Plan Clinical Impression:  Dwayne Nelson is a 67 year old male with a history of spondylolisthesis of the lumbar region who is status post lumbar decompression at L3, L4, and L5 with posterior fusion at L4-5 by Dr. Lovell Sheehan on 09/30/2022.  He was discharged home on 10/01/2022.  He presented to the [facility] emergency department on 7/12 with increasing pain not relieved by prescriptive medications.  The patient is accompanied by his son with whom he was staying after discharge.  The patient's states he took the last week off from work to care for his father and is unable to provide adequate care and cannot take further time off from work.  Follow-up imaging demonstrated anticipated postoperative findings with stable appearing stenosis at L3-4 and decreased thecal sac narrowing L4-5.   Moderate stenosis noted.  Neurosurgery admitted the patient and felt he would improve with continued time, therapy and would pursue inpatient rehabilitation versus skilled nursing facility placement.  Blood work reveals normal CBC and slight elevation of blood glucose.  Multiple agents for diabetes mellitus and hypertension restarted.  Vital signs are stable.  He is tolerating his diet.The patient requires inpatient medicine and rehabilitation evaluations and services for ongoing dysfunction secondary to spondylolisthesis of the lumbar region status post lumbar decompression.   Patient gives a history of fall in a store last February resulting in back injury.  Initial evaluation with CT scan in the emergency department revealed superior endplate compression fracture of L4 vertebral body with mild retropulsion as well as nondisplaced fracture of both L4 pedicles.  Patient was admitted and then discharged to a skilled nursing facility.  At that time no surgery was advised.  Patient followed up with neurosurgery who performed flexion-extension views of the lumbar spine and found instability.  Patient was scheduled for elective lumbar decompression and fusion   Occasional incontinence of bowel but having bowel movements.  No urinary retention noted.  Continent of urine..  Patient transferred to CIR on 10/07/2022 .    Patient currently requires  CGA  with basic self-care skills secondary to muscle weakness, decreased cardiorespiratoy endurance, and decreased sitting balance, decreased standing balance, and decreased balance strategies.  Prior to hospitalization, patient could complete ADLs  with modified independent .  Patient will benefit from skilled intervention to decrease level of assist with basic self-care skills and increase  independence with basic self-care skills prior to discharge home with care partner.  Anticipate patient will require intermittent supervision and follow up home health.  OT - End of  Session Activity Tolerance: Tolerates 30+ min activity with multiple rests Endurance Deficit: Yes Endurance Deficit Description: decreased OT Assessment Rehab Potential (ACUTE ONLY): Good OT Patient demonstrates impairments in the following area(s): Balance;Endurance OT Basic ADL's Functional Problem(s): Eating;Grooming;Bathing;Dressing;Toileting OT Transfers Functional Problem(s): Toilet;Tub/Shower OT Plan OT Intensity: Minimum of 1-2 x/day, 45 to 90 minutes OT Frequency: 5 out of 7 days OT Duration/Estimated Length of Stay: 7-10 days OT Treatment/Interventions: Balance/vestibular training;Discharge planning;Self Care/advanced ADL retraining;Therapeutic Activities;Pain management;UE/LE Coordination activities;Functional mobility training;Patient/family education;Therapeutic Exercise;DME/adaptive equipment instruction;UE/LE Strength taining/ROM OT Self Feeding Anticipated Outcome(s): Mod I OT Basic Self-Care Anticipated Outcome(s): Mod I OT Toileting Anticipated Outcome(s): Mod I OT Bathroom Transfers Anticipated Outcome(s): Mod I OT Recommendation Recommendations for Other Services: Therapeutic Recreation consult Therapeutic Recreation Interventions: Pet therapy;Outing/community reintergration Patient destination: Home Follow Up Recommendations: Home health OT Equipment Recommended: To be determined Equipment Details: Pt owns 3-1 commode, W/C, RW, grab bars in shower, TTB   OT Evaluation Precautions/Restrictions  Precautions Precautions: Fall;Back Required Braces or Orthoses: Spinal Brace Spinal Brace: Lumbar corset;Applied in sitting position Restrictions Weight Bearing Restrictions: No General Chart Reviewed: Yes Family/Caregiver Present: No Vital Signs Therapy Vitals Temp: 98 F (36.7 C) Pulse Rate: 66 Resp: 16 BP: 98/64 Patient Position (if appropriate): Lying Oxygen Therapy SpO2: 94 % O2 Device: Room Air Pain Pain Assessment Pain Score: 2  Pain Location: Rib  cage Pain Onset: On-going Multiple Pain Sites: No Home Living/Prior Functioning Home Living Living Arrangements: Children Available Help at Discharge: Family, Available PRN/intermittently Type of Home: House Home Access: Stairs to enter Secretary/administrator of Steps: 2 Entrance Stairs-Rails: None Home Layout: One level Bathroom Shower/Tub: Psychologist, counselling, Engineer, manufacturing systems: Handicapped height Bathroom Accessibility: Yes Additional Comments: Following discharge, will live at [facility] house for a week.  Lives With: Son IADL History Homemaking Responsibilities: No Current License: Yes Occupation: Retired Advertising account planner: grandchildren, fishing Prior Function Level of Independence: Requires assistive device for independence, Independent with basic ADLs  Able to Take Stairs?: Yes Driving: Yes Vocation: Retired Administrator, sports Baseline Vision/History: 1 Wears glasses Ability to See in Adequate Light: 0 Adequate Patient Visual Report: No change from baseline Vision Assessment?: Yes Eye Alignment: Within Functional Limits Convergence: Within functional limits Perception  Perception: Within Functional Limits Praxis Praxis: Intact Cognition Cognition Overall Cognitive Status: Within Functional Limits for tasks assessed Arousal/Alertness: Awake/alert Memory: Appears intact Awareness: Appears intact Problem Solving: Appears intact Safety/Judgment: Appears intact Brief Interview for Mental Status (BIMS) Repetition of Three Words (First Attempt): 3 Temporal Orientation: Year: Correct Temporal Orientation: Month: Accurate within 5 days Temporal Orientation: Day: Correct Recall: "Sock": Yes, no cue required Recall: "Blue": Yes, no cue required Recall: "Bed": Yes, after cueing ("a piece of furniture") BIMS Summary Score: 14 Sensation Sensation Light Touch: Appears Intact Proprioception: Appears Intact Stereognosis: Not tested Coordination Gross Motor Movements are  Fluid and Coordinated: No Fine Motor Movements are Fluid and Coordinated: Yes Coordination and Movement Description: grossly uncoordinated and limited due to pain and decreased activity tolerance Finger Nose Finger Test: [facility] Heel Shin Test: [facility] Motor  Motor Motor: Other (comment) Motor - Skilled Clinical Observations: Grossly uncoordinated with activity tolerance deficits  Trunk/Postural Assessment  Cervical Assessment Cervical Assessment: Within Functional Limits Thoracic Assessment Thoracic Assessment: Within Functional Limits Lumbar Assessment Lumbar Assessment: Exceptions to [facility] (lumber corset) Postural Control Postural Control: Within  Functional Limits  Balance Balance Balance Assessed: Yes Static Sitting Balance Static Sitting - Balance Support: Feet supported Static Sitting - Level of Assistance: 6: Modified independent (Device/Increase time) Dynamic Sitting Balance Dynamic Sitting - Balance Support: Bilateral upper extremity supported Dynamic Sitting - Level of Assistance: 6: Modified independent (Device/Increase time) Sitting balance - Comments: Able to sit EOB and donn spinal brace without UE support. Static Standing Balance Static Standing - Balance Support: Bilateral upper extremity supported Static Standing - Level of Assistance: 5: Stand by assistance Dynamic Standing Balance Dynamic Standing - Balance Support: Bilateral upper extremity supported Dynamic Standing - Level of Assistance: 5: Stand by assistance Dynamic Standing - Balance Activities: Reaching for objects Extremity/Trunk Assessment RUE Assessment RUE Assessment: Within Functional Limits Active Range of Motion (AROM) Comments: WFL General Strength Comments: 4/5 proximal, 4+/5 distal LUE Assessment LUE Assessment: Within Functional Limits Active Range of Motion (AROM) Comments: WFL General Strength Comments: 4/5 proximal, 4+/5 distal  Care Tool Care Tool Self Care Eating   Eating Assist Level:  Set up assist    Oral Care    Oral Care Assist Level: Set up assist    Bathing   Body parts bathed by patient: Right arm;Right lower leg;Left lower leg;Left arm;Chest;Face;Abdomen;Front perineal area;Buttocks;Right upper leg;Left upper leg     Assist Level: Contact Guard/Touching assist    Upper Body Dressing(including orthotics)   What is the patient wearing?: Pull over shirt   Assist Level: Supervision/Verbal cueing    Lower Body Dressing (excluding footwear)   What is the patient wearing?: Underwear/pull up;Pants Assist for lower body dressing: Contact Guard/Touching assist    Putting on/Taking off footwear   What is the patient wearing?: Socks Assist for footwear: Total Assistance - Patient < 25%       Care Tool Toileting Toileting activity   Assist for toileting: Contact Guard/Touching assist     Care Tool Bed Mobility Roll left and right activity   Roll left and right assist level: Supervision/Verbal cueing    Sit to lying activity   Sit to lying assist level: Supervision/Verbal cueing    Lying to sitting on side of bed activity   Lying to sitting on side of bed assist level: the ability to move from lying on the back to sitting on the side of the bed with no back support.: Supervision/Verbal cueing     Care Tool Transfers Sit to stand transfer   Sit to stand assist level: Contact Guard/Touching assist    Chair/bed transfer   Chair/bed transfer assist level: Contact Guard/Touching assist     Toilet transfer   Assist Level: Contact Guard/Touching assist     Care Tool Cognition  Expression of Ideas and Wants Expression of Ideas and Wants: 4. Without difficulty (complex and basic) - expresses complex messages without difficulty and with speech that is clear and easy to understand  Understanding Verbal and Non-Verbal Content Understanding Verbal and Non-Verbal Content: 4. Understands (complex and basic) - clear comprehension without cues or repetitions    Memory/Recall Ability Memory/Recall Ability : Current season;Location of own room;Staff names and faces;That he or she is in a hospital/hospital unit   Refer to Care Plan for Long Term Goals  SHORT TERM GOAL WEEK 1 OT Short Term Goal 1 (Week 1): STG=LTG d/t ELOS  Recommendations for other services: Therapeutic Recreation  Pet therapy and Outing/community reintegration   Skilled Therapeutic Intervention ADL ADL Equipment Provided: Long-handled sponge Eating: Set up Grooming: Supervision/safety Where Assessed-Grooming: Sitting at sink Upper Body  Bathing: Supervision/safety Where Assessed-Upper Body Bathing: Shower Lower Body Bathing: Supervision/safety (long handled sponge) Where Assessed-Lower Body Bathing: Shower Upper Body Dressing: Supervision/safety Where Assessed-Upper Body Dressing: Edge of bed Lower Body Dressing: Contact guard Where Assessed-Lower Body Dressing: Edge of bed;Standing at sink Toileting: Contact guard Where Assessed-Toileting: Bedside Commode (bariatric commode over toilet) Toilet Transfer: Furniture conservator/restorer Method: Stand pivot Acupuncturist: Extra wide IT sales professional: Insurance underwriter: Administrator, arts Method: Designer, industrial/product: Transfer tub bench;Grab bars Mobility  Bed Mobility Bed Mobility: Rolling Right;Rolling Left;Sit to Supine;Supine to Sit Rolling Right: Supervision/verbal cueing Rolling Left: Supervision/Verbal cueing Supine to Sit: Supervision/Verbal cueing Sit to Supine: Supervision/Verbal cueing Transfers Sit to Stand: Contact Guard/Touching assist Stand to Sit: Contact Guard/Touching assist   Session 1 "Hello there!" Pt supine in bed upon OT arrival, agreeable to OT session.  1:1 OT evaluation/treatment initiated with pt. Pt educated on OT roles, purpose and goals for therapy as well as therapy schedule. OT issued ROHO  cushion for W/C. OT issued long handled sponge. Pt bathed at sink level d/t time constraints. OT educated pt on back precautions and reiteration on donning lumbar corset. Pt would benefit from skilled OT services in IPR setting to maximize independence with ADLs and IADLs in preparation for D/C.    Pt supine in bed with bed alarm activated, 2 bed rails up, call light within reach and 4Ps assessed.  Session 2  "Can I have a real shower?" Pt supine in bed upon OT arrival, agreeable to OT session.  Bed mobility: SBA UB dressing: SBA for donning doffing overhead shirt and lumbar corset LB dressing: CGA overall for stabilization when standing at RW to manage pants over waist for donning/doffing Footwear: total A d/t back precautions Shower transfer: CGA with RW ambulating ~15 feet from bed><TTB Bathing: back incision covered for shower, CGA overall d/t standing to wash buttocks, pt used long handled sponge and removable shower head during shower Transfers: CGA overall with RW, no LOB/SOB   Pt issued UE theraband HEP in order to increase functional strength, andurance and activity tolerance in order to increase independence in ADLs such as bathing. Pt issued res and green theraband and discussed direction/technique of exercises, demonstrating verbal understanding. Pt completed 3x10 exercises at bed level listed below: -elbow extensions - shoulder horizontal abduction -bicep curls  -shoulder flexion -diagonal shoulder flexion -external rotation  Pt supine in bed with bed alarm activated, 2 bed rails up, call light within reach and 4Ps assessed.   Discharge Criteria: Patient will be discharged from OT if patient refuses treatment 3 consecutive times without medical reason, if treatment goals not met, if there is a change in medical status, if patient makes no progress towards goals or if patient is discharged from hospital.  The above assessment, treatment plan, treatment alternatives and  goals were discussed and mutually agreed upon: by patient  Velia Meyer, OTD, OTR/L 10/08/2022, 3:56 PM

## 2022-10-08 NOTE — Discharge Instructions (Addendum)
Inpatient Rehab Discharge Instructions  Dwayne Nelson Discharge date and time: 10/14/2022  Activities/Precautions/ Functional Status: Activity: no lifting, driving, or strenuous exercise until cleared by MD Diet: diabetic diet Wound Care: keep wound clean and dry Functional status:  ___ No restrictions     ___ Walk up steps independently ___ 24/7 supervision/assistance   ___ Walk up steps with assistance __x_ Intermittent supervision/assistance  ___ Bathe/dress independently ___ Walk with walker     ___ Bathe/dress with assistance ___ Walk Independently    ___ Shower independently ___ Walk with assistance    __x_ Shower with assistance ___ No alcohol     ___ Return to work/school ________  Special Instructions: No driving, alcohol consumption or tobacco use.  Recommend checking fingerstick blood sugars four (4) times daily and record or resume your continuous blood glucose monitor. Bring this information with you to follow-up appointment with PCP.  Recommend daily BP measurement in same arm and record time of day. Bring this information with you to follow-up appointment with PCP.  COMMUNITY REFERRALS UPON DISCHARGE:    Home Health:   PT  &  RN                 Agency:[facility] Phone:[phone]   Medical Equipment/Items Ordered:HAS ALL EQUIPMENT FROM PREVIOUS ADMISSION-WILL GET ROLLATOR ON OWN DUE TO NOT COVERED.                                                 Agency/Supplier:NA    My questions have been answered and I understand these instructions. I will adhere to these goals and the provided educational materials after my discharge from the hospital.  Patient/Caregiver Signature _______________________________ Date __________  Clinician Signature _______________________________________ Date __________  Please bring this form and your medication list with you to all your follow-up doctor's appointments.

## 2022-10-08 NOTE — Progress Notes (Signed)
PROGRESS NOTE   Subjective/Complaints:  Pt reports pain is good right now- 2-3/10- but was "100/10" with therapy last because hadn't had pain meds.  Little groggy- just waking up.  LBM x2 yesterday Wanted to let me know has lost 50+ lbs since February when had accident that he required this surgery for- doesn't have great appetite.     Objective:   No results found. Recent Labs    10/08/22 0447  WBC 6.7  HGB 13.7  HCT 43.0  PLT 284   Recent Labs    10/08/22 0447  NA 138  K 3.5  CL 99  CO2 26  GLUCOSE 87  BUN 26*  CREATININE 1.10  CALCIUM 9.0    Intake/Output Summary (Last 24 hours) at 10/08/2022 0916 Last data filed at 10/07/2022 2100 Gross per 24 hour  Intake --  Output 200 ml  Net -200 ml        Physical Exam: Vital Signs Blood pressure 118/71, pulse 64, temperature 98.6 F (37 C), resp. rate 18, height 5\' 10"  (1.778 m), weight 105.7 kg, SpO2 96%.   General: awake, alert, appropriate, sitting up in bed; does look like just woke up;  NAD HENT: conjugate gaze; oropharynx moist CV: regular rate and rhythm; no JVD Pulmonary: CTA B/L; no W/R/R- good air movement GI: soft, NT, ND, (+)BS Psychiatric: appropriate- joking some Neurological: Ox3 Skin: No evidence of breakdown, no evidence of rash, lumbar incision clean dry well-approximated no drainage induration or fluctuance Neurologic: Alert and oriented x 3 motor strength is 5/5 in bilateral deltoid, bicep, tricep, grip, 4/5 bilateral hip flexor, knee extensors, ankle dorsiflexor and plantar flexor Sensory exam normal sensation to light touch bilateral upper and lower extremities Tone is normal in bilateral lower extremities Musculoskeletal: Full range of motion in all 4 extremities. No joint swelling  Assessment/Plan: 1. Functional deficits which require 3+ hours per day of interdisciplinary therapy in a comprehensive inpatient rehab  setting. Physiatrist is providing close team supervision and 24 hour management of active medical problems listed below. Physiatrist and rehab team continue to assess barriers to discharge/monitor patient progress toward functional and medical goals  Care Tool:  Bathing              Bathing assist       Upper Body Dressing/Undressing Upper body dressing        Upper body assist      Lower Body Dressing/Undressing Lower body dressing            Lower body assist       Toileting Toileting    Toileting assist       Transfers Chair/bed transfer  Transfers assist           Locomotion Ambulation   Ambulation assist              Walk 10 feet activity   Assist           Walk 50 feet activity   Assist           Walk 150 feet activity   Assist           Walk 10 feet on uneven surface  activity   Assist           Wheelchair     Assist               Wheelchair 50 feet with 2 turns activity    Assist            Wheelchair 150 feet activity     Assist          Blood pressure 118/71, pulse 64, temperature 98.6 F (37 C), resp. rate 18, height 5\' 10"  (1.778 m), weight 105.7 kg, SpO2 96%.  Medical Problem List and Plan: 1. Functional deficits secondary to lumbar spondylolisthesis with lumbar stenosis              -patient may  shower             -ELOS/Goals: 7-10d, Mod I   Admit to CIR- first day of evaluation- con't CIR PT and OT  Team conference today to determine length of stay 2.  Antithrombotics: -DVT/anticoagulation:  Pharmaceutical: Heparin             -antiplatelet therapy: Aspirin 81 mg daily   3. Pain Management: Tylenol as needed             -Continue gabapentin 300 mg 3 times daily   7/16- also has Percocet q4 hours- says brings pain down to 2-3/10 with rest- asked pt to ask for pain meds prior to therapy.  4. Mood/Behavior/Sleep: LCSW to evaluate and provide emotional  support             -antipsychotic agents: n/a   5. Neuropsych/cognition: This patient is capable of making decisions on his own behalf.   6. Skin/Wound Care: Routine skin care checks   7/16- incision looks OK- con't regimen 7. Fluids/Electrolytes/Nutrition: Routine Is and Os and follow-up chemistries   8: Hypertension: monitor TID and prn             -Continue amlodipine 5 mg daily             -Continue metoprolol succinate 50 mg nightly             -Continue benazepril 20 mg daily             -Continue hydrochlorothiazide 25 mg daily   7/16- BP controlled- con't regimen 9: Hyperlipidemia: continue statin   10: DM: CBGs 4 times daily; A1c = 7.5% (home = Jardiance, Soliqua,metformin, Actos)             -Continue Jardiance 25 mg daily             -Continue Semglee 30 units twice daily             -Continue metformin 1000 mg twice daily             -Continue Actos 30 mg nightly   7/16- CBGs somewhat labile- 92-242 in last 24 hours- con't regimen and monitor trend to see if needs to make changes.  11. Azotemia  7/16- BUN 26- will encourage fluids and recheck Thursday  I spent a total of 51    minutes on total care today- >50% coordination of care- due to  Learning pt- review of chart- review of labs-   And team conference to determine length of stay   LOS: 1 days A FACE TO FACE EVALUATION WAS PERFORMED  Corrinne Benegas 10/08/2022, 9:16 AM

## 2022-10-08 NOTE — Progress Notes (Signed)
Inpatient Rehabilitation Center Individual Statement of Services  Patient Name:  Dwayne Nelson  Date:  10/08/2022  Welcome to the Inpatient Rehabilitation Center.  Our goal is to provide you with an individualized program based on your diagnosis and situation, designed to meet your specific needs.  With this comprehensive rehabilitation program, you will be expected to participate in at least 3 hours of rehabilitation therapies Monday-Friday, with modified therapy programming on the weekends.  Your rehabilitation program will include the following services:  Physical Therapy (PT), Occupational Therapy (OT), 24 hour per day rehabilitation nursing, Therapeutic Recreaction (TR), Care Coordinator, Rehabilitation Medicine, Nutrition Services, and Pharmacy Services  Weekly team conferences will be held on Tuesday to discuss your progress.  Your Inpatient Rehabilitation Care Coordinator will talk with you frequently to get your input and to update you on team discussions.  Team conferences with you and your family in attendance may also be held.  Expected length of stay: 7 Days  Overall anticipated outcome: mod/I level  Depending on your progress and recovery, your program may change. Your Inpatient Rehabilitation Care Coordinator will coordinate services and will keep you informed of any changes. Your Inpatient Rehabilitation Care Coordinator's name and contact numbers are listed  below.  The following services may also be recommended but are not provided by the Inpatient Rehabilitation Center:  Driving Evaluations Home Health Rehabiltiation Services Outpatient Rehabilitation Services    Arrangements will be made to provide these services after discharge if needed.  Arrangements include referral to agencies that provide these services.  Your insurance has been verified to be:  [employer] Your primary doctor is:  Johny Blamer  Pertinent information will be shared with your doctor  and your insurance company.  Inpatient Rehabilitation Care Coordinator:  Dossie Der, [place] [phone] or Luna Glasgow  Information discussed with and copy given to patient by: Lucy Chris, 10/08/2022, 10:16 AM

## 2022-10-08 NOTE — Progress Notes (Signed)
Inpatient Rehabilitation Care Coordinator Assessment and Plan Patient Details  Name: Dwayne Nelson MRN: [id number] Date of Birth: 1955-12-08  Today's Date: 10/08/2022  Hospital Problems: Principal Problem:   Lumbar stenosis  Past Medical History:  Past Medical History:  Diagnosis Date   Cancer [facility])    LPFA skin cancer Dr. Terri Piedra   Diabetes mellitus without complication (HCC)    Hyperlipidemia    Hypertension    Past Surgical History:  Past Surgical History:  Procedure Laterality Date   CIRCUMCISION N/A 03/29/2021   Procedure: DORSAL SLIT CIRCUMCISION ADULT;  Surgeon: Marcine Matar, MD;  Location: [facility];  Service: Urology;  Laterality: N/A;   colonoscopy N/A    NO PAST SURGERIES     SKIN CANCER EXCISION     With Dr Terri Piedra outpatient procedure   Social History:  reports that he has never smoked. He has never used smokeless tobacco. He reports that he does not currently use alcohol. He reports that he does not use drugs.  Family / Support Systems Marital Status: Widow/Widower Patient Roles: Parent, Other (Comment) (grandpa) Children: grenz [phone] Other Supports: Daughter in-law and 43 yo grandson Anticipated Caregiver: Son and daughter in-law Ability/Limitations of Caregiver: Work during the day are home at night Caregiver Availability: Evenings only Family Dynamics: Close with son and his family has been staying with him since falling in March. He has friends and neighbors who would check in on him.  Social History Preferred language: English Religion:  Cultural Background: No issues Education: HS Health Literacy - How often do you need to have someone help you when you read instructions, pamphlets, or other written material from your doctor or pharmacy?: Never Writes: Yes Employment Status: Retired Marine scientist Issues: No issues Guardian/Conservator: None-according to MD pt is capable of making his own decisions while  here   Abuse/Neglect Abuse/Neglect Assessment Can Be Completed: Yes Physical Abuse: Denies Verbal Abuse: Denies Sexual Abuse: Denies Exploitation of patient/patient's resources: Denies Self-Neglect: Denies  Patient response to: Social Isolation - How often do you feel lonely or isolated from those around you?: Never  Emotional Status Pt's affect, behavior and adjustment status: Pt is hopeful now since he has had surgery he can recover and not linger and be in pain. He has always been independent and taken care of himself. He plans to get back to this level Recent Psychosocial Issues: back issues since fall n 05/2022 and now has had surgery Psychiatric History: No issues/history seems to be coping appropriately and able to verbalize his feelings and thoughts Substance Abuse History: No issues  Patient / Family Perceptions, Expectations & Goals Pt/Family understanding of illness & functional limitations: Pt is able to explain his back surgery and is hopful now he can recover since he was not prior to admission. He does talk with his surgeon and feels knows what is ahead for him Premorbid pt/family roles/activities: Father, retiree, grandfather, home owner, etc Anticipated changes in roles/activities/participation: resume Pt/family expectations/goals: Pt states: " I hope to be moving better and independent I was before but had pain but was still driving."  [employer]: None Premorbid Home Care/DME Agencies: Other (Comment) (rw, tub seat, wc, elevated toliet) Transportation available at discharge: self and son Is the patient able to respond to transportation needs?: Yes In the past 12 months, has lack of transportation kept you from medical appointments or from getting medications?: No In the past 12 months, has lack of transportation kept you from meetings, work, or  from getting things needed for daily living?: No  Discharge Planning Living Arrangements:  Children Support Systems: Children, Other relatives, Friends/neighbors Type of Residence: Private residence Insurance Resources: [employer], Media planner (specify) ([employer]) Financial Resources: Restaurant manager, fast food Screen Referred: No Living Expenses: Lives with family Money Management: Patient Does the patient have any problems obtaining your medications?: No Home Management: self and son's family Patient/Family Preliminary Plans: Plans to return to son's home where he has been staying since March 2024 after he fell. He was having fun with his 65 yo grandson hanging out. Aware being evlauted today and goals being set for stay. Will update once evaluations complete Care Coordinator Anticipated Follow Up Needs: HH/OP  Clinical Impression Pleasant gentleman who is now able to recover since had surgery he needed back in March when he fell. He plans to go back to his son's home and recover. There alone during the day while both son and daughter in-law work, but hangs out with his 13 yo grandson. Work on discharge needs.   Lucy Chris 10/08/2022, 10:15 AM

## 2022-10-08 NOTE — Plan of Care (Signed)
  Problem: RH Eating Goal: LTG Patient will perform eating w/assist, cues/equip (OT) Description: LTG: Patient will perform eating with assist, with/without cues using equipment (OT) Flowsheets (Taken 10/08/2022 1400) LTG: Pt will perform eating with assistance level of: Independent   Problem: RH Grooming Goal: LTG Patient will perform grooming w/assist,cues/equip (OT) Description: LTG: Patient will perform grooming with assist, with/without cues using equipment (OT) Flowsheets (Taken 10/08/2022 1400) LTG: Pt will perform grooming with assistance level of: Independent with assistive device    Problem: RH Bathing Goal: LTG Patient will bathe all body parts with assist levels (OT) Description: LTG: Patient will bathe all body parts with assist levels (OT) Flowsheets (Taken 10/08/2022 1400) LTG: Pt will perform bathing with assistance level/cueing: Independent with assistive device    Problem: RH Dressing Goal: LTG Patient will perform upper body dressing (OT) Description: LTG Patient will perform upper body dressing with assist, with/without cues (OT). Flowsheets (Taken 10/08/2022 1400) LTG: Pt will perform upper body dressing with assistance level of: Independent Goal: LTG Patient will perform lower body dressing w/assist (OT) Description: LTG: Patient will perform lower body dressing with assist, with/without cues in positioning using equipment (OT) Flowsheets (Taken 10/08/2022 1400) LTG: Pt will perform lower body dressing with assistance level of: Independent with assistive device   Problem: RH Toileting Goal: LTG Patient will perform toileting task (3/3 steps) with assistance level (OT) Description: LTG: Patient will perform toileting task (3/3 steps) with assistance level (OT)  Flowsheets (Taken 10/08/2022 1400) LTG: Pt will perform toileting task (3/3 steps) with assistance level: Independent with assistive device   Problem: RH Toilet Transfers Goal: LTG Patient will perform toilet  transfers w/assist (OT) Description: LTG: Patient will perform toilet transfers with assist, with/without cues using equipment (OT) Flowsheets (Taken 10/08/2022 1400) LTG: Pt will perform toilet transfers with assistance level of: Independent with assistive device   Problem: RH Tub/Shower Transfers Goal: LTG Patient will perform tub/shower transfers w/assist (OT) Description: LTG: Patient will perform tub/shower transfers with assist, with/without cues using equipment (OT) Flowsheets (Taken 10/08/2022 1400) LTG: Pt will perform tub/shower stall transfers with assistance level of: Independent with assistive device

## 2022-10-08 NOTE — Discharge Summary (Signed)
Physician Discharge Summary  Patient ID: Dwayne Nelson MRN: [id number] DOB/AGE: 08-27-1955 67 y.o.  Admit date: 10/07/2022 Discharge date: 10/14/2022  Discharge Diagnoses:  Principal Problem:   Fusion of lumbar spine Active problems: Functional deficits secondary to lumbar spondylolisthesis with lumbar stenosis Hypertension Hyperlipidemia Diabetes mellitus  Discharged Condition: good  Significant Diagnostic Studies: none  Labs:  Basic Metabolic Panel: Recent Labs  Lab 10/08/22 0447 10/10/22 0640 10/14/22 0656  NA 138 132* 134*  K 3.5 3.5 4.2  CL 99 98 98  CO2 26 27 29   GLUCOSE 87 67* 130*  BUN 26* 22 29*  CREATININE 1.10 1.05 1.19  CALCIUM 9.0 8.8* 9.0    CBC: Recent Labs  Lab 10/08/22 0447 10/14/22 0656  WBC 6.7 6.3  NEUTROABS 4.5  --   HGB 13.7 13.8  HCT 43.0 42.5  MCV 96.0 94.4  PLT 284 267    CBG: Recent Labs  Lab 10/13/22 1136 10/13/22 1615 10/13/22 2138 10/14/22 0622 10/14/22 1133  GLUCAP 131* 182* 137* 130* 118*    Brief HPI:   Dwayne Nelson is a 67 y.o. male  with a history of spondylolisthesis of the lumbar region who is status post lumbar decompression at L3, L4, and L5 with posterior fusion at L4-5 by Dr. Lovell Sheehan on 09/30/2022.  He was discharged home on 10/01/2022.  He presented to the [facility] emergency department on 7/12 with increasing pain not relieved by prescriptive medications.  The patient is accompanied by his son with whom he was staying after discharge.  The patient's states he took the last week off from work to care for his father and is unable to provide adequate care and cannot take further time off from work.  Follow-up imaging demonstrated anticipated postoperative findings with stable appearing stenosis at L3-4 and decreased thecal sac narrowing L4-5.  Moderate stenosis noted.  Neurosurgery admitted the patient and felt he would improve with continued time, therapy and would pursue inpatient rehabilitation versus skilled nursing  facility placement.  Blood work reveals normal CBC and slight elevation of blood glucose.  Multiple agents for diabetes mellitus and hypertension restarted.  Vital signs are stable.  He is tolerating his diet.The patient requires inpatient medicine and rehabilitation evaluations and services for ongoing dysfunction secondary to spondylolisthesis of the lumbar region status post lumbar decompression.   Patient gives a history of fall in a store last February resulting in back injury.  Initial evaluation with CT scan in the emergency department revealed superior endplate compression fracture of L4 vertebral body with mild retropulsion as well as nondisplaced fracture of both L4 pedicles.  Patient was admitted and then discharged to a skilled nursing facility.  At that time no surgery was advised.  Patient followed up with neurosurgery who performed flexion-extension views of the lumbar spine and found instability.  Patient was scheduled for elective lumbar decompression and fusion.   Occasional incontinence of bowel but having bowel movements.  No urinary retention noted.  Continent of urine.   Hospital Course: Dwayne Nelson was admitted to rehab 10/07/2022 for inpatient therapies to consist of PT, ST and OT at least three hours five days a week. Past admission physiatrist, therapy team and rehab RN have worked together to provide customized collaborative inpatient rehab.  Follow-up labs 7/16 with BUN of 26 and oral fluids encouraged. Pain control and issue and added tramadol 50 q 6 hours. Bleeding from incision noted on 7/17. This was consistent with old hematoma and resolved spontaneously.   Robaxin  effective for spasms in buttocks. Increased Robaxin to 1000 mg QID. Myofascila release and trigger point injections done 7/19.   Blood pressures were monitored on TID basis and amlodipine 5 mg daily, metoprolol succinate 50 mg nightly, benazepril 20 mg daily and hydrochlorothiazide 25 mg daily  continued.  Diabetes has been monitored with ac/hs CBG checks and Jardiance 25 mg daily, Semglee 30 units twice daily, metformin 1000 mg twice daily and Actos 30 mg nightly continued. Hypoglycemic event 7/17>>Jardiance held and Semglee reduced to 15 units that morning and change regimen to Semglee 25 units BID. Semglee reduced further to 20 units BID. Return to [facility] 20 units BID at home after discharge.    Rehab course: During patient's stay in rehab weekly team conferences were held to monitor patient's progress, set goals and discuss barriers to discharge. At admission, patient required  CGA  with mobility and with basic self care skills.  He has had improvement in activity tolerance, balance, postural control as well as ability to compensate for deficits. He has had improvement in functional use RUE/LUE  and RLE/LLE as well as improvement in awareness.   Discharge disposition: 01-Home or Self Care     Diet: carb modified  Special Instructions: No driving, alcohol consumption or tobacco use.  30-35 minutes were spent on discharge planning and discharge summary.  Discharge Instructions     Ambulatory referral to Physical Medicine Rehab   Complete by: As directed    Hospital follow-up   Discharge patient   Complete by: As directed    Discharge disposition: 01-Home or Self Care   Discharge patient date: 10/14/2022      Allergies as of 10/14/2022   No Known Allergies      Medication List     STOP taking these medications    acetaminophen 650 MG CR tablet Commonly known as: TYLENOL Replaced by: acetaminophen 325 MG tablet   cyclobenzaprine 5 MG tablet Commonly known as: FLEXERIL   feeding supplement Liqd   heparin 5000 UNIT/ML injection   pioglitazone 30 MG tablet Commonly known as: ACTOS       TAKE these medications    acetaminophen 325 MG tablet Commonly known as: TYLENOL Take 1-2 tablets (325-650 mg total) by mouth every 4 (four) hours as needed for mild  pain. Replaces: acetaminophen 650 MG CR tablet   amLODipine-benazepril 5-20 MG capsule Commonly known as: LOTREL Take 1 capsule by mouth at bedtime.   aspirin 81 MG chewable tablet Chew 81 mg by mouth daily.   atorvastatin 20 MG tablet Commonly known as: LIPITOR Take 20 mg by mouth daily.   Baclofen 5 MG Tabs Take 1 tablet (5 mg total) by mouth 3 (three) times daily as needed for muscle spasms.   CENTRUM SILVER ULTRA MENS PO Take 1 tablet by mouth daily with breakfast.   cetirizine 10 MG tablet Commonly known as: ZYRTEC Take 1 tablet (10 mg total) by mouth daily.   docusate sodium 100 MG capsule Commonly known as: COLACE Take 1 capsule (100 mg total) by mouth 2 (two) times daily.   empagliflozin 25 MG Tabs tablet Commonly known as: JARDIANCE Take 25 mg by mouth daily.   gabapentin 300 MG capsule Commonly known as: NEURONTIN Take 1 capsule (300 mg total) by mouth 3 (three) times daily.   Gerhardt's butt cream Crea Apply 1 Application topically 2 (two) times daily.   hydrochlorothiazide 25 MG tablet Commonly known as: HYDRODIURIL Take 25 mg by mouth daily.   metFORMIN 500 MG  24 hr tablet Commonly known as: GLUCOPHAGE-XR Take 1,000 mg by mouth 2 (two) times daily with a meal.   methocarbamol 500 MG tablet Commonly known as: ROBAXIN Take 2 tablets (1,000 mg total) by mouth 4 (four) times daily.   metoprolol succinate 50 MG 24 hr tablet Commonly known as: TOPROL-XL Take 50 mg by mouth at bedtime. Take with or immediately following a meal.   oxyCODONE-acetaminophen 5-325 MG tablet Commonly known as: Percocet Take 1 tablet by mouth 2 (two) times daily as needed for severe pain. What changed: when to take this   Soliqua 100-33 UNT-MCG/ML Sopn Generic drug: Insulin Glargine-Lixisenatide Inject 20 Units into the skin in the morning and at bedtime. What changed: how much to take   Testosterone 30 MG/ACT Soln Apply 2 Pump topically daily.        Follow-up  Information     Noberto Retort, MD Follow up.   Specialty: Family Medicine Why: Call the office in 1-2 days to make arrangements for hospital follow-up appointment. Contact information: [address] [place] [address] [phone]         Genice Rouge, MD Follow up.   Specialty: Physical Medicine and Rehabilitation Why: office will call you to arrange your appt (sent) Contact information: [address] [place] [address] [phone]         Tressie Stalker, MD Follow up.   Specialty: Neurosurgery Why: Call the office in 1-2 days to make arrangements for hospital follow-up appointment. Contact information: [address] [place] [address] [phone]                 Signed: Milinda Antis 10/14/2022, 3:27 PM

## 2022-10-08 NOTE — Patient Care Conference (Signed)
Inpatient RehabilitationTeam Conference and Plan of Care Update Date: 10/09/2022   Time: 11:47 AM    Patient Name: Dwayne Nelson      Medical Record Number: [id number]  Date of Birth: 02/19/1956 Sex: Male         Room/Bed: 4W10C/4W10C-01 Payor Info: Payor: MEDICARE / Plan: MEDICARE PART A AND B / Product Type: *No Product type* /    Admit Date/Time:  10/07/2022  4:00 PM  Primary Diagnosis:  Lumbar stenosis  Hospital Problems: Principal Problem:   Lumbar stenosis    Expected Discharge Date: Expected Discharge Date: 10/14/22  Team Members Present: Physician leading conference: Dr. Genice Rouge Social Worker Present: Dossie Der, LCSW Nurse Present: Vedia Pereyra, RN PT Present: Truitt Leep, PT OT Present: Velia Meyer, OT PPS Coordinator present : Fae Pippin, SLP     Current Status/Progress Goal Weekly Team Focus  Bowel/Bladder   continent of b/b; [facility] 7/16   remain continent   assist with toileting needs prn    Swallow/Nutrition/ Hydration               ADL's   CGA LB ADLs, SBA UB ADLs, CGA functional transfers   Mod I ADLs and functional transfers   general strength/endurance, shower in PM session, AE training for increased independence with LB ADLs    Mobility   supervision bed + w/c mobility >400 ft, CGA transfers, gait ~150 ft, steps x 4 (2 HR: 6 inches), curb step   Mod I  activity tolerance and AD selection    Communication                Safety/Cognition/ Behavioral Observations               Pain   c/o pain to backl prn norco   pain level <6/10   assess pain QS and prn    Skin   back incision w/steri strips in place; MASD to groin and scrotum   remain free of new skin breakdown/infection  assess skin QS and prn      Discharge Planning:  new evaluation going to son's for one week at discharge, will have intermittent assist   Team Discussion: Lumbar stenosis. Checking PVRs for overflow due to incontinence of urine. Getting  pictures of wound to penis and back, both reported PTA. Recent weight loss of 50lbs since February. Blood pressure controlled. Working on AE for increased independence. Strength and endurance.  Patient on target to meet rehab goals: Working on goals with discharge date of 10/14/22  *See Care Plan and progress notes for long and short-term goals.   Revisions to Treatment Plan:  Push fluids or will have to order IV fluids. Check labs Thursday. Encourage medication prior to therapy.  Monitor blood sugar AC/HS Teaching Needs: Medications, safety, self care, skin/wound care, gait/transfer training, etc.   Current Barriers to Discharge: Decreased caregiver support, Incontinence, Neurogenic bowel and bladder, Wound care, and Weight  Possible Resolutions to Barriers: Family education Independently manage bowel/bladder  Independent with wound care Diet/lifestyle modifications to improve overall health Order recommended DME if needed.      Medical Summary Current Status: inconitnent and somewhat continent- LBM yesterday- liquid- needs timed toileting- might have overflow bladder- wound on penis and back  Barriers to Discharge: Incontinence;Complicated Wound;Medical stability;Morbid Obesity;Self-care education;Weight bearing restrictions  Barriers to Discharge Comments: limited by: pain; endurance and incontnence Possible Resolutions to [employer] Focus: plans to con't pain meds for pain control- ask pt to take prior to therapy-  d/c 7/22   Continued Need for Acute Rehabilitation Level of Care: The patient requires daily medical management by a physician with specialized training in physical medicine and rehabilitation for the following reasons: Direction of a multidisciplinary physical rehabilitation program to maximize functional independence : Yes Medical management of patient stability for increased activity during participation in an intensive rehabilitation regime.: Yes Analysis of  laboratory values and/or radiology reports with any subsequent need for medication adjustment and/or medical intervention. : Yes   I attest that I was present, lead the team conference, and concur with the assessment and plan of the team.   Jearld Adjutant 10/09/2022, 6:54 AM

## 2022-10-08 NOTE — Progress Notes (Signed)
Inpatient Rehabilitation  Patient information reviewed and entered into eRehab system by Kelly Gentry, OTR/L, Rehab Quality Coordinator.   Information including medical coding, functional ability and quality indicators will be reviewed and updated through discharge.   

## 2022-10-08 NOTE — Progress Notes (Signed)
Physical Therapy Assessment and Plan  Patient Details  Name: Dwayne Nelson MRN: [id number] Date of Birth: September 26, 1955  PT Diagnosis: Abnormality of gait Rehab Potential: Good ELOS: 5-7 days   Today's Date: 10/08/2022 PT Individual Time: 4742-5956 PT Individual Time Calculation (min): 71 min    Hospital Problem: Principal Problem:   Lumbar stenosis   Past Medical History:  Past Medical History:  Diagnosis Date   Cancer (HCC)    LPFA skin cancer Dr. Terri Piedra   Diabetes mellitus without complication (HCC)    Hyperlipidemia    Hypertension    Past Surgical History:  Past Surgical History:  Procedure Laterality Date   CIRCUMCISION N/A 03/29/2021   Procedure: DORSAL SLIT CIRCUMCISION ADULT;  Surgeon: Marcine Matar, MD;  Location: [facility];  Service: Urology;  Laterality: N/A;   colonoscopy N/A    NO PAST SURGERIES     SKIN CANCER EXCISION     With Dr Terri Piedra outpatient procedure    Assessment & Plan Clinical Impression: Patient is a 67 y.o. year old male  with a history of spondylolisthesis of the lumbar region who is status post lumbar decompression at L3, L4, and L5 with posterior fusion at L4-5 by Dr. Lovell Sheehan on 09/30/2022.  He was discharged home on 10/01/2022.  He presented to the [facility] emergency department on 7/12 with increasing pain not relieved by prescriptive medications.  The patient is accompanied by his son with whom he was staying after discharge.  The patient's states he took the last week off from work to care for his father and is unable to provide adequate care and cannot take further time off from work.  Follow-up imaging demonstrated anticipated postoperative findings with stable appearing stenosis at L3-4 and decreased thecal sac narrowing L4-5.  Moderate stenosis noted.  Neurosurgery admitted the patient and felt he would improve with continued time, therapy and would pursue inpatient rehabilitation versus skilled nursing facility placement.   Blood work reveals normal CBC and slight elevation of blood glucose.  Multiple agents for diabetes mellitus and hypertension restarted.  Vital signs are stable.  He is tolerating his diet.The patient requires inpatient medicine and rehabilitation evaluations and services for ongoing dysfunction secondary to spondylolisthesis of the lumbar region status post lumbar decompression.   Patient gives a history of fall in a store last February resulting in back injury.  Initial evaluation with CT scan in the emergency department revealed superior endplate compression fracture of L4 vertebral body with mild retropulsion as well as nondisplaced fracture of both L4 pedicles.  Patient was admitted and then discharged to a skilled nursing facility.  At that time no surgery was advised.  Patient followed up with neurosurgery who performed flexion-extension views of the lumbar spine and found instability.  Patient was scheduled for elective lumbar decompression and fusion   Occasional incontinence of bowel but having bowel movements.  No urinary retention noted.  Continent of urine. Patient transferred to CIR on 10/07/2022 .   Patient currently requires  CGA  with mobility secondary to muscle weakness and decreased cardiorespiratoy endurance.  Prior to hospitalization, patient was modified independent  with mobility and lived with Son in a House home.  Home access is 2Stairs to enter.  Patient will benefit from skilled PT intervention to maximize safe functional mobility, minimize fall risk, and decrease caregiver burden for planned discharge home with intermittent assist.  Anticipate patient will benefit from follow up [facility] at discharge.  PT - End of Session Activity Tolerance: Tolerates  30+ min activity with multiple rests Endurance Deficit: Yes Endurance Deficit Description: decreased PT Assessment Rehab Potential (ACUTE/IP ONLY): Good PT Barriers to Discharge: Home environment access/layout;Decreased caregiver  support PT Barriers to Discharge Comments: discharge to son's house PT Patient demonstrates impairments in the following area(s): Balance;Pain;Endurance;Motor PT Transfers Functional Problem(s): Bed Mobility;Bed to Chair;Car PT Locomotion Functional Problem(s): Ambulation;Wheelchair Mobility PT Plan PT Intensity: Minimum of 1-2 x/day ,45 to 90 minutes PT Frequency: 5 out of 7 days PT Duration Estimated Length of Stay: 5-7 days PT Treatment/Interventions: Ambulation/gait training;Discharge planning;DME/adaptive equipment instruction;Functional mobility training;Pain management;Psychosocial support;Therapeutic Activities;UE/LE Strength taining/ROM;Wheelchair propulsion/positioning;UE/LE Coordination activities;Therapeutic Exercise;Stair training;Patient/family education;Neuromuscular re-education;Community reintegration;Balance/vestibular training;Disease management/prevention PT Transfers Anticipated Outcome(s): Mod I PT Locomotion Anticipated Outcome(s): Mod I PT Recommendation Recommendations for Other Services: Other (comment) (TBD) Follow Up Recommendations: Home health PT Patient destination: Home Equipment Recommended: To be determined Equipment Details: owns RW, walking stick and w/c   PT Evaluation Precautions/Restrictions Precautions Precautions: Fall;Back Required Braces or Orthoses: Spinal Brace Spinal Brace: Lumbar corset;Applied in sitting position Restrictions Weight Bearing Restrictions: No General   Vital Signs  Pain Pain Assessment Pain Scale: 0-10 Pain Score: 4  Pain Interference Pain Interference Pain Effect on Sleep: 3. Frequently Pain Interference with Therapy Activities: 3. Frequently Pain Interference with Day-to-Day Activities: 3. Frequently Home Living/Prior Functioning Home Living Living Arrangements: Children Available Help at Discharge: Family;Available PRN/intermittently Type of Home: House Home Access: Stairs to enter [employer] of  Steps: 2 Entrance Stairs-Rails: None Home Layout: One level Bathroom Shower/Tub: Walk-in shower;Tub/shower unit Bathroom Toilet: Handicapped height Bathroom Accessibility: Yes  Lives With: Son Prior Function Level of Independence: Requires assistive device for independence  Able to Take Stairs?: Yes Driving: Yes Vocation: Retired Museum/gallery exhibitions officer Overall Cognitive Status: Within Functional Limits for tasks assessed Arousal/Alertness: Awake/alert Orientation Level: Oriented X4 Memory: Appears intact Awareness: Appears intact Problem Solving: Appears intact Safety/Judgment: Appears intact Sensation Sensation Light Touch: Appears Intact Proprioception: Appears Intact Coordination Gross Motor Movements are Fluid and Coordinated: No Fine Motor Movements are Fluid and Coordinated: Yes Coordination and Movement Description: grossly uncoordinated and limited due to pain and decreased activity tolerance Finger Nose Finger Test: [facility] Heel Shin Test: [facility] Motor  Motor Motor: Other (comment) Motor - Skilled Clinical Observations: Grossly uncoordinated with activity tolerance deficits   Trunk/Postural Assessment  Cervical Assessment Cervical Assessment: Within Functional Limits Thoracic Assessment Thoracic Assessment: Within Functional Limits Lumbar Assessment Lumbar Assessment: Exceptions to [facility] (lumbar corset) Postural Control Postural Control: Within Functional Limits  Balance Balance Balance Assessed: Yes Static Sitting Balance Static Sitting - Balance Support: Feet supported Static Sitting - Level of Assistance: 6: Modified independent (Device/Increase time) Dynamic Sitting Balance Dynamic Sitting - Balance Support: Bilateral upper extremity supported Dynamic Sitting - Level of Assistance: 6: Modified independent (Device/Increase time) Static Standing Balance Static Standing - Balance Support: Bilateral upper extremity supported Static Standing - Level of  Assistance: 5: Stand by assistance (CGA) Dynamic Standing Balance Dynamic Standing - Balance Support: Bilateral upper extremity supported Dynamic Standing - Level of Assistance: 5: Stand by assistance (CGA) Extremity Assessment      RLE Assessment RLE Assessment: Within Functional Limits General Strength Comments: Grossly 4+/5 LLE Assessment LLE Assessment: Within Functional Limits General Strength Comments: Grossly 4+/5  Care Tool Care Tool Bed Mobility Roll left and right activity   Roll left and right assist level: Supervision/Verbal cueing    Sit to lying activity   Sit to lying assist level: Supervision/Verbal cueing    Lying  to sitting on side of bed activity   Lying to sitting on side of bed assist level: the ability to move from lying on the back to sitting on the side of the bed with no back support.: Supervision/Verbal cueing     Care Tool Transfers Sit to stand transfer   Sit to stand assist level: Contact Guard/Touching assist    Chair/bed transfer   Chair/bed transfer assist level: Contact Guard/Touching assist     Toilet transfer   Assist Level: Contact Guard/Touching assist    Car transfer   Car transfer assist level: Contact Guard/Touching assist      Care Tool Locomotion Ambulation   Assist level: Contact Guard/Touching assist Assistive device: Walker-rolling Max distance: 150 ft  Walk 10 feet activity   Assist level: Contact Guard/Touching assist Assistive device: Walker-rolling   Walk 50 feet with 2 turns activity   Assist level: Contact Guard/Touching assist Assistive device: Walker-rolling  Walk 150 feet activity   Assist level: Contact Guard/Touching assist Assistive device: Walker-rolling  Walk 10 feet on uneven surfaces activity   Assist level: Contact Guard/Touching assist Assistive device: Walker-rolling  Stairs   Assist level: Contact Guard/Touching assist Stairs assistive device: 2 hand rails Max number of stairs: 4  Walk  up/down 1 step activity   Walk up/down 1 step (curb) assist level: Contact Guard/Touching assist Walk up/down 1 step or curb assistive device: Walker  Walk up/down 4 steps activity   Walk up/down 4 steps assist level: Contact Guard/Touching assist Walk up/down 4 steps assistive device: 2 hand rails  Walk up/down 12 steps activity Walk up/down 12 steps activity did not occur: Safety/medical concerns (fatigue)      Pick up small objects from floor Pick up small object from the floor (from standing position) activity did not occur: Safety/medical concerns (fatigue, pain)      Wheelchair Is the patient using a wheelchair?: Yes Type of Wheelchair: Manual   Wheelchair assist level: Supervision/Verbal cueing Max wheelchair distance: >400 ft  Wheel 50 feet with 2 turns activity   Assist Level: Supervision/Verbal cueing  Wheel 150 feet activity   Assist Level: Supervision/Verbal cueing    Refer to Care Plan for Long Term Goals  SHORT TERM GOAL WEEK 1 PT Short Term Goal 1 (Week 1): STG=LTG's due to ELOS  Recommendations for other services: None   Skilled Therapeutic Intervention Mobility Bed Mobility Bed Mobility: Rolling Right;Rolling Left;Sit to Supine;Supine to Sit Rolling Right: Supervision/verbal cueing Rolling Left: Supervision/Verbal cueing Supine to Sit: Supervision/Verbal cueing Sit to Supine: Supervision/Verbal cueing Transfers Transfers: Sit to Stand;Stand to Sit;Stand Pivot Transfers Sit to Stand: Contact Guard/Touching assist Stand to Sit: Contact Guard/Touching assist Stand Pivot Transfers: Contact Guard/Touching assist Transfer (Assistive device): Rolling walker Locomotion  Gait Ambulation: Yes Gait Assistance: Contact Guard/Touching assist Gait Distance (Feet): 150 Feet Assistive device: Rolling walker Gait Gait: Yes Gait Pattern: Impaired Gait Pattern: Poor foot clearance - left;Poor foot clearance - right Gait velocity: decreased Stairs / Additional  Locomotion Stairs: Yes Stairs Assistance: Contact Guard/Touching assist Stair Management Technique: Two rails Number of Stairs: 4 Height of Stairs: 6 (inches) Ramp: Contact Guard/touching assist Curb: Doctor, hospital Mobility: Yes Wheelchair Assistance: Doctor, general practice: Both upper extremities Wheelchair Parts Management: Needs assistance Distance: >400 ft  PT Evaluation:   Pt received semi-reclined in bed, agreeable to PT evaluation. Pt reports 3-4/10 to right lateral back, nurse notified and administered pain medication.   PT assessed pain interference, sensation, coordination, strength and  balance and obtained subjective history.   Pt requires (S) with bed and w/c mobility >400 ft. Pt CGA with transfers, gait > 150 ft with RW, curb step and stair navigation x 4 with 2 HR's.   Pt propelled Nu Step at level 4 x 10 min per patient request and to address activity tolerance deficits. Pt returned to room by ambulating with RW and left semi-reclined in bed with all needs in reach and alarm on.    Discharge Criteria: Patient will be discharged from PT if patient refuses treatment 3 consecutive times without medical reason, if treatment goals not met, if there is a change in medical status, if patient makes no progress towards goals or if patient is discharged from hospital.  The above assessment, treatment plan, treatment alternatives and goals were discussed and mutually agreed upon: by patient  Truitt Leep 10/08/2022, 12:20 PM

## 2022-10-09 DIAGNOSIS — M4326 Fusion of spine, lumbar region: Principal | ICD-10-CM | POA: Diagnosis present

## 2022-10-09 LAB — GLUCOSE, CAPILLARY
Glucose-Capillary: 57 mg/dL — ABNORMAL LOW (ref 70–99)
Glucose-Capillary: 96 mg/dL (ref 70–99)
Glucose-Capillary: 134 mg/dL — ABNORMAL HIGH (ref 70–99)
Glucose-Capillary: 129 mg/dL — ABNORMAL HIGH (ref 70–99)
Glucose-Capillary: 301 mg/dL — ABNORMAL HIGH (ref 70–99)

## 2022-10-09 MED ORDER — INSULIN GLARGINE-YFGN 100 UNIT/ML ~~LOC~~ SOLN
25.0000 [IU] | Freq: Two times a day (BID) | SUBCUTANEOUS | Status: DC
  Administered 2022-10-09 – 2022-10-10 (×2): 25 [IU] via SUBCUTANEOUS
  Filled 2022-10-09 (×3): qty 0.25

## 2022-10-09 MED ORDER — TRAMADOL HCL 50 MG PO TABS
50.0000 mg | ORAL_TABLET | Freq: Four times a day (QID) | ORAL | Status: DC
  Administered 2022-10-09 – 2022-10-10 (×3): 50 mg via ORAL
  Filled 2022-10-09 (×3): qty 1

## 2022-10-09 NOTE — Progress Notes (Signed)
Occupational Therapy Session Note  Patient Details  Name: Dwayne Nelson MRN: [id number] Date of Birth: [birth year]  Today's Date: 10/09/2022 OT Individual Time: 0900-1000 OT Individual Time Calculation (min): 60 min    Short Term Goals: Week 1:  OT Short Term Goal 1 (Week 1): STG=LTG d/t ELOS  Skilled Therapeutic Interventions/Progress Updates:      Therapy Documentation Precautions:  Precautions Precautions: Fall, Back Required Braces or Orthoses: Spinal Brace Spinal Brace: Lumbar corset, Applied in sitting position Restrictions Weight Bearing Restrictions: No General:  "I am a certified scuba diver." Pt supine in bed upon OT arrival, agreeable to OT session.  Pain: 5/10 lower back pain, reports recent medication, activity and positioning provided for pain management   ADL: Bed mobility: SBA supine>EOB  Oral hygiene: distant supervision seated in W/C, attempted to stand at sink to complete, pain increasing with standing and pt requested to sit in W/C to complete UB dressing: SBA seated EOB for overhead shirt and lumbar corset LB dressing: CGA at RW, seated to don over feet and standing to manage over hips Footwear: total A for slip on shoes Transfers: CGA with RW stand pivot transfers    Exercises: Pt completed 15 minutes of nu step bike, 15 min forward order to increase BUE/BLEstrength and endurance in preparation for increased independence in ADLs such as bathing. No rest break required on level 5 resistance. Pt kept moderate pace throughout time on bike.  Other Treatment: Pt able to recall 3/3 spinal precautions, able to carryover through functional activities.  Therapy/Group: Individual Therapy  Velia Meyer, OTD, OTR/L 10/09/2022, 10:44 AM

## 2022-10-09 NOTE — Progress Notes (Signed)
Physical Therapy Session Note  Patient Details  Name: Dwayne Nelson MRN: [id number] Date of Birth: 1956-01-12  Today's Date: 10/09/2022 PT Individual Time: 1100-1156 PT Individual Time Calculation (min): 56 min   Short Term Goals: Week 1:  PT Short Term Goal 1 (Week 1): STG=LTG's due to ELOS  Skilled Therapeutic Interventions/Progress Updates:      Therapy Documentation Precautions:  Precautions Precautions: Fall, Back Required Braces or Orthoses: Spinal Brace Spinal Brace: Lumbar corset, Applied in sitting position Restrictions Weight Bearing Restrictions: No  Pt received semi-reclined in bed, agreeable to PT and with reports of radicular back/buttock pain ranging from 4-8/10. Pt pre-medicated and provided rest/repositioning for relief. PT session with emphasis on gait training to increase activity tolerance and reduce fall risk as pt presents with shuffle gait and poor foot clearance. Pt responds well to verbal and visual cues of heel to toe pattern and ambulated following distances:    1. RW 125 ft   2. RW 60 ft (emphasis on verbal cue of heel toe and B 1.5 ankle weights to increase foot clearance)   3. Rollator 60 ft x 2  1.5 # ankle weights B   4. Rollator 125 ft without ankle weights   Pt left semi-reclined in bed with all needs in reach and alarm on.     Therapy/Group: Individual Therapy  Truitt Leep Truitt Leep PT, DPT  10/09/2022, 6:25 AM

## 2022-10-09 NOTE — Progress Notes (Signed)
Physical Therapy Session Note  Patient Details  Name: Dwayne Nelson MRN: [id number] Date of Birth: 08-Nov-1955  Today's Date: 10/09/2022 PT Individual Time: 4098-1191 PT Individual Time Calculation (min): 44 min   Short Term Goals: Week 1:  PT Short Term Goal 1 (Week 1): STG=LTG's due to ELOS  Skilled Therapeutic Interventions/Progress Updates:    Pt received supine in bed awake and agreeable to therapy session. Pt confides in therapist about his long journey with his injury and then to having surgery and now feeling like he is still starting over at square one. Therapist provides emotional support and encouragement on hopefully now being on path to recovery and the ebbs and flows that might come along with it. Upon questioning about rollator use pt states it is a "new speed to get used to" and reports his therapist gave feedback that he is picking his feet up better. Therapist has to provide mod cuing throughout session for proper brake management with use of rollator.  Supine>sitting R EOB via logroll, HOB partially elevated and using bedrail, with supervision. Sitting EOB, donned LSO set-up assist.  Sit<>stands using rollator with CGA for safety during session. Gait training ~123ft x2 to/from dayroom using rollator with CGA progressed to light min assist for safety/steadying with fatigue and when turning to sit. Pt demonstrating the following gait deviations with therapist providing the described cuing and facilitation for improvement:  - continues to have decreased B LE foot clearance with tendency to land more towards midfoot contact and decreased step lengths bilaterally - continues to support himself significantly through B UEs on AD - decreased gait speed  - slightly more crouched posture during gait  Pt then confides in therapist that he has been having significant pain in bilateral buttocks (R>L) that sometimes feels like a "shotgun" pain going down through his buttocks radiating  down through the back of his legs to his knees. Reports that he requires pain medications in order to participate in therapy but that the pain is still limiting him.   Sit<>supine via logroll on mat table with min assist for trunk control (doffed LSO for supine/sidelying activities).   Started in supine position with goal of performing figure-4 piriformis and single-knee to chest stretch; however, pt unable to reach a comfortable position in supine and is in a constant guarded position despite attempts at cuing for breathing/relaxation techniques and repositioning LEs in hooklying vs extended position.  Transitioned to L sidelying position and performed of soft tissue mobilization to pt's R piriformis and gluteal muscles. Pt with noticeable trigger point in piriformis muscle that improved during manual therapy. Educated pt on pain-spasm-pain cycle and needing to perform pain management techniques to break this loop.   Returned to sitting EOM and pt reports that is the best his buttocks pain has felt.   Gait training back to room as described above and pt continues to report significant improvement in his pain. Pt left seated EOB with needs in reach, LSO in place, bed alarm on, and meal tray set-up.    Therapy Documentation Precautions:  Precautions Precautions: Fall, Back Required Braces or Orthoses: Spinal Brace Spinal Brace: Lumbar corset, Applied in sitting position Restrictions Weight Bearing Restrictions: No   Pain:  Reports bilateral buttocks pain (R>L) that overall has been impacting his ability to participate in therapy - provided soft tissue mobilization during session with excellent pain management results.    Therapy/Group: Individual Therapy  Ginny Forth , PT, DPT, NCS, CSRS 10/09/2022, 3:41 PM

## 2022-10-09 NOTE — Progress Notes (Signed)
PROGRESS NOTE   Subjective/Complaints:  Pt reports when meds wear off ~ 4-5 hours after meds given, pain skyrockets- to 8-10/10.   Had a low CBG this AM of 57- up to 90 now-  Asked nursing to give 15 units Semglee and will address and hold Jardiance.   LBM last night ROS:  Pt denies SOB, abd pain, CP, N/V/C/D, and vision changes   Objective:   No results found. Recent Labs    10/08/22 0447  WBC 6.7  HGB 13.7  HCT 43.0  PLT 284   Recent Labs    10/08/22 0447  NA 138  K 3.5  CL 99  CO2 26  GLUCOSE 87  BUN 26*  CREATININE 1.10  CALCIUM 9.0    Intake/Output Summary (Last 24 hours) at 10/09/2022 0827 Last data filed at 10/09/2022 0030 Gross per 24 hour  Intake 476 ml  Output 450 ml  Net 26 ml        Physical Exam: Vital Signs Blood pressure 107/63, pulse 63, temperature 97.8 F (36.6 C), resp. rate 18, height 5\' 10"  (1.778 m), weight 105.7 kg, SpO2 95%.    General: supine in bed; sleepy- kept falling back asleep- NAD HENT: conjugate gaze; oropharynx moist CV: regular rate and rhythm; no JVD Pulmonary: CTA B/L; no W/R/R- good air movement GI: soft, NT, ND, (+)BS- slightly hypoactive Psychiatric: appropriate Neurological: Ox3 except for being sleepy  Skin: No evidence of breakdown, no evidence of rash, lumbar incision clean dry well-approximated no drainage induration or fluctuance Neurologic: Alert and oriented x 3 motor strength is 5/5 in bilateral deltoid, bicep, tricep, grip, 4/5 bilateral hip flexor, knee extensors, ankle dorsiflexor and plantar flexor Sensory exam normal sensation to light touch bilateral upper and lower extremities Tone is normal in bilateral lower extremities Musculoskeletal: Full range of motion in all 4 extremities. No joint swelling  Assessment/Plan: 1. Functional deficits which require 3+ hours per day of interdisciplinary therapy in a comprehensive inpatient rehab  setting. Physiatrist is providing close team supervision and 24 hour management of active medical problems listed below. Physiatrist and rehab team continue to assess barriers to discharge/monitor patient progress toward functional and medical goals  Care Tool:  Bathing    Body parts bathed by patient: Right arm, Right lower leg, Left lower leg, Left arm, Chest, Face, Abdomen, Front perineal area, Buttocks, Right upper leg, Left upper leg         Bathing assist Assist Level: Contact Guard/Touching assist     Upper Body Dressing/Undressing Upper body dressing   What is the patient wearing?: Pull over shirt    Upper body assist Assist Level: Supervision/Verbal cueing    Lower Body Dressing/Undressing Lower body dressing      What is the patient wearing?: Underwear/pull up, Pants     Lower body assist Assist for lower body dressing: Contact Guard/Touching assist     Toileting Toileting    Toileting assist Assist for toileting: Contact Guard/Touching assist     Transfers Chair/bed transfer  Transfers assist     Chair/bed transfer assist level: Contact Guard/Touching assist     Locomotion Ambulation   Ambulation assist  Assist level: Contact Guard/Touching assist Assistive device: Walker-rolling Max distance: 150 ft   Walk 10 feet activity   Assist     Assist level: Contact Guard/Touching assist Assistive device: Walker-rolling   Walk 50 feet activity   Assist    Assist level: Contact Guard/Touching assist Assistive device: Walker-rolling    Walk 150 feet activity   Assist    Assist level: Contact Guard/Touching assist Assistive device: Walker-rolling    Walk 10 feet on uneven surface  activity   Assist     Assist level: Contact Guard/Touching assist Assistive device: Walker-rolling   Wheelchair     Assist Is the patient using a wheelchair?: Yes Type of Wheelchair: Manual    Wheelchair assist level:  Supervision/Verbal cueing Max wheelchair distance: >400 ft    Wheelchair 50 feet with 2 turns activity    Assist        Assist Level: Supervision/Verbal cueing   Wheelchair 150 feet activity     Assist      Assist Level: Supervision/Verbal cueing   Blood pressure 107/63, pulse 63, temperature 97.8 F (36.6 C), resp. rate 18, height 5\' 10"  (1.778 m), weight 105.7 kg, SpO2 95%.  Medical Problem List and Plan: 1. Functional deficits secondary to lumbar spondylolisthesis with lumbar stenosis              -patient may  shower             -ELOS/Goals: 7-10d, Mod I   D/c 7/22  Con't CIR PT and OT 2.  Antithrombotics: -DVT/anticoagulation:  Pharmaceutical: Heparin             -antiplatelet therapy: Aspirin 81 mg daily   3. Pain Management: Tylenol as needed             -Continue gabapentin 300 mg 3 times daily   7/16- also has Percocet q4 hours- says brings pain down to 2-3/10 with rest- asked pt to ask for pain meds prior to therapy.   7/17- taking q4-5 hours prn- to keep pain under control- wil add Tramadol 50 mg q6 hours to try and calm the pain in  the time in between meds 4. Mood/Behavior/Sleep: LCSW to evaluate and provide emotional support             -antipsychotic agents: n/a   5. Neuropsych/cognition: This patient is capable of making decisions on his own behalf.   6. Skin/Wound Care: Routine skin care checks   7/16- incision looks OK- con't regimen 7. Fluids/Electrolytes/Nutrition: Routine Is and Os and follow-up chemistries   8: Hypertension: monitor TID and prn             -Continue amlodipine 5 mg daily             -Continue metoprolol succinate 50 mg nightly             -Continue benazepril 20 mg daily             -Continue hydrochlorothiazide 25 mg daily   7/16- BP controlled- con't regimen 9: Hyperlipidemia: continue statin   10: DM: CBGs 4 times daily; A1c = 7.5% (home = Jardiance, Soliqua,metformin, Actos)             -Continue Jardiance 25 mg  daily             -Continue Semglee 30 units twice daily             -Continue metformin 1000 mg twice daily             -  Continue Actos 30 mg nightly   7/16- CBGs somewhat labile- 92-242 in last 24 hours- con't regimen and monitor trend to see if needs to make changes.   7/17- will reduce Semglee to 25 units BID- due to hypoglycemia this AM- will only give 15 units this AM and hold Jardiance this Am- give Metformin.  11. Azotemia  7/16- BUN 26- will encourage fluids and recheck Thursday   I spent a total of 38   minutes on total care today- >50% coordination of care- due to  D/w nursing about low CBG of 57 and reviewed all CBGs in last 3 days- has been on low side- usually under or around 100- will reduce Semglee.     LOS: 2 days A FACE TO FACE EVALUATION WAS PERFORMED  Dwayne Nelson 10/09/2022, 8:27 AM

## 2022-10-09 NOTE — Progress Notes (Signed)
Hypoglycemic Event  CBG: 57  Treatment: 8 oz juice/soda  Symptoms: None  Follow-up CBG: Time:0700  CBG Result:96   Possible Reasons for Event: Medication regimen:    Comments/MD notified: PA Cruzita Lederer notified    Luetta Nutting

## 2022-10-09 NOTE — Progress Notes (Addendum)
Notified of bloody drainage from incision this morning. Dry dressing applied.  On exam, his ABD pad is mildly saturated with serosanguineous drainage. No odor. I cannot express any drainage or blood. Non-tender. Painted with Betadine and foam border dressing applied. He maintained on aspirin and heparin [place] for VTE prophy. Mostly likely drained old hematoma. Advised him to refrain from supine position as much as possible.

## 2022-10-09 NOTE — Progress Notes (Signed)
Occupational Therapy Session Note  Patient Details  Name: MASTER TOUCHET MRN: [id number] Date of Birth: April 14, 1955  Today's Date: 10/09/2022 OT Individual Time: 1345-1427 OT Individual Time Calculation (min): 42 min    Short Term Goals: Week 1:  OT Short Term Goal 1 (Week 1): STG=LTG d/t ELOS  Skilled Therapeutic Interventions/Progress Updates:    Pt resting in bed upon arrival. OT intervention with focus on bed mobility, functional amb with Rollator and RW, standing balance, and safety awareness to increase independence with bADLs. Supine>sit EOB with supervision using bed rails. Pt donned LSO with supervision. Amb with Rollator to w/c in room. Transition to gym. Standing activity with bean bags and corn home. Pt amb with RW to retrieve bags from floor and reported increase in pain. Returned to w/c and propelled w/c back to room. Squat pivot to bed with CGA. Sit>supine with supervision. OTA palpated origin of reported pain. Probable piriformis in origin. Discussed with primary PT. Pt remained in bed with all needs within reach. Bed alarm activated.   Therapy Documentation Precautions:  Precautions Precautions: Fall, Back Required Braces or Orthoses: Spinal Brace Spinal Brace: Lumbar corset, Applied in sitting position Restrictions Weight Bearing Restrictions: No Pain:  Pt reports 2/10 back pain at rest and 5/10 when standing/amb; returned to bed at end of session and pain decreased tp 3/10; rest and repositioned   Therapy/Group: Individual Therapy  Rich Brave 10/09/2022, 2:35 PM

## 2022-10-10 DIAGNOSIS — M4326 Fusion of spine, lumbar region: Secondary | ICD-10-CM | POA: Diagnosis not present

## 2022-10-10 LAB — BASIC METABOLIC PANEL
Anion gap: 7 (ref 5–15)
BUN: 22 mg/dL (ref 8–23)
CO2: 27 mmol/L (ref 22–32)
Calcium: 8.8 mg/dL — ABNORMAL LOW (ref 8.9–10.3)
Chloride: 98 mmol/L (ref 98–111)
Creatinine, Ser: 1.05 mg/dL (ref 0.61–1.24)
GFR, Estimated: >60 mL/min (ref >60)
Glucose, Bld: 67 mg/dL — ABNORMAL LOW (ref 70–99)
Potassium: 3.5 mmol/L (ref 3.5–5.1)
Sodium: 132 mmol/L — ABNORMAL LOW (ref 135–145)

## 2022-10-10 LAB — GLUCOSE, CAPILLARY
Glucose-Capillary: 67 mg/dL — ABNORMAL LOW (ref 70–99)
Glucose-Capillary: 83 mg/dL (ref 70–99)
Glucose-Capillary: 69 mg/dL — ABNORMAL LOW (ref 70–99)
Glucose-Capillary: 144 mg/dL — ABNORMAL HIGH (ref 70–99)
Glucose-Capillary: 110 mg/dL — ABNORMAL HIGH (ref 70–99)
Glucose-Capillary: 137 mg/dL — ABNORMAL HIGH (ref 70–99)

## 2022-10-10 MED ORDER — METHOCARBAMOL 750 MG PO TABS
750.0000 mg | ORAL_TABLET | Freq: Four times a day (QID) | ORAL | Status: DC
  Administered 2022-10-10 (×4): 750 mg via ORAL
  Filled 2022-10-10 (×6): qty 1

## 2022-10-10 MED ORDER — INSULIN GLARGINE-YFGN 100 UNIT/ML ~~LOC~~ SOLN
20.0000 [IU] | Freq: Two times a day (BID) | SUBCUTANEOUS | Status: DC
  Administered 2022-10-10 – 2022-10-14 (×8): 20 [IU] via SUBCUTANEOUS
  Filled 2022-10-10 (×9): qty 0.2

## 2022-10-10 NOTE — Progress Notes (Signed)
PROGRESS NOTE   Subjective/Complaints:  Pt reports feeling better this Am- less sleepy.  Doing "pretty good".  Having a LOT of muscle spams/knots in buttocks- Carly worked on them- somewhat better, but main pain is muscle spasms- Robaxin works the best- tramadol hasn't helped at all. When takes pain meds, helps, but robaxin best.  We discussed the splinting effect of muscles after an injury and cause of pain- can do trigger point injections - described, if doesn't improve.   LBM last night ROS:  Pt denies SOB, abd pain, CP, N/V/C/D, and vision changes  Objective:   No results found. Recent Labs    10/08/22 0447  WBC 6.7  HGB 13.7  HCT 43.0  PLT 284   Recent Labs    10/08/22 0447 10/10/22 0640  NA 138 132*  K 3.5 3.5  CL 99 98  CO2 26 27  GLUCOSE 87 67*  BUN 26* 22  CREATININE 1.10 1.05  CALCIUM 9.0 8.8*    Intake/Output Summary (Last 24 hours) at 10/10/2022 0846 Last data filed at 10/10/2022 0452 Gross per 24 hour  Intake 714 ml  Output 1250 ml  Net -536 ml        Physical Exam: Vital Signs Blood pressure 113/71, pulse (!) 54, temperature 97.6 F (36.4 C), resp. rate 17, height 5\' 10"  (1.778 m), weight 105.7 kg, SpO2 98%.     General: awake, alert, appropriate, laying on R side in bed- more awake; NAD HENT: conjugate gaze; oropharynx moist CV: regular  rhythm, bradycardic rate; no JVD Pulmonary: CTA B/L; no W/R/R- good air movement GI: soft, NT, ND, (+)BS- normoactive Psychiatric: appropriate- interactive Neurological: Ox3  Skin: small amount of nickel sized sanguinous drainage on dressing- incision looks great- mild swelling but no erythema Neurologic: Alert and oriented x 3 motor strength is 5/5 in bilateral deltoid, bicep, tricep, grip, 4/5 bilateral hip flexor, knee extensors, ankle dorsiflexor and plantar flexor Sensory exam normal sensation to light touch bilateral upper and lower  extremities Tone is normal in bilateral lower extremities Musculoskeletal: Full range of motion in all 4 extremities. No joint swelling  Assessment/Plan: 1. Functional deficits which require 3+ hours per day of interdisciplinary therapy in a comprehensive inpatient rehab setting. Physiatrist is providing close team supervision and 24 hour management of active medical problems listed below. Physiatrist and rehab team continue to assess barriers to discharge/monitor patient progress toward functional and medical goals  Care Tool:  Bathing    Body parts bathed by patient: Right arm, Right lower leg, Left lower leg, Left arm, Chest, Face, Abdomen, Front perineal area, Buttocks, Right upper leg, Left upper leg         Bathing assist Assist Level: Contact Guard/Touching assist     Upper Body Dressing/Undressing Upper body dressing   What is the patient wearing?: Pull over shirt    Upper body assist Assist Level: Supervision/Verbal cueing    Lower Body Dressing/Undressing Lower body dressing      What is the patient wearing?: Underwear/pull up, Pants     Lower body assist Assist for lower body dressing: Contact Guard/Touching assist     Toileting Toileting    Toileting assist Assist  for toileting: Contact Guard/Touching assist     Transfers Chair/bed transfer  Transfers assist     Chair/bed transfer assist level: Contact Guard/Touching assist     Locomotion Ambulation   Ambulation assist      Assist level: Contact Guard/Touching assist Assistive device: Walker-rolling Max distance: 150 ft   Walk 10 feet activity   Assist     Assist level: Contact Guard/Touching assist Assistive device: Walker-rolling   Walk 50 feet activity   Assist    Assist level: Contact Guard/Touching assist Assistive device: Walker-rolling    Walk 150 feet activity   Assist    Assist level: Contact Guard/Touching assist Assistive device: Walker-rolling    Walk  10 feet on uneven surface  activity   Assist     Assist level: Contact Guard/Touching assist Assistive device: Walker-rolling   Wheelchair     Assist Is the patient using a wheelchair?: Yes Type of Wheelchair: Manual    Wheelchair assist level: Supervision/Verbal cueing Max wheelchair distance: >400 ft    Wheelchair 50 feet with 2 turns activity    Assist        Assist Level: Supervision/Verbal cueing   Wheelchair 150 feet activity     Assist      Assist Level: Supervision/Verbal cueing   Blood pressure 113/71, pulse (!) 54, temperature 97.6 F (36.4 C), resp. rate 17, height 5\' 10"  (1.778 m), weight 105.7 kg, SpO2 98%.  Medical Problem List and Plan: 1. Functional deficits secondary to lumbar spondylolisthesis with lumbar stenosis              -patient may  shower             -ELOS/Goals: 7-10d, Mod I   D/c 7/22  IPOC today Con't CIR PT and OT 2.  Antithrombotics: -DVT/anticoagulation:  Pharmaceutical: Heparin             -antiplatelet therapy: Aspirin 81 mg daily   3. Pain Management: Tylenol as needed             -Continue gabapentin 300 mg 3 times daily   7/16- also has Percocet q4 hours- says brings pain down to 2-3/10 with rest- asked pt to ask for pain meds prior to therapy.   7/17- taking q4-5 hours prn- to keep pain under control- wil add Tramadol 50 mg q6 hours to try and calm the pain in  the time in between meds  7/18- Will d/c tramadol- not helpful- will increase robaxin to 750 mg QID and if doesn't help enough, will do trigger point injections Tuesday.  4. Mood/Behavior/Sleep: LCSW to evaluate and provide emotional support             -antipsychotic agents: n/a   5. Neuropsych/cognition: This patient is capable of making decisions on his own behalf.   6. Skin/Wound Care: Routine skin care checks   7/16- incision looks OK- con't regimen 7. Fluids/Electrolytes/Nutrition: Routine Is and Os and follow-up chemistries   8: Hypertension:  monitor TID and prn             -Continue amlodipine 5 mg daily             -Continue metoprolol succinate 50 mg nightly             -Continue benazepril 20 mg daily             -Continue hydrochlorothiazide 25 mg daily   7/18- BP controlled- con't regimen- HR 54 this AM- but usually 70's- will  maintain Metoprolol dose. monitor 9: Hyperlipidemia: continue statin   10: DM: CBGs 4 times daily; A1c = 7.5% (home = Jardiance, Soliqua,metformin, Actos)             -Continue Jardiance 25 mg daily             -Continue Semglee 30 units twice daily             -Continue metformin 1000 mg twice daily             -Continue Actos 30 mg nightly   7/16- CBGs somewhat labile- 92-242 in last 24 hours- con't regimen and monitor trend to see if needs to make changes.   7/17- will reduce Semglee to 25 units BID- due to hypoglycemia this AM- will only give 15 units this AM and hold Jardiance this Am- give Metformin.   7/17- reduced Semglee to 20 units BID- for hypoglycemia event this AM- to 67- and levels really controlled- really too controlled 11. Azotemia  7/16- BUN 26- will encourage fluids and recheck Thursday  7/18- BUN down to 22 12. Hematoma  7/18- more drainage from incision yesterday- but looks much better this AM.   I spent a total of  51  minutes on total care today- >50% coordination of care- due to  IPOC today- also prolonged time with pt about Muscle spasms and d/w nursing about hypoglycemia- will titrate/wean down insulin.    LOS: 3 days A FACE TO FACE EVALUATION WAS PERFORMED  Zymir Napoli 10/10/2022, 8:46 AM

## 2022-10-10 NOTE — Progress Notes (Signed)
Physical Therapy Session Note  Patient Details  Name: Dwayne Nelson MRN: [id number] Date of Birth: 07/15/1955  Today's Date: 10/10/2022 PT Individual Time: 1015-1115, 1431-1530  PT Individual Time Calculation (min): 60 min , 49 min  Missed Time: 11 minutes (pain)   Short Term Goals: Week 1:  PT Short Term Goal 1 (Week 1): STG=LTG's due to ELOS  Skilled Therapeutic Interventions/Progress Updates:      Therapy Documentation Precautions:  Precautions Precautions: Fall, Back Required Braces or Orthoses: Spinal Brace Spinal Brace: Lumbar corset, Applied in sitting position Restrictions Weight Bearing Restrictions: No  Treatment Session 1:   Pt received semi-reclined in bed and reports previous PT session with emphasis on manual therapy to piriformis helpful. Pt agreeable to additional soft tissue mobilization, trigger point therapy and cross friction manual therapy to right piriformis and pt reported decrease in pain. Prior to manual therapy PT observed pt to be incontinent of bladder. Pt set-up for peri-care for toileting.   Pt performed 3 x 10 bilateral sciatic nerve glides and 3 x 30 seconds piriformis figure four stretch. PT provided handout with instruction and frequency for exercises.   Pt ambulated ~200 ft with rollator (S) for safety with improved foot clearance and no reports of pain. Pt continues to require min A for heel toe gait pattern.   Pt left semi-reclined in bed with all needs in reach and alarm on.    Treatment Session 2:   Pt received semi-reclined in bed, agreeable to PT session and with unrated low back/buttock pain, provided rest and repositioning for relief. Pt (S) for safety with min cues to increase foot clearance with gait 125 ft x 2. Pt propelled Nu Step x 10 min at level four to address UE/LE flexibility and activity tolerance deficits. Pt able to practice simulated couch transfer (low mat with airex pad) without use of UE's, but relays increased pain.  Session ended early due to spike in pain and pt left semi-reclined with all needs in reach and alarm on.   Therapy/Group: Individual Therapy  Truitt Leep Truitt Leep PT, DPT  10/10/2022, 5:45 AM

## 2022-10-10 NOTE — Progress Notes (Signed)
Occupational Therapy Session Note  Patient Details  Name: Dwayne Nelson MRN: [id number] Date of Birth: [birth year]  Today's Date: 10/10/2022 OT Individual Time: 1345-1425 OT Individual Time Calculation (min): 40 min    Short Term Goals: Week 1:  OT Short Term Goal 1 (Week 1): STG=LTG d/t ELOS  Skilled Therapeutic Interventions/Progress Updates:    Pt resting in bed upon arrival. OT intervention with focus on functional amb with Rollator, standing balance, activity tolerance, and safety awareness to increase independence with BADLs. Supine>sit EOB with supervision using bed rails. Pt required assistance donning shoes without use of AE. Pt donned LSO without assistance. Amb with Rollator to day room with supervision. Standing activities with supervision with rest breaks. Pt amb nursing stations circuit x 2 with rest break with supervision. Pt returned to room and returned to bed. All needs within reach. Bed alarm activated. Reviewed home safety and energy conservation strategies.   Therapy Documentation Precautions:  Precautions Precautions: Fall, Back Required Braces or Orthoses: Spinal Brace Spinal Brace: Lumbar corset, Applied in sitting position Restrictions Weight Bearing Restrictions: No General:   Vital Signs:   Pain:  Pt reports 3/10 sporadic back pain; repositioned  Therapy/Group: Individual Therapy  Rich Brave 10/10/2022, 2:32 PM

## 2022-10-10 NOTE — Significant Event (Signed)
Hypoglycemic Event  CBG: 69  Treatment: 4 oz juice/soda  Symptoms: None  Follow-up CBG: Time:1539 CBG Result:144  Possible Reasons for Event: Unknown  Comments/MD notified: Patient consumed lunch tray.    Marlyne Beards, Suzi Roots

## 2022-10-10 NOTE — Significant Event (Signed)
Hypoglycemic Event  CBG: 67  Treatment: 4 oz juice/soda  Symptoms: None  Follow-up CBG: Time:0725 CBG Result:83  Possible Reasons for Event: Unknown  Comments/MD notified:Patient consumed breakfast tray. Will notify MD during rounds.    Marlyne Beards, Suzi Roots

## 2022-10-10 NOTE — IPOC Note (Signed)
Overall Plan of Care [facility]) Patient Details Name: Dwayne Nelson MRN: [id number] DOB: [birth year]  Admitting Diagnosis: Fusion of lumbar spine  Hospital Problems: Principal Problem:   Fusion of lumbar spine     Functional Problem List: Nursing Safety, Bladder, Bowel, Skin Integrity, Edema, Endurance, Medication Management, Motor, Pain  PT Balance, Pain, Endurance, Motor  OT Balance, Endurance  SLP    TR         Basic ADL's: OT Eating, Grooming, Bathing, Dressing, Toileting     Advanced  ADL's: OT       Transfers: PT Bed Mobility, Bed to Chair, Car  OT Toilet, Tub/Shower     Locomotion: PT Ambulation, Wheelchair Mobility     Additional Impairments: OT    SLP        TR      Anticipated Outcomes Item Anticipated Outcome  Self Feeding Mod I  Swallowing      Basic self-care  Mod I  Toileting  Mod I   Bathroom Transfers Mod I  Bowel/Bladder  manage bowel/bladder independenlty  Transfers  Mod I  Locomotion  Mod I  Communication     Cognition     Pain  less than 4  Safety/Judgment  no falls   Therapy Plan: PT Intensity: Minimum of 1-2 x/day ,45 to 90 minutes PT Frequency: 5 out of 7 days PT Duration Estimated Length of Stay: 5-7 days OT Intensity: Minimum of 1-2 x/day, 45 to 90 minutes OT Frequency: 5 out of 7 days OT Duration/Estimated Length of Stay: 7-10 days     Team Interventions: Nursing Interventions Patient/Family Education, Medication Management, Bladder Management, Bowel Management, Skin Care/Wound Management, Disease Management/Prevention, Pain Management, Discharge Planning  PT interventions Ambulation/gait training, Discharge planning, DME/adaptive equipment instruction, Functional mobility training, Pain management, Psychosocial support, Therapeutic Activities, UE/LE Strength taining/ROM, Wheelchair propulsion/positioning, UE/LE Coordination activities, Therapeutic Exercise, Stair training, Patient/family education, Neuromuscular  re-education, Community reintegration, Warden/ranger, Disease management/prevention  OT Interventions Warden/ranger, Discharge planning, Self Care/advanced ADL retraining, Therapeutic Activities, Pain management, UE/LE Coordination activities, Functional mobility training, Patient/family education, Therapeutic Exercise, DME/adaptive equipment instruction, UE/LE Strength taining/ROM  SLP Interventions    TR Interventions    SW/CM Interventions Discharge Planning, Psychosocial Support, Patient/Family Education   Barriers to Discharge MD  Medical stability, Home enviroment access/loayout, Wound care, Lack of/limited family support, Weight, and Weight bearing restrictions  Nursing Decreased caregiver support, Home environment access/layout, Incontinence, Neurogenic Bowel & Bladder, Wound Care, Weight, Other (comments) (back precautions) 1 level 2 ste entry no rails  PT Home environment access/layout, Decreased caregiver support discharge to son's house  OT      SLP      SW       Team Discharge Planning: Destination: PT-Home ,OT- Home , SLP-  Projected Follow-up: PT-Home health PT, OT-  Home health OT, SLP-  Projected Equipment Needs: PT-To be determined, OT- To be determined, SLP-  Equipment Details: PT-owns RW, walking stick and w/c, OT-Pt owns 3-1 commode, W/C, RW, grab bars in shower, TTB Patient/family involved in discharge planning: PT- Patient,  OT-Patient, SLP-   MD ELOS: 7-10 days Medical Rehab Prognosis:  Good Assessment: The patient has been admitted for CIR therapies with the diagnosis of lumbar fusion due to stenosis with muscle spasms. The team will be addressing functional mobility, strength, stamina, balance, safety, adaptive techniques and equipment, self-care, bowel and bladder mgt, patient and caregiver education, . Goals have been set at mod I. Anticipated discharge destination is home.  See Team Conference Notes for weekly updates  to the plan of care

## 2022-10-10 NOTE — Progress Notes (Signed)
Physical Therapy Session Note  Patient Details  Name: Dwayne Nelson MRN: [id number] Date of Birth: 1955/10/11  Today's Date: 10/10/2022 PT Individual Time: 0805-0832 PT Individual Time Calculation (min): 27 min   Short Term Goals: Week 1:  PT Short Term Goal 1 (Week 1): STG=LTG's due to ELOS  Skilled Therapeutic Interventions/Progress Updates:     Pt presents in room in bed, agreeable to PT. Pt denies pain at this time but does endorse pain throughout session during and following ambulation that subsides with sitting in WC. RN communicated with during session who reports will follow up with medication administration. Session focused on bed mobility, self care tasks, and gait training with rollator.  Pt completes bed mobility via log roll and semi elevated HOB supervision. Pt completes donning shirt and corset brace with supervision seated EOB. Pt completes sit>stand with increased time and remains standing for min assist with adjusting pants. Pt ambulates 43' with rollator CGA from room to day room manages opening door towards himself with unilateral UE support and multiple attempts, parks rollator against wall with verbal cues for placement. Pt demonstrating forward trunk flexion and increased cadence, short step length noted bilaterally. Pt requesting to take seated rest break on rollator with verbal cues for parking rollator against wall, locks brakes without cueing and turns to sit on rollator with CGA increased time and maintains spinal precautions. Pt requires extended seated rest break secondary to pt reporting increased BLE spasms with notable outbursts of pain. Pt requests to return to room seated in [facility], therapist brings WC from pt's room and pt stands, completes turn to place BUEs on rollator with verbal cues for spinal precautions, and turns to sit on WC.  Pt transported back to room dependently, reporting improved BLE symptoms with sitting in WC. Pt remains seated with all needs within  reach and call light in place at end of session.  Therapy Documentation Precautions:  Precautions Precautions: Fall, Back Required Braces or Orthoses: Spinal Brace Spinal Brace: Lumbar corset, Applied in sitting position Restrictions Weight Bearing Restrictions: No   Therapy/Group: Individual Therapy  Edwin Cap PT, DPT 10/10/2022, 8:33 AM

## 2022-10-10 NOTE — Progress Notes (Signed)
Met with patient. Verified that seeing MD for DMII management. Discussed diet modifications (recent weight loss). Record keeping of blood pressure and blood sugars.  Also discussed ways to improve LDL levels. Provided resource sheets for record keeping at home.  Discussed pain management and muscle relaxants. Also said that was able to get muscle release after message during therapy.  Asked if can bring in a tennis ball so that can pinpoint the area to work on at home, will help with the pain.  Bladder emptying and bowels working.  Left educational binder on dresser and when gets discharge paperwork to place in binder so that will have all materials in one spot.  Informed that discharge paperwork will include discharge medications, when to take medications, follow up appointments, and contact numbers company supplying equipment for home and therapy contact information if getting services for home/out patient. All needs met, call bell in reach. Bed alarm activated for safety.

## 2022-10-10 NOTE — Progress Notes (Signed)
Patient ID: Dwayne Nelson, male   DOB: 10/08/1955, 67 y.o.   MRN: [id number]  Met with pt to give him the team conference update regarding goals of mod/I and discharge ate of 7/22. He is happy with this and feels he will be ready to go home. He is going to his son's home and then eventually to his own. He has his equipment, can not get a rollator covered by insurance due to has a rolling walker from past month. He feels he has access to one or will buy one on-line. Will give [facility] update on discharge and get them an order for follow up.

## 2022-10-11 DIAGNOSIS — M7918 Myalgia, other site: Secondary | ICD-10-CM

## 2022-10-11 LAB — GLUCOSE, CAPILLARY
Glucose-Capillary: 104 mg/dL — ABNORMAL HIGH (ref 70–99)
Glucose-Capillary: 91 mg/dL (ref 70–99)
Glucose-Capillary: 98 mg/dL (ref 70–99)
Glucose-Capillary: 117 mg/dL — ABNORMAL HIGH (ref 70–99)

## 2022-10-11 MED ORDER — METHOCARBAMOL 500 MG PO TABS
1000.0000 mg | ORAL_TABLET | Freq: Four times a day (QID) | ORAL | Status: DC
  Administered 2022-10-11 – 2022-10-14 (×13): 1000 mg via ORAL
  Filled 2022-10-11 (×12): qty 2

## 2022-10-11 NOTE — Progress Notes (Signed)
Occupational Therapy Discharge Summary  Patient Details  Name: Dwayne Nelson MRN: [id number] Date of Birth: May 15, 1955  Date of Discharge from OT service:{Time; dates multiple:304500300}   Patient has met 8 of 8 long term goals due to {due VZ:5638756}.  Pt made steady progress with BADLs and functional transfers. Pt is mod I for bahting/dressing and toileting tasks using AE appropirately. Pt can don/doff LSO independently. Pt's family has not been present for educaiton. Patient to discharge at overall {LOA:3049010} level.  Patient's care partner {care partner:3041650} to provide the necessary {assistance:3041652} assistance at discharge.    Reasons goals not met: ***  Recommendation:  Patient will benefit from ongoing skilled OT services in {setting:3041680} to continue to advance functional skills in the area of {ADL/iADL:3041649}.  Equipment: No equipment provided  Reasons for discharge: {Reason for discharge:3049018}  Patient/family agrees with progress made and goals achieved: {Pt/Family agree with progress/goals:3049020}  OT Discharge ADL ADL Equipment Provided: Long-handled sponge Eating: Independent Grooming: Independent Where Assessed-Grooming: Sitting at sink Upper Body Bathing: Modified independent Where Assessed-Upper Body Bathing: Shower Lower Body Bathing: Modified independent Where Assessed-Lower Body Bathing: Chair Upper Body Dressing: Modified independent (Device) Where Assessed-Upper Body Dressing: Chair Lower Body Dressing: Modified independent Where Assessed-Lower Body Dressing: Chair Toileting: Modified independent Where Assessed-Toileting: Teacher, adult education: Engineer, agricultural Method: Acupuncturist: Insurance underwriter: Modified independent Film/video editor Method: Designer, industrial/product: Emergency planning/management officer, Grab  bars Vision Baseline Vision/History: 1 Wears glasses Patient Visual Report: No change from baseline Vision Assessment?: No apparent visual deficits Perception  Perception: Within Functional Limits Praxis Praxis: Intact Cognition Cognition Overall Cognitive Status: Within Functional Limits for tasks assessed Arousal/Alertness: Awake/alert Orientation Level: Person;Place;Situation Person: Oriented Place: Oriented Situation: Oriented Memory: Appears intact Awareness: Appears intact Problem Solving: Appears intact Brief Interview for Mental Status (BIMS) Repetition of Three Words (First Attempt): 3 Temporal Orientation: Year: Correct Temporal Orientation: Month: Accurate within 5 days Temporal Orientation: Day: Correct Recall: "Sock": Yes, no cue required Recall: "Blue": Yes, no cue required Recall: "Bed": Yes, no cue required BIMS Summary Score: 15 Sensation Sensation Light Touch: Appears Intact Hot/Cold: Appears Intact Proprioception: Appears Intact Stereognosis: Not tested Motor  Motor Motor: Other (comment) Motor - Skilled Clinical Observations: generalized weakness Trunk/Postural Assessment  Cervical Assessment Cervical Assessment: Within Functional Limits Thoracic Assessment Thoracic Assessment: Within Functional Limits Lumbar Assessment Lumbar Assessment: Exceptions to WFL (LSO) Postural Control Postural Control: Within Functional Limits  Balance Static Sitting Balance Static Sitting - Balance Support: Feet supported Static Sitting - Level of Assistance: 7: Independent Dynamic Sitting Balance Dynamic Sitting - Balance Support: During functional activity Dynamic Sitting - Level of Assistance: 6: Modified independent (Device/Increase time) Extremity/Trunk Assessment RUE Assessment RUE Assessment: Within Functional Limits LUE Assessment LUE Assessment: Within Functional Limits   Rich Brave 10/11/2022, 2:29 PM

## 2022-10-11 NOTE — Progress Notes (Signed)
Patient ID: Dwayne Nelson, male   DOB: 05/11/1955, 67 y.o.   MRN: [id number]  Spoke with son per his request to confirm his Dad will be ready to go home on Monday. He will be mod/I and mobile. Informed him of home health coming out also. Offered to have MD call him, he reports just wanted to confirm how mobile he will be.,

## 2022-10-11 NOTE — Plan of Care (Signed)
  Problem: Consults Goal: RH SPINAL CORD INJURY PATIENT EDUCATION Description:  See Patient Education module for education specifics.  Outcome: Progressing   Problem: SCI BOWEL ELIMINATION Goal: RH STG MANAGE BOWEL WITH ASSISTANCE Description: STG Manage Bowel with min/Mod I Assistance. Outcome: Progressing Goal: RH STG SCI MANAGE BOWEL WITH MEDICATION WITH ASSISTANCE Description: STG SCI Manage bowel with medication with min assistance. Outcome: Progressing   Problem: SCI BLADDER ELIMINATION Goal: RH STG MANAGE BLADDER WITH ASSISTANCE Description: STG Manage Bladder With min/mod I Assistance Outcome: Progressing Goal: RH STG SCI MANAGE BLADDER PROGRAM W/ASSISTANCE Description: Toilet every 2-3 hours to regain control of bladder with min assist  Outcome: Progressing   Problem: RH SKIN INTEGRITY Goal: RH STG SKIN FREE OF INFECTION/BREAKDOWN Description: Monitor incision to back and wound to penis daily and adhere to dressing changes to prevent infection/breakdown with min assist Outcome: Progressing Goal: RH STG MAINTAIN SKIN INTEGRITY WITH ASSISTANCE Description: STG Maintain Skin Integrity With min Assistance. Outcome: Progressing Goal: RH STG ABLE TO PERFORM INCISION/WOUND CARE W/ASSISTANCE Description: STG Able To Perform Incision/Wound Care With min/mod I Assistance. Outcome: Progressing   Problem: RH SAFETY Goal: RH STG ADHERE TO SAFETY PRECAUTIONS W/ASSISTANCE/DEVICE Description: STG Adhere to Safety Precautions With cueing Assistance/Device. Outcome: Progressing Goal: RH STG DECREASED RISK OF FALL WITH ASSISTANCE Description: STG Decreased Risk of Fall With min Assistance. Outcome: Progressing   Problem: RH PAIN MANAGEMENT Goal: RH STG PAIN MANAGED AT OR BELOW PT'S PAIN GOAL Description: Pain will be managed 4 out of 10 on pain scale with PRN medications min assist  Outcome: Progressing   Problem: RH KNOWLEDGE DEFICIT SCI Goal: RH STG INCREASE KNOWLEDGE OF SELF  CARE AFTER SCI Description: Patient/caregiver will be able to manage medications, skin/wound care, and manage bowel/bladder independently from nursing education, nursing handouts, and other resources.  Outcome: Progressing

## 2022-10-11 NOTE — Progress Notes (Addendum)
Physical Therapy Session Note  Patient Details  Name: Dwayne Nelson MRN: [id number] Date of Birth: [birth year]  Today's Date: 10/11/2022 PT Individual Time: 1020-1045 PT Individual Time Calculation (min): 25 min   Short Term Goals: Week 1:  PT Short Term Goal 1 (Week 1): STG=LTG's due to ELOS  Skilled Therapeutic Interventions/Progress Updates: Patient exiting bathroom in rollator with NT present on entrance to room. Patient alert and agreeable to PT session. Pt ambulated to bed with supervision for safety, and performed sit from EOB to supine with supervision while maintaning spinal precautions (pt recalled 3/3 precautions). Pt rolled to L side via log roll with no cues required throughout.   Patient reported no unrated pain in R glute. Trigger point release and sciatic nerve glide initiated this session to decrease pt's pain. PTA performed trigger point release with elbow around R glute musculatures (L sidelying in bed), and had pt follow up afterwards with sitting EOB and performing piriformis stretch bilaterally (2 x 15- 20 second holds). Pt then instructed to perform short sitting sciatic nerve glides in R LE after PTA provided demonstration. Pt reported decrease in pain at end of session. Pt denied soreness of use of ice pack on glutes.   Patient supine in bed at end of session with brakes locked, bed alarm set, and all needs within reach.      Therapy Documentation Precautions:  Precautions Precautions: Fall, Back Required Braces or Orthoses: Spinal Brace Spinal Brace: Lumbar corset, Applied in sitting position Restrictions Weight Bearing Restrictions: No  Therapy/Group: Individual Therapy  Gennesis Hogland PTA 10/11/2022, 12:37 PM

## 2022-10-11 NOTE — Progress Notes (Signed)
Occupational Therapy Session Note  Patient Details  Name: Dwayne Nelson MRN: [id number] Date of Birth: 1955/05/04  Today's Date: 10/11/2022 OT Individual Time: 1045-1200 OT Individual Time Calculation (min): 75 min    Short Term Goals: Week 1:  OT Short Term Goal 1 (Week 1): STG=LTG d/t ELOS  Skilled Therapeutic Interventions/Progress Updates:    Pt resting in bed upon arrival and agreeable to therapy. Initial focus on bed mobility, functional amb with Rollator, and standing balance. Sit>supine EOB with supervision. Pt dons LSO with supervision. Pt amb with Rollator to elevators and sat in std chair to rest. Pt amb to gym and stood to play a game of corn hole. Pt used reacher to retireve bags from floor. After rest, pt amb with Rollator back to room with rest break X 1. Focus shifted to BADLs at shower level. Pt completed bathing/dressing with sit<>stand at shower level. Pt at supervision level throughout session. Pt returned to room and sat EOB. Pt remained EOB with bed alarm activated. All needs within reach.   Therapy Documentation Precautions:  Precautions Precautions: Fall, Back Required Braces or Orthoses: Spinal Brace Spinal Brace: Lumbar corset, Applied in sitting position Restrictions Weight Bearing Restrictions: No   Pain:  Pt c/o 4/10 "back"/piriformis pain; repositioned and warm shower Therapy/Group: Individual Therapy  Rich Brave 10/11/2022, 12:08 PM

## 2022-10-11 NOTE — Progress Notes (Addendum)
Subjective: Moderate help with robaxin 750 mg- also Sinclair did myofacial release-  Much less pain this am. Exam: Awake, alert, appropriate, laying supine in bed, NAD RRR Ctabl  Soft, nd, NT, hypoactive BS Drainage not on external dressing -c/d/i Trigger points low back/ buttocks Plan: Cont CIR Sinclair- use tennis ball or theracane  Increase robaxin to 1000 mg QID Cont opiates- with goal to decrease usage over weekend  Dc Monday     I spent a total of 36   minutes on total care today- >50% coordination of care- due to  D/w Gaye Pollack about treamtent and with pt about how to get TrP injections if needed

## 2022-10-11 NOTE — Progress Notes (Signed)
Physical Therapy Session Note  Patient Details  Name: Dwayne Nelson MRN: [id number] Date of Birth: 12-29-1955  Today's Date: 10/11/2022 PT Individual Time: [phone] and 1445-1530 PT Individual Time Calculation (min): 30 min and 45 min  Short Term Goals: Week 1:  PT Short Term Goal 1 (Week 1): STG=LTG's due to ELOS  Skilled Therapeutic Interventions/Progress Updates: Tx1: Pt presented in bed with nsg present administering meds agreeable to therapy. Pt denies pain at rest but increased significantly during session. Once received meds, pt completed supine to sit with supervision and use of bed features. Pt donned shirt with set up and donned LSO with set up maintaining spinal precautions. Pt then ambulated with rollator to bathroom demonstrating good safety with rollator and performed toilet transfers with supervision. Pt with continent urinary void at toilet. Pt returned to sink once completed and locked rollator and turned to sit at sink. At sink pt completed oral hygiene and washed face. Pt expressing that piriformis pain has increased since gotten up. Pt noted to at times lock when pain/?spasm occurs. Discussed with pt and he expressed when this occurs difficult to complete tasks. With increased time pt transferred back to bed, doffed LSO and completed transfer to supine with supervision. Pt left in bed at end of session with bed alarm on, call bell within reach and needs met. Pt missed 25 min skilled PT due to pain/fatigue.   Tx2: Pt presented in bed agreeable to therapy. Pt denies pain at rest, increased piriformis pain with mobility with lessened with rest. Performed supine to sit with use of bed rail and supervision. Pt donned LSO with set up assist. Pt stood and ambulated to bathroom with supervision with pt performing toilet transfers with supervision for continent urinary void. Pt then transferred to w/c and transported to [facility] entrance for ambulation in community setting with rollator. Pt  ambulated several bouts of distances between 100-231ft with RW and CGA. Pt demonstrating good safety with rollator and ambulating with decreased self selected gait speed with narrow BOS and decreased B foot clearance. Pt was able to sit at rollator as well as on benches and stand with supervision. Pt also practiced ambulating through entrance with varying surfaces and over thresholds and mat. Pt was transported back to room at end of session and performed ambulatory transfer to bed. PT was able to doff LSO independently and complete sit to supine transfer with supervision and use of bed rail. Pt left in bed at end of session with bed alarm on, call bell within reach and needs met.      Therapy Documentation Precautions:  Precautions Precautions: Fall, Back Required Braces or Orthoses: Spinal Brace Spinal Brace: Lumbar corset, Applied in sitting position Restrictions Weight Bearing Restrictions: No General: PT Amount of Missed Time (min): 30 Minutes PT Missed Treatment Reason: Pain   Therapy/Group: Individual Therapy  Jaleil Renwick 10/11/2022, 12:39 PM

## 2022-10-12 LAB — GLUCOSE, CAPILLARY
Glucose-Capillary: 90 mg/dL (ref 70–99)
Glucose-Capillary: 125 mg/dL — ABNORMAL HIGH (ref 70–99)
Glucose-Capillary: 139 mg/dL — ABNORMAL HIGH (ref 70–99)

## 2022-10-12 MED ORDER — BACLOFEN 5 MG HALF TABLET: 5.0000 mg | ORAL_TABLET | Freq: Three times a day (TID) | ORAL | Status: DC | PRN

## 2022-10-12 NOTE — Plan of Care (Signed)
  Problem: Consults Goal: RH SPINAL CORD INJURY PATIENT EDUCATION Description:  See Patient Education module for education specifics.  Outcome: Progressing   Problem: SCI BOWEL ELIMINATION Goal: RH STG MANAGE BOWEL WITH ASSISTANCE Description: STG Manage Bowel with min/Mod I Assistance. Outcome: Progressing Goal: RH STG SCI MANAGE BOWEL WITH MEDICATION WITH ASSISTANCE Description: STG SCI Manage bowel with medication with min assistance. Outcome: Progressing   Problem: SCI BLADDER ELIMINATION Goal: RH STG MANAGE BLADDER WITH ASSISTANCE Description: STG Manage Bladder With min/mod I Assistance Outcome: Progressing Goal: RH STG SCI MANAGE BLADDER PROGRAM W/ASSISTANCE Description: Toilet every 2-3 hours to regain control of bladder with min assist  Outcome: Progressing   Problem: RH SKIN INTEGRITY Goal: RH STG SKIN FREE OF INFECTION/BREAKDOWN Description: Monitor incision to back and wound to penis daily and adhere to dressing changes to prevent infection/breakdown with min assist Outcome: Progressing Goal: RH STG MAINTAIN SKIN INTEGRITY WITH ASSISTANCE Description: STG Maintain Skin Integrity With min Assistance. Outcome: Progressing Goal: RH STG ABLE TO PERFORM INCISION/WOUND CARE W/ASSISTANCE Description: STG Able To Perform Incision/Wound Care With min/mod I Assistance. Outcome: Progressing   Problem: RH SAFETY Goal: RH STG ADHERE TO SAFETY PRECAUTIONS W/ASSISTANCE/DEVICE Description: STG Adhere to Safety Precautions With cueing Assistance/Device. Outcome: Progressing Goal: RH STG DECREASED RISK OF FALL WITH ASSISTANCE Description: STG Decreased Risk of Fall With min Assistance. Outcome: Progressing   Problem: RH PAIN MANAGEMENT Goal: RH STG PAIN MANAGED AT OR BELOW PT'S PAIN GOAL Description: Pain will be managed 4 out of 10 on pain scale with PRN medications min assist  Outcome: Progressing   Problem: RH KNOWLEDGE DEFICIT SCI Goal: RH STG INCREASE KNOWLEDGE OF SELF  CARE AFTER SCI Description: Patient/caregiver will be able to manage medications, skin/wound care, and manage bowel/bladder independently from nursing education, nursing handouts, and other resources.  Outcome: Progressing

## 2022-10-12 NOTE — Progress Notes (Signed)
PROGRESS NOTE   Subjective/Complaints:  Per yesterday's note: Dwayne Nelson- use tennis ball or theracane  Increase robaxin to 1000 mg QID Cont opiates- with goal to decrease usage over weekend  No events overnight. Improved pain cointrol with increasd robaxin, however remains bothersome in calves especially at nighttime, causing early waking up and increased pain in AM.   Vitals stable Last BM 7/19  ROS:  Pt denies SOB, abd pain, CP, N/V/C/D, and vision changes  Objective:   No results found. No results for input(s): "WBC", "HGB", "HCT", "PLT" in the last 72 hours.  Recent Labs    10/10/22 0640  NA 132*  K 3.5  CL 98  CO2 27  GLUCOSE 67*  BUN 22  CREATININE 1.05  CALCIUM 8.8*    Intake/Output Summary (Last 24 hours) at 10/12/2022 0949 Last data filed at 10/12/2022 0945 Gross per 24 hour  Intake 358 ml  Output 1450 ml  Net -1092 ml        Physical Exam: Vital Signs Blood pressure (!) 113/99, pulse (!) 56, temperature 98 F (36.7 C), temperature source Oral, resp. rate 16, height 5\' 10"  (1.778 m), weight 105.7 kg, SpO2 97%.   General: awake, alert, appropriate. Laying reclined in bed.  HENT: conjugate gaze; oropharynx moist CV: regular  rhythm, bradycardic rate; no JVD Pulmonary: CTA B/L; no W/R/R- good air movement GI: soft, NT, ND, (+)BS- normoactive Psychiatric: appropriate- interactive Neurological: Ox3. No apparent deficits. NO spasticity on testing.   Prior exams: Skin: small amount of nickel sized sanguinous drainage on dressing- incision looks great- mild swelling but no erythema Neurologic: Alert and oriented x 3 motor strength is 5/5 in bilateral deltoid, bicep, tricep, grip, 4/5 bilateral hip flexor, knee extensors, ankle dorsiflexor and plantar flexor Sensory exam normal sensation to light touch bilateral upper and lower extremities Tone is normal in bilateral lower  extremities Musculoskeletal: Full range of motion in all 4 extremities. No joint swelling  Assessment/Plan: 1. Functional deficits which require 3+ hours per day of interdisciplinary therapy in a comprehensive inpatient rehab setting. Physiatrist is providing close team supervision and 24 hour management of active medical problems listed below. Physiatrist and rehab team continue to assess barriers to discharge/monitor patient progress toward functional and medical goals  Care Tool:  Bathing    Body parts bathed by patient: Right arm, Right lower leg, Left lower leg, Left arm, Chest, Face, Abdomen, Front perineal area, Buttocks, Right upper leg, Left upper leg         Bathing assist Assist Level: Supervision/Verbal cueing     Upper Body Dressing/Undressing Upper body dressing   What is the patient wearing?: Pull over shirt, Orthosis    Upper body assist Assist Level: Independent with assistive device    Lower Body Dressing/Undressing Lower body dressing      What is the patient wearing?: Underwear/pull up, Pants     Lower body assist Assist for lower body dressing: Supervision/Verbal cueing     Toileting Toileting    Toileting assist Assist for toileting: Supervision/Verbal cueing     Transfers Chair/bed transfer  Transfers assist     Chair/bed transfer assist level: Contact Guard/Touching assist  Locomotion Ambulation   Ambulation assist      Assist level: Contact Guard/Touching assist Assistive device: Walker-rolling Max distance: 150 ft   Walk 10 feet activity   Assist     Assist level: Contact Guard/Touching assist Assistive device: Walker-rolling   Walk 50 feet activity   Assist    Assist level: Contact Guard/Touching assist Assistive device: Walker-rolling    Walk 150 feet activity   Assist    Assist level: Contact Guard/Touching assist Assistive device: Walker-rolling    Walk 10 feet on uneven surface   activity   Assist     Assist level: Contact Guard/Touching assist Assistive device: Walker-rolling   Wheelchair     Assist Is the patient using a wheelchair?: Yes Type of Wheelchair: Manual    Wheelchair assist level: Supervision/Verbal cueing Max wheelchair distance: >400 ft    Wheelchair 50 feet with 2 turns activity    Assist        Assist Level: Supervision/Verbal cueing   Wheelchair 150 feet activity     Assist      Assist Level: Supervision/Verbal cueing   Blood pressure (!) 113/99, pulse (!) 56, temperature 98 F (36.7 C), temperature source Oral, resp. rate 16, height 5\' 10"  (1.778 m), weight 105.7 kg, SpO2 97%.  Medical Problem List and Plan: 1. Functional deficits secondary to lumbar spondylolisthesis with lumbar stenosis              -patient may  shower             -ELOS/Goals: 7-10d, Mod I   D/c 7/22  IPOC today Con't CIR PT and OT 2.  Antithrombotics: -DVT/anticoagulation:  Pharmaceutical: Heparin             -antiplatelet therapy: Aspirin 81 mg daily   3. Pain Management: Tylenol as needed             -Continue gabapentin 300 mg 3 times daily   7/16- also has Percocet q4 hours- says brings pain down to 2-3/10 with rest- asked pt to ask for pain meds prior to therapy.   7/17- taking q4-5 hours prn- to keep pain under control- wil add Tramadol 50 mg q6 hours to try and calm the pain in  the time in between meds  7/18- Will d/c tramadol- not helpful- will increase robaxin to 750 mg QID and if doesn't help enough, will do trigger point injections Tuesday.    - 7/20: See above ; Robaxin increased to 1000 mg QID yestyerday, add PRN baclofen 5 mg today. Only using percocet BID, will monitor for continued wean.   4. Mood/Behavior/Sleep: LCSW to evaluate and provide emotional support             -antipsychotic agents: n/a   5. Neuropsych/cognition: This patient is capable of making decisions on his own behalf.   6. Skin/Wound Care: Routine  skin care checks   7/16- incision looks OK- con't regimen 7. Fluids/Electrolytes/Nutrition: Routine Is and Os and follow-up chemistries   8: Hypertension: monitor TID and prn             -Continue amlodipine 5 mg daily             -Continue metoprolol succinate 50 mg nightly             -Continue benazepril 20 mg daily             -Continue hydrochlorothiazide 25 mg daily   7/18- BP controlled- con't regimen- HR 54 this  AM- but usually 70's- will maintain Metoprolol dose.  - well controlled  Monitor    10/12/2022    7:24 PM 10/12/2022   12:39 PM 10/12/2022    5:28 AM  Vitals with BMI  Systolic 116 99 113  Diastolic 72 65 99  Pulse 73 61 56     9: Hyperlipidemia: continue statin   10: DM: CBGs 4 times daily; A1c = 7.5% (home = Jardiance, Soliqua,metformin, Actos)             -Continue Jardiance 25 mg daily             -Continue Semglee 30 units twice daily             -Continue metformin 1000 mg twice daily             -Continue Actos 30 mg nightly   7/16- CBGs somewhat labile- 92-242 in last 24 hours- con't regimen and monitor trend to see if needs to make changes.   7/17- will reduce Semglee to 25 units BID- due to hypoglycemia this AM- will only give 15 units this AM and hold Jardiance this Am- give Metformin.   7/17- reduced Semglee to 20 units BID- for hypoglycemia event this AM- to 67- and levels really controlled- really too controlled   - well controlled Recent Labs    10/12/22 1127 10/12/22 1636 10/12/22 2057  GLUCAP 90 125* 139*      11. Azotemia  7/16- BUN 26- will encourage fluids and recheck Thursday  7/18- BUN down to 22  12. Hematoma  7/18- more drainage from incision yesterday- but looks much better this AM.   LOS: 5 days A FACE TO FACE EVALUATION WAS PERFORMED  Angelina Sheriff 10/12/2022, 9:49 AM

## 2022-10-13 LAB — GLUCOSE, CAPILLARY
Glucose-Capillary: 133 mg/dL — ABNORMAL HIGH (ref 70–99)
Glucose-Capillary: 131 mg/dL — ABNORMAL HIGH (ref 70–99)
Glucose-Capillary: 182 mg/dL — ABNORMAL HIGH (ref 70–99)
Glucose-Capillary: 137 mg/dL — ABNORMAL HIGH (ref 70–99)

## 2022-10-13 MED ORDER — OXYCODONE-ACETAMINOPHEN 5-325 MG PO TABS: 1.0000 | ORAL_TABLET | Freq: Two times a day (BID) | ORAL | Status: DC | PRN

## 2022-10-13 NOTE — Plan of Care (Signed)
Pt progressing with plan of care.

## 2022-10-13 NOTE — Progress Notes (Signed)
PROGRESS NOTE   Subjective/Complaints:  No events overnight. No acute complains. No questions regarding DC in AM tomorrow.  Vitals stable  ROS:  Pt denies SOB, abd pain, CP, N/V/C/D, and vision changes  Objective:   No results found. No results for input(s): "WBC", "HGB", "HCT", "PLT" in the last 72 hours.  No results for input(s): "NA", "K", "CL", "CO2", "GLUCOSE", "BUN", "CREATININE", "CALCIUM" in the last 72 hours.   Intake/Output Summary (Last 24 hours) at 10/13/2022 2110 Last data filed at 10/13/2022 2023 Gross per 24 hour  Intake 896 ml  Output 2000 ml  Net -1104 ml        Physical Exam: Vital Signs Blood pressure 105/60, pulse 69, temperature 98.9 F (37.2 C), temperature source Oral, resp. rate 18, height 5\' 10"  (1.778 m), weight 105.7 kg, SpO2 94%.   General: awake, alert, appropriate. Reclining in room HENT: conjugate gaze; oropharynx moist CV: regular  rhythm, bradycardic rate; no JVD Pulmonary: CTA B/L; no W/R/R- good air movement GI: soft, NT, ND, (+)BS- normoactive Psychiatric: appropriate- interactive Neurological: Ox3. No apparent deficits. NO spasticity on testing.  Neurologic: Alert and oriented x 3  Antigravity and against resistance all extremities Sensory exam normal sensation to light touch bilateral upper and lower extremities Tone is normal in bilateral lower extremities Musculoskeletal: Full range of motion in all 4 extremities. No joint swelling  Prior exams: Skin: small amount of nickel sized sanguinous drainage on dressing- incision looks great- mild swelling but no erythema  Assessment/Plan: 1. Functional deficits which require 3+ hours per day of interdisciplinary therapy in a comprehensive inpatient rehab setting. Physiatrist is providing close team supervision and 24 hour management of active medical problems listed below. Physiatrist and rehab team continue to assess barriers  to discharge/monitor patient progress toward functional and medical goals  Care Tool:  Bathing    Body parts bathed by patient: Right arm, Right lower leg, Left lower leg, Left arm, Chest, Face, Abdomen, Front perineal area, Buttocks, Right upper leg, Left upper leg         Bathing assist Assist Level: Supervision/Verbal cueing     Upper Body Dressing/Undressing Upper body dressing   What is the patient wearing?: Pull over shirt, Orthosis    Upper body assist Assist Level: Independent with assistive device    Lower Body Dressing/Undressing Lower body dressing      What is the patient wearing?: Underwear/pull up, Pants     Lower body assist Assist for lower body dressing: Supervision/Verbal cueing     Toileting Toileting    Toileting assist Assist for toileting: Supervision/Verbal cueing     Transfers Chair/bed transfer  Transfers assist     Chair/bed transfer assist level: Independent with assistive device     Locomotion Ambulation   Ambulation assist      Assist level: Independent with assistive device Assistive device: Rollator Max distance: 200 ft   Walk 10 feet activity   Assist     Assist level: Independent with assistive device Assistive device: Rollator   Walk 50 feet activity   Assist    Assist level: Independent with assistive device Assistive device: Rollator    Walk 150 feet activity  Assist    Assist level: Independent with assistive device Assistive device: Rollator    Walk 10 feet on uneven surface  activity   Assist     Assist level: Independent with assistive device Assistive device: Rollator   Wheelchair     Assist Is the patient using a wheelchair?: No Type of Wheelchair: Manual    Wheelchair assist level: Supervision/Verbal cueing Max wheelchair distance: >400 ft    Wheelchair 50 feet with 2 turns activity    Assist        Assist Level: Supervision/Verbal cueing   Wheelchair 150  feet activity     Assist      Assist Level: Supervision/Verbal cueing   Blood pressure 105/60, pulse 69, temperature 98.9 F (37.2 C), temperature source Oral, resp. rate 18, height 5\' 10"  (1.778 m), weight 105.7 kg, SpO2 94%.  Medical Problem List and Plan: 1. Functional deficits secondary to lumbar spondylolisthesis with lumbar stenosis              -patient may  shower             -ELOS/Goals: 7-10d, Mod I   D/c 7/22  IPOC today Con't CIR PT and OT 2.  Antithrombotics: -DVT/anticoagulation:  Pharmaceutical: Heparin             -antiplatelet therapy: Aspirin 81 mg daily   3. Pain Management: Tylenol as needed             -Continue gabapentin 300 mg 3 times daily   7/16- also has Percocet q4 hours- says brings pain down to 2-3/10 with rest- asked pt to ask for pain meds prior to therapy.   7/17- taking q4-5 hours prn- to keep pain under control- wil add Tramadol 50 mg q6 hours to try and calm the pain in  the time in between meds  7/18- Will d/c tramadol- not helpful- will increase robaxin to 750 mg QID and if doesn't help enough, will do trigger point injections Tuesday.    - 7/20: See above ; Robaxin increased to 1000 mg QID yestyerday, add PRN baclofen 5 mg today. Only using percocet BID, will monitor for continued wean.   - 7/21: Only using percocet every other day, reduce to BID PRN, can probably DC without, defer to primary team  4. Mood/Behavior/Sleep: LCSW to evaluate and provide emotional support             -antipsychotic agents: n/a   5. Neuropsych/cognition: This patient is capable of making decisions on his own behalf.   6. Skin/Wound Care: Routine skin care checks   7/16- incision looks OK- con't regimen  7. Fluids/Electrolytes/Nutrition: Routine Is and Os and follow-up chemistries   8: Hypertension: monitor TID and prn             -Continue amlodipine 5 mg daily             -Continue metoprolol succinate 50 mg nightly             -Continue benazepril 20 mg  daily             -Continue hydrochlorothiazide 25 mg daily   7/18- BP controlled- con't regimen- HR 54 this AM- but usually 70's- will maintain Metoprolol dose.  - well controlled 7/20-21  Monitor    10/13/2022    8:22 PM 10/13/2022    2:48 PM 10/13/2022    5:40 AM  Vitals with BMI  Systolic 105 109 409  Diastolic 60 63 57  Pulse 69 62 56     9: Hyperlipidemia: continue statin   10: DM: CBGs 4 times daily; A1c = 7.5% (home = Jardiance, Soliqua,metformin, Actos)             -Continue Jardiance 25 mg daily             -Continue Semglee 30 units twice daily             -Continue metformin 1000 mg twice daily             -Continue Actos 30 mg nightly   7/16- CBGs somewhat labile- 92-242 in last 24 hours- con't regimen and monitor trend to see if needs to make changes.   7/17- will reduce Semglee to 25 units BID- due to hypoglycemia this AM- will only give 15 units this AM and hold Jardiance this Am- give Metformin.   7/17- reduced Semglee to 20 units BID- for hypoglycemia event this AM- to 67- and levels really controlled- really too controlled   - well controlled Recent Labs    10/13/22 0614 10/13/22 1136 10/13/22 1615  GLUCAP 133* 131* 182*      11. Azotemia  7/16- BUN 26- will encourage fluids and recheck Thursday  7/18- BUN down to 22  12. Hematoma  7/18- more drainage from incision yesterday- but looks much better this AM.   LOS: 6 days A FACE TO FACE EVALUATION WAS PERFORMED  Angelina Sheriff 10/13/2022, 9:10 PM

## 2022-10-13 NOTE — Progress Notes (Signed)
Inpatient Rehabilitation Discharge Medication Review by a Pharmacist  A complete drug regimen review was completed for this patient to identify any potential clinically significant medication issues.  High Risk Drug Classes Is patient taking? Indication by Medication  Antipsychotic {Receiving?:26196}   Anticoagulant {Receiving?:26196}   Antibiotic {Receiving?:26196}   Opioid {Receiving?:26196}   Antiplatelet {Receiving?:26196}   Hypoglycemics/insulin {Yes or No?:26198}   Vasoactive Medication {Receiving?:26196}   Chemotherapy {Receiving Chemo?:26197}   Other {Yes or No?:26198} Hydrochlorothiazide, metoprolol, amlodipine/benazepril - HTN Aspirin - *** Atorvastatin - HLD Cetirizine - allergies Cyclobenazprine - PRN muscle spasms Empagliflozin, metformin, pioglitazone, Soliqua (glargine/lixisenatide) - DM Gapabentin - neuropathy Testosterone - hormone therapy     Type of Medication Issue Identified Description of Issue Recommendation(s)  Drug Interaction(s) (clinically significant)     Duplicate Therapy     Allergy     No Medication Administration End Date     Incorrect Dose     Additional Drug Therapy Needed     Significant med changes from prior encounter (inform family/care partners about these prior to discharge). *** Communicate medication changes with patient/family at discharge  Other       Clinically significant medication issues were identified that warrant physician communication and completion of prescribed/recommended actions by midnight of the next day:  {Yes or No?:26198}  Name of provider notified for urgent issues identified: ***   Provider Method of Notification: ***    Pharmacist comments: ***   Time spent performing this drug regimen review (minutes): 20   Thank you for allowing pharmacy to be a part of this patient's care.  Thelma Barge, PharmD Clinical Pharmacist

## 2022-10-13 NOTE — Progress Notes (Signed)
Occupational Therapy Session Note  Patient Details  Name: Dwayne Nelson MRN: [id number] Date of Birth: 12/26/1955  Today's Date: 10/13/2022 OT Individual Time: 2725-3664 OT Individual Time Calculation (min): 41 min   Short Term Goals: Week 1:  OT Short Term Goal 1 (Week 1): STG=LTG d/t ELOS  Skilled Therapeutic Interventions/Progress Updates:  Pt received resting in bed for skilled OT session with focus on discharge planning and IADL participation. Pt agreeable to interventions, demonstrating overall pleasant mood. Pt with intermediate reports of R-buttock pain. OT offering intermediate rest breaks and positioning suggestions throughout session to address pain/fatigue and maximize participation/safety in session.   Pt comes to EOB with supervision + increased time.  Pt ambulates from room<>day room with supervision + rollator. In day room, pt and OT discuss upcoming discharge. Pt then participates in household keeping activity, as he was instructed to gather towels/washcloths placed at low level and transport them to washer/dryer. Pt completes activity with no more than supervision + rollator, requiring x2 seated rest breaks and cuing for lock rollator. Pt and OT then briefly discuss simple meal prepping, emphasis placed on the safe transportation of food using his rollator. Pt receptive to all education provided.   Pt remained resting in bed with all immediate needs met at end of session. Pt continues to be appropriate for skilled OT intervention to promote further functional independence.    Therapy Documentation Precautions:  Precautions Precautions: Fall, Back Required Braces or Orthoses: Spinal Brace Spinal Brace: Lumbar corset, Applied in sitting position Restrictions Weight Bearing Restrictions: No   Therapy/Group: Individual Therapy  Lou Cal, OTR/L, MSOT  10/13/2022, 1:32 PM

## 2022-10-13 NOTE — Progress Notes (Signed)
Physical Therapy Discharge Summary  Patient Details  Name: Dwayne Nelson MRN: [id number] Date of Birth: October 07, 1955  Date of Discharge from PT service:October 13, 2022  Today's Date: 10/13/2022 PT Individual Time: 1010-1108, 1449-1530  PT Individual Time Calculation (min): 58 min , 41 min    Patient has met 9 of 9 long term goals due to improved activity tolerance, improved balance, increased range of motion, decreased pain, ability to compensate for deficits, and improved coordination.  Patient to discharge at an ambulatory level Modified Independent.    Reasons goals not met: N/A   Recommendation:  Patient will benefit from ongoing skilled PT services in home health setting to continue to advance safe functional mobility, address ongoing impairments in activity tolerance, and minimize fall risk.  Equipment: Rollator   Reasons for discharge: treatment goals met and discharge from hospital  Patient/family agrees with progress made and goals achieved: Yes  PT Discharge Pain Interference Pain Interference Pain Effect on Sleep: 0. Does not apply - I have not had any pain or hurting in the past 5 days Pain Interference with Therapy Activities: 2. Occasionally Pain Interference with Day-to-Day Activities: 1. Rarely or not at all Cognition Overall Cognitive Status: Within Functional Limits for tasks assessed Arousal/Alertness: Awake/alert Orientation Level: Oriented X4 Memory: Appears intact Awareness: Appears intact Problem Solving: Appears intact Safety/Judgment: Appears intact Sensation Sensation Light Touch: Appears Intact Proprioception: Appears Intact Coordination Gross Motor Movements are Fluid and Coordinated: Yes Fine Motor Movements are Fluid and Coordinated: Yes Coordination and Movement Description: grossly coordinated limited due to muscle spasms Finger Nose Finger Test: [facility] Heel Shin Test: [facility] Motor  Motor Motor: Within Functional Limits Motor - Skilled  Clinical Observations: general deconditioning  Mobility Bed Mobility Bed Mobility: Rolling Right;Rolling Left;Sit to Supine;Supine to Sit Rolling Right: Independent with assistive device Rolling Left: Independent with assistive device Supine to Sit: Independent with assistive device Sit to Supine: Independent with assistive device Transfers Transfers: Sit to Stand;Stand to Sit;Stand Pivot Transfers Sit to Stand: Independent with assistive device Stand to Sit: Independent with assistive device Stand Pivot Transfers: Independent with assistive device Transfer (Assistive device): Rollator (RW + rollator) Locomotion  Gait Ambulation: Yes Gait Assistance: Independent with assistive device Gait Distance (Feet): 200 Feet Assistive device: Rollator Gait Gait: Yes Gait Pattern: Impaired Gait Pattern: Poor foot clearance - left;Poor foot clearance - right;Antalgic Gait velocity: decreased Stairs / Additional Locomotion Stairs: Yes Stairs Assistance: Independent with assistive device Stair Management Technique: Two rails Number of Stairs: 4 Height of Stairs: 6 (inches) Ramp: Independent with assistive device Curb: Independent with assistive Designer, multimedia: Yes Wheelchair Assistance: Independent with Scientist, research (life sciences): Both upper extremities Wheelchair Parts Management: Needs assistance Distance: >400 ft  Trunk/Postural Assessment  Cervical Assessment Cervical Assessment: Within Functional Limits Thoracic Assessment Thoracic Assessment: Within Functional Limits Lumbar Assessment Lumbar Assessment: Exceptions to [facility] (lumbar corset) Postural Control Postural Control: Within Functional Limits  Balance Balance Balance Assessed: Yes Static Sitting Balance Static Sitting - Balance Support: Feet supported Static Sitting - Level of Assistance: 7: Independent Dynamic Sitting Balance Dynamic Sitting - Balance Support: During  functional activity Dynamic Sitting - Level of Assistance: 6: Modified independent (Device/Increase time) Static Standing Balance Static Standing - Balance Support: Bilateral upper extremity supported Static Standing - Level of Assistance: 6: Modified independent (Device/Increase time) Dynamic Standing Balance Dynamic Standing - Balance Support: Bilateral upper extremity supported Dynamic Standing - Level of Assistance: 6: Modified independent (Device/Increase time) Dynamic Standing - Balance Activities:  Reaching for objects;Lateral lean/weight shifting;Forward lean/weight shifting Extremity Assessment  RLE Assessment RLE Assessment: Within Functional Limits General Strength Comments: Grossly 4+/5 LLE Assessment LLE Assessment: Within Functional Limits General Strength Comments: Grossly 4+/5   Treatment Session 1:   Pt received semi-reclined in bed, reporting right buttock pain. Pt requesting STM for pain relief. PT performed myofascial release, trigger point therapy and soft tissue mobilization to right piriformis. PT educated pt on sciatic nerve pain along with anatomy/physiology. Pt transitioned to gait training and mod I with bed mobility, transfers and gait >200 ft with rollator. Initially pt without reports of pain but intensity spiked following ambulation. PT provided pt with golf ball for trigger point relief as PT unable to locate thera cane. Pt reports some relief and ambulated to room and left semi-reclined in bed with all needs in reach and alarm on.   Treatment Session 2:   Pt received semi-reclined in bed with unrated buttock pain, provided activity modification and rest for management. Pt performed piriformis figure four stretch seated edge of bed 3 x 15 seconds bilaterally to address flexibility deficits. Pt transitioned to UE strength, ROM and activity tolerance exercises and performed following:   -body blade x 1 min (shoulder flexion, shoulder ER)   -AAROM shoulder  flexion, scaption, abduction 3 x 10   -L shoulder abduction from 60- 90 degrees 1 x 10   Pt left semi-reclined in bed with all needs in reach.    Truitt Leep Truitt Leep PT, DPT  10/13/2022, 10:22 AM

## 2022-10-14 ENCOUNTER — Other Ambulatory Visit (HOSPITAL_COMMUNITY): Payer: Self-pay

## 2022-10-14 LAB — BASIC METABOLIC PANEL
Anion gap: 7 (ref 5–15)
BUN: 29 mg/dL — ABNORMAL HIGH (ref 8–23)
CO2: 29 mmol/L (ref 22–32)
Calcium: 9 mg/dL (ref 8.9–10.3)
Chloride: 98 mmol/L (ref 98–111)
Creatinine, Ser: 1.19 mg/dL (ref 0.61–1.24)
GFR, Estimated: >60 mL/min (ref >60)
Glucose, Bld: 130 mg/dL — ABNORMAL HIGH (ref 70–99)
Potassium: 4.2 mmol/L (ref 3.5–5.1)
Sodium: 134 mmol/L — ABNORMAL LOW (ref 135–145)

## 2022-10-14 LAB — CBC
HCT: 42.5 % (ref 39.0–52.0)
Hemoglobin: 13.8 g/dL (ref 13.0–17.0)
MCH: 30.7 pg (ref 26.0–34.0)
MCHC: 32.5 g/dL (ref 30.0–36.0)
MCV: 94.4 fL (ref 80.0–100.0)
Platelets: 267 10*3/uL (ref 150–400)
RBC: 4.5 MIL/uL (ref 4.22–5.81)
RDW: 12.7 % (ref 11.5–15.5)
WBC: 6.3 10*3/uL (ref 4.0–10.5)
nRBC: 0 % (ref 0.0–0.2)

## 2022-10-14 LAB — GLUCOSE, CAPILLARY
Glucose-Capillary: 130 mg/dL — ABNORMAL HIGH (ref 70–99)
Glucose-Capillary: 118 mg/dL — ABNORMAL HIGH (ref 70–99)

## 2022-10-14 MED ORDER — SOLIQUA 100-33 UNT-MCG/ML ~~LOC~~ SOPN: 20.0000 [IU] | PEN_INJECTOR | Freq: Two times a day (BID) | SUBCUTANEOUS | Status: AC

## 2022-10-14 MED ORDER — GABAPENTIN 300 MG PO CAPS
300.0000 mg | ORAL_CAPSULE | Freq: Three times a day (TID) | ORAL | 0 refills | Status: AC
  Filled 2022-10-14: qty 90, 30d supply, fill #0

## 2022-10-14 MED ORDER — METHOCARBAMOL 500 MG PO TABS
1000.0000 mg | ORAL_TABLET | Freq: Four times a day (QID) | ORAL | 1 refills | Status: AC
Start: 2022-10-14 — End: ?
  Filled 2022-10-14: qty 60, 8d supply, fill #0

## 2022-10-14 MED ORDER — GERHARDT'S BUTT CREAM: 1.0000 | TOPICAL_CREAM | Freq: Two times a day (BID) | CUTANEOUS | Status: AC

## 2022-10-14 MED ORDER — BACLOFEN 5 MG PO TABS
5.0000 mg | ORAL_TABLET | Freq: Three times a day (TID) | ORAL | 0 refills | Status: AC | PRN
  Filled 2022-10-14: qty 30, 10d supply, fill #0

## 2022-10-14 MED ORDER — ACETAMINOPHEN 325 MG PO TABS: 325.0000 mg | ORAL_TABLET | ORAL | Status: AC | PRN

## 2022-10-14 MED ORDER — OXYCODONE-ACETAMINOPHEN 5-325 MG PO TABS
1.0000 | ORAL_TABLET | Freq: Two times a day (BID) | ORAL | 0 refills | Status: AC | PRN
  Filled 2022-10-14: qty 30, 15d supply, fill #0

## 2022-10-14 MED ORDER — BACLOFEN 5 MG HALF TABLET
ORAL_TABLET | ORAL | Status: AC
  Administered 2022-10-14: 5 mg via ORAL
  Filled 2022-10-14: qty 1

## 2022-10-14 NOTE — Progress Notes (Signed)
PROGRESS NOTE   Subjective/Complaints:  Pt reports rough night- didn't sleep due to shoulder pain.   Will send home on 7 days of pain meds- but likely won't need more.   Robaxin helpful for R buttock pain, but still bothersome- best with Elbow by Dom on myofascial release.   ROS:  Pt denies SOB, abd pain, CP, N/V/C/D, and vision changes   Objective:   No results found. Recent Labs    10/14/22 0656  WBC 6.3  HGB 13.8  HCT 42.5  PLT 267    Recent Labs    10/14/22 0656  NA 134*  K 4.2  CL 98  CO2 29  GLUCOSE 130*  BUN 29*  CREATININE 1.19  CALCIUM 9.0     Intake/Output Summary (Last 24 hours) at 10/14/2022 0856 Last data filed at 10/14/2022 0800 Gross per 24 hour  Intake 1096 ml  Output 2175 ml  Net -1079 ml        Physical Exam: Vital Signs Blood pressure 102/67, pulse 61, temperature 97.8 F (36.6 C), resp. rate 19, height 5\' 10"  (1.778 m), weight 105.7 kg, SpO2 96%.    General: awake, alert, appropriate, initially asleep- on side; NAD HENT: conjugate gaze; oropharynx moist CV: regular rate; no JVD Pulmonary: CTA B/L; no W/R/R- good air movement GI: soft, NT, ND, (+)BS Psychiatric: appropriate Neurological: Ox3 MS; R buttock and R gluteus medius trigger points palpated Neurologic: Alert and oriented x 3  Antigravity and against resistance all extremities Sensory exam normal sensation to light touch bilateral upper and lower extremities Tone is normal in bilateral lower extremities Musculoskeletal: Full range of motion in all 4 extremities. No joint swelling  Prior exams: Skin: small amount of nickel sized sanguinous drainage on dressing- incision looks great- mild swelling but no erythema  Assessment/Plan: 1. Functional deficits which require 3+ hours per day of interdisciplinary therapy in a comprehensive inpatient rehab setting. Physiatrist is providing close team supervision and 24  hour management of active medical problems listed below. Physiatrist and rehab team continue to assess barriers to discharge/monitor patient progress toward functional and medical goals  Care Tool:  Bathing    Body parts bathed by patient: Right arm, Right lower leg, Left lower leg, Left arm, Chest, Face, Abdomen, Front perineal area, Buttocks, Right upper leg, Left upper leg         Bathing assist Assist Level: Supervision/Verbal cueing     Upper Body Dressing/Undressing Upper body dressing   What is the patient wearing?: Pull over shirt, Orthosis    Upper body assist Assist Level: Independent with assistive device    Lower Body Dressing/Undressing Lower body dressing      What is the patient wearing?: Underwear/pull up, Pants     Lower body assist Assist for lower body dressing: Supervision/Verbal cueing     Toileting Toileting    Toileting assist Assist for toileting: Supervision/Verbal cueing     Transfers Chair/bed transfer  Transfers assist     Chair/bed transfer assist level: Independent with assistive device     Locomotion Ambulation   Ambulation assist      Assist level: Independent with assistive device Assistive device: Rollator Max distance:  200 ft   Walk 10 feet activity   Assist     Assist level: Independent with assistive device Assistive device: Rollator   Walk 50 feet activity   Assist    Assist level: Independent with assistive device Assistive device: Rollator    Walk 150 feet activity   Assist    Assist level: Independent with assistive device Assistive device: Rollator    Walk 10 feet on uneven surface  activity   Assist     Assist level: Independent with assistive device Assistive device: Rollator   Wheelchair     Assist Is the patient using a wheelchair?: No Type of Wheelchair: Manual    Wheelchair assist level: Supervision/Verbal cueing Max wheelchair distance: >400 ft    Wheelchair 50  feet with 2 turns activity    Assist        Assist Level: Supervision/Verbal cueing   Wheelchair 150 feet activity     Assist      Assist Level: Supervision/Verbal cueing   Blood pressure 102/67, pulse 61, temperature 97.8 F (36.6 C), resp. rate 19, height 5\' 10"  (1.778 m), weight 105.7 kg, SpO2 96%.  Medical Problem List and Plan: 1. Functional deficits secondary to lumbar spondylolisthesis with lumbar stenosis              -patient may  shower             -ELOS/Goals: 7-10d, Mod I   D/c 7/22  D/c today- will need f/u with me 2.  Antithrombotics: -DVT/anticoagulation:  Pharmaceutical: Heparin             -antiplatelet therapy: Aspirin 81 mg daily   3. Pain Management: Tylenol as needed             -Continue gabapentin 300 mg 3 times daily   7/16- also has Percocet q4 hours- says brings pain down to 2-3/10 with rest- asked pt to ask for pain meds prior to therapy.   7/17- taking q4-5 hours prn- to keep pain under control- wil add Tramadol 50 mg q6 hours to try and calm the pain in  the time in between meds  7/18- Will d/c tramadol- not helpful- will increase robaxin to 750 mg QID and if doesn't help enough, will do trigger point injections Tuesday.    - 7/20: See above ; Robaxin increased to 1000 mg QID yestyerday, add PRN baclofen 5 mg today. Only using percocet BID, will monitor for continued wean.   - 7/21: Only using percocet every other day, reduce to BID PRN, can probably DC without, defer to primary team  722- can give 7 days of pain meds- however probably won't need past that- wants Trp injections- did this AM as detailed below- verbal consent done- couldn't find paperwork- Ladona Ridgel- LPN was witness to consent.  4. Mood/Behavior/Sleep: LCSW to evaluate and provide emotional support             -antipsychotic agents: n/a   5. Neuropsych/cognition: This patient is capable of making decisions on his own behalf.   6. Skin/Wound Care: Routine skin care checks    7/16- incision looks OK- con't regimen  7. Fluids/Electrolytes/Nutrition: Routine Is and Os and follow-up chemistries   8: Hypertension: monitor TID and prn             -Continue amlodipine 5 mg daily             -Continue metoprolol succinate 50 mg nightly             -  Continue benazepril 20 mg daily             -Continue hydrochlorothiazide 25 mg daily   7/18- BP controlled- con't regimen- HR 54 this AM- but usually 70's- will maintain Metoprolol dose.  - well controlled 7/20-21  Monitor    10/14/2022    5:43 AM 10/13/2022    8:22 PM 10/13/2022    2:48 PM  Vitals with BMI  Systolic 102 105 161  Diastolic 67 60 63  Pulse 61 69 62     9: Hyperlipidemia: continue statin   10: DM: CBGs 4 times daily; A1c = 7.5% (home = Jardiance, Soliqua,metformin, Actos)             -Continue Jardiance 25 mg daily             -Continue Semglee 30 units twice daily             -Continue metformin 1000 mg twice daily             -Continue Actos 30 mg nightly   7/16- CBGs somewhat labile- 92-242 in last 24 hours- con't regimen and monitor trend to see if needs to make changes.   7/17- will reduce Semglee to 25 units BID- due to hypoglycemia this AM- will only give 15 units this AM and hold Jardiance this Am- give Metformin.   7/17- reduced Semglee to 20 units BID- for hypoglycemia event this AM- to 67- and levels really controlled- really too controlled   - well controlled  7/22- well controlled- cont' regimen Recent Labs    10/13/22 1615 10/13/22 2138 10/14/22 0622  GLUCAP 182* 137* 130*      11. Azotemia  7/16- BUN 26- will encourage fluids and recheck Thursday  7/18- BUN down to 22  12. Hematoma  7/18- more drainage from incision yesterday- but looks much better this AM.   7/22 Patient here for trigger point injections for  Consent done and on chart.  Cleaned areas with alcohol and injected using a 27 gauge 1.5 inch needle  Injected 3cc- none wasted Using 1% Lidocaine with no  EPI  Upper traps Levators Posterior scalenes Middle scalenes Splenius Capitus Pectoralis Major Rhomboids Infraspinatus Teres Major/minor Thoracic paraspinals Lumbar paraspinals R only Other injections- gluteus medius x2 on R and gluteeus maximus x2 as well as trochanteric area on R and ischial tuberosity   Patient's level of pain prior was Current level of pain after injections is  There was no bleeding or complications.  Patient was advised to drink a lot of water on day after injections to flush system Will have increased soreness for 12-48 hours after injections.  Can use Lidocaine patches the day AFTER injections Can use theracane on day of injections in places didn't inject Can use heating pad 4-6 hours AFTER injections   I spent a total of  42  minutes on total care today- >50% coordination of care- due to   LOS: 7 days A FACE TO FACE EVALUATION WAS PERFORMED  Adonna Horsley 10/14/2022, 8:56 AM

## 2022-10-14 NOTE — Progress Notes (Signed)
Inpatient Rehabilitation Care Coordinator Discharge Note   Patient Details  Name: Dwayne Nelson MRN: [id number] Date of Birth: 10-Feb-1956   Discharge location: HOME WITH SON AND DAUGHTER [place] WHO ARE THERE IN THE EVENINGS  Length of Stay: 7 DAYS  Discharge activity level: MOD/I LEVEL  Home/community participation: ACTIVE  Patient response WU:JWJXBJ Literacy - How often do you need to have someone help you when you read instructions, pamphlets, or other written material from your doctor or pharmacy?: Never  Patient response YN:WGNFAO Isolation - How often do you feel lonely or isolated from those around you?: Never  Services provided included: MD, RD, PT, OT, RN, CM, Pharmacy, SW  Financial Services:  Financial Services Utilized: Medicare    Choices offered to/list presented to: PT  Follow-up services arranged:  Home Health, Patient/Family request agency HH/DME Home Health Agency: [facility]  PT  OT  RN  HAS DME AND PLANS TO BORROW A ROLLATOR CUE TO NOT COVERED SINCE JUST RECEIVED A RW     HH/DME Requested Agency: REFERRAL MADE WHILE ON ACUTE  Patient response to transportation need: Is the patient able to respond to transportation needs?: Yes In the past 12 months, has lack of transportation kept you from medical appointments or from getting medications?: No In the past 12 months, has lack of transportation kept you from meetings, work, or from getting things needed for daily living?: No   Patient/Family verbalized understanding of follow-up arrangements:  Yes  Individual responsible for coordination of the follow-up plan: Artem-SON [phone]  Confirmed correct DME delivered: Lucy Chris 10/14/2022    Comments (or additional information):PT DID WELL AND PROGRESSED QUICKLY REACHED MOD/I LEVEL.  Summary of Stay    Date/Time Discharge Planning CSW  10/08/22 0825 new evaluation going to son's for one week at discharge, will have intermittent assist  RGD       Cynthya Yam, Lemar Livings

## 2022-10-17 DIAGNOSIS — Z85828 Personal history of other malignant neoplasm of skin: Secondary | ICD-10-CM | POA: Diagnosis not present

## 2022-10-17 DIAGNOSIS — Z7982 Long term (current) use of aspirin: Secondary | ICD-10-CM | POA: Diagnosis not present

## 2022-10-17 DIAGNOSIS — M4316 Spondylolisthesis, lumbar region: Secondary | ICD-10-CM | POA: Diagnosis not present

## 2022-10-17 DIAGNOSIS — Z794 Long term (current) use of insulin: Secondary | ICD-10-CM | POA: Diagnosis not present

## 2022-10-17 DIAGNOSIS — E119 Type 2 diabetes mellitus without complications: Secondary | ICD-10-CM | POA: Diagnosis not present

## 2022-10-17 DIAGNOSIS — Z981 Arthrodesis status: Secondary | ICD-10-CM | POA: Diagnosis not present

## 2022-10-17 DIAGNOSIS — M545 Low back pain, unspecified: Secondary | ICD-10-CM | POA: Diagnosis not present

## 2022-10-17 DIAGNOSIS — Z9181 History of falling: Secondary | ICD-10-CM | POA: Diagnosis not present

## 2022-10-17 DIAGNOSIS — E785 Hyperlipidemia, unspecified: Secondary | ICD-10-CM | POA: Diagnosis not present

## 2022-10-17 DIAGNOSIS — Z4789 Encounter for other orthopedic aftercare: Secondary | ICD-10-CM | POA: Diagnosis not present

## 2022-10-17 DIAGNOSIS — Z556 Problems related to health literacy: Secondary | ICD-10-CM | POA: Diagnosis not present

## 2022-10-17 DIAGNOSIS — I10 Essential (primary) hypertension: Secondary | ICD-10-CM | POA: Diagnosis not present

## 2022-10-17 DIAGNOSIS — Z7984 Long term (current) use of oral hypoglycemic drugs: Secondary | ICD-10-CM | POA: Diagnosis not present

## 2022-10-18 DIAGNOSIS — I10 Essential (primary) hypertension: Secondary | ICD-10-CM | POA: Diagnosis not present

## 2022-10-18 DIAGNOSIS — Z4789 Encounter for other orthopedic aftercare: Secondary | ICD-10-CM | POA: Diagnosis not present

## 2022-10-18 DIAGNOSIS — M545 Low back pain, unspecified: Secondary | ICD-10-CM | POA: Diagnosis not present

## 2022-10-18 DIAGNOSIS — E785 Hyperlipidemia, unspecified: Secondary | ICD-10-CM | POA: Diagnosis not present

## 2022-10-18 DIAGNOSIS — E119 Type 2 diabetes mellitus without complications: Secondary | ICD-10-CM | POA: Diagnosis not present

## 2022-10-18 DIAGNOSIS — M4316 Spondylolisthesis, lumbar region: Secondary | ICD-10-CM | POA: Diagnosis not present

## 2022-10-21 DIAGNOSIS — M4326 Fusion of spine, lumbar region: Secondary | ICD-10-CM | POA: Diagnosis not present

## 2022-10-21 DIAGNOSIS — T819XXD Unspecified complication of procedure, subsequent encounter: Secondary | ICD-10-CM | POA: Diagnosis not present

## 2022-10-21 DIAGNOSIS — E785 Hyperlipidemia, unspecified: Secondary | ICD-10-CM | POA: Diagnosis not present

## 2022-10-21 DIAGNOSIS — E119 Type 2 diabetes mellitus without complications: Secondary | ICD-10-CM | POA: Diagnosis not present

## 2022-10-21 DIAGNOSIS — Z4789 Encounter for other orthopedic aftercare: Secondary | ICD-10-CM | POA: Diagnosis not present

## 2022-10-21 DIAGNOSIS — Z09 Encounter for follow-up examination after completed treatment for conditions other than malignant neoplasm: Secondary | ICD-10-CM | POA: Diagnosis not present

## 2022-10-21 DIAGNOSIS — M4316 Spondylolisthesis, lumbar region: Secondary | ICD-10-CM | POA: Diagnosis not present

## 2022-10-21 DIAGNOSIS — I10 Essential (primary) hypertension: Secondary | ICD-10-CM | POA: Diagnosis not present

## 2022-10-21 DIAGNOSIS — M545 Low back pain, unspecified: Secondary | ICD-10-CM | POA: Diagnosis not present

## 2022-10-22 DIAGNOSIS — E785 Hyperlipidemia, unspecified: Secondary | ICD-10-CM | POA: Diagnosis not present

## 2022-10-22 DIAGNOSIS — I10 Essential (primary) hypertension: Secondary | ICD-10-CM | POA: Diagnosis not present

## 2022-10-22 DIAGNOSIS — M4316 Spondylolisthesis, lumbar region: Secondary | ICD-10-CM | POA: Diagnosis not present

## 2022-10-22 DIAGNOSIS — E119 Type 2 diabetes mellitus without complications: Secondary | ICD-10-CM | POA: Diagnosis not present

## 2022-10-22 DIAGNOSIS — Z4789 Encounter for other orthopedic aftercare: Secondary | ICD-10-CM | POA: Diagnosis not present

## 2022-10-22 DIAGNOSIS — M545 Low back pain, unspecified: Secondary | ICD-10-CM | POA: Diagnosis not present

## 2022-10-24 DIAGNOSIS — E119 Type 2 diabetes mellitus without complications: Secondary | ICD-10-CM | POA: Diagnosis not present

## 2022-10-24 DIAGNOSIS — I10 Essential (primary) hypertension: Secondary | ICD-10-CM | POA: Diagnosis not present

## 2022-10-24 DIAGNOSIS — E785 Hyperlipidemia, unspecified: Secondary | ICD-10-CM | POA: Diagnosis not present

## 2022-10-24 DIAGNOSIS — M4316 Spondylolisthesis, lumbar region: Secondary | ICD-10-CM | POA: Diagnosis not present

## 2022-10-24 DIAGNOSIS — Z4789 Encounter for other orthopedic aftercare: Secondary | ICD-10-CM | POA: Diagnosis not present

## 2022-10-24 DIAGNOSIS — M545 Low back pain, unspecified: Secondary | ICD-10-CM | POA: Diagnosis not present

## 2022-10-28 DIAGNOSIS — M4316 Spondylolisthesis, lumbar region: Secondary | ICD-10-CM | POA: Diagnosis not present

## 2022-10-28 DIAGNOSIS — E785 Hyperlipidemia, unspecified: Secondary | ICD-10-CM | POA: Diagnosis not present

## 2022-10-28 DIAGNOSIS — I10 Essential (primary) hypertension: Secondary | ICD-10-CM | POA: Diagnosis not present

## 2022-10-28 DIAGNOSIS — Z4789 Encounter for other orthopedic aftercare: Secondary | ICD-10-CM | POA: Diagnosis not present

## 2022-10-28 DIAGNOSIS — M545 Low back pain, unspecified: Secondary | ICD-10-CM | POA: Diagnosis not present

## 2022-10-28 DIAGNOSIS — E119 Type 2 diabetes mellitus without complications: Secondary | ICD-10-CM | POA: Diagnosis not present

## 2022-10-29 DIAGNOSIS — E785 Hyperlipidemia, unspecified: Secondary | ICD-10-CM | POA: Diagnosis not present

## 2022-10-29 DIAGNOSIS — M4316 Spondylolisthesis, lumbar region: Secondary | ICD-10-CM | POA: Diagnosis not present

## 2022-10-29 DIAGNOSIS — E119 Type 2 diabetes mellitus without complications: Secondary | ICD-10-CM | POA: Diagnosis not present

## 2022-10-29 DIAGNOSIS — M545 Low back pain, unspecified: Secondary | ICD-10-CM | POA: Diagnosis not present

## 2022-10-29 DIAGNOSIS — Z4789 Encounter for other orthopedic aftercare: Secondary | ICD-10-CM | POA: Diagnosis not present

## 2022-10-29 DIAGNOSIS — I10 Essential (primary) hypertension: Secondary | ICD-10-CM | POA: Diagnosis not present

## 2022-11-05 DIAGNOSIS — M4316 Spondylolisthesis, lumbar region: Secondary | ICD-10-CM | POA: Diagnosis not present

## 2022-11-05 DIAGNOSIS — E785 Hyperlipidemia, unspecified: Secondary | ICD-10-CM | POA: Diagnosis not present

## 2022-11-05 DIAGNOSIS — M545 Low back pain, unspecified: Secondary | ICD-10-CM | POA: Diagnosis not present

## 2022-11-05 DIAGNOSIS — E119 Type 2 diabetes mellitus without complications: Secondary | ICD-10-CM | POA: Diagnosis not present

## 2022-11-05 DIAGNOSIS — I10 Essential (primary) hypertension: Secondary | ICD-10-CM | POA: Diagnosis not present

## 2022-11-05 DIAGNOSIS — Z4789 Encounter for other orthopedic aftercare: Secondary | ICD-10-CM | POA: Diagnosis not present

## 2022-11-14 DIAGNOSIS — M4316 Spondylolisthesis, lumbar region: Secondary | ICD-10-CM | POA: Diagnosis not present

## 2022-11-14 DIAGNOSIS — I10 Essential (primary) hypertension: Secondary | ICD-10-CM | POA: Diagnosis not present

## 2022-11-14 DIAGNOSIS — Z4789 Encounter for other orthopedic aftercare: Secondary | ICD-10-CM | POA: Diagnosis not present

## 2022-11-14 DIAGNOSIS — E785 Hyperlipidemia, unspecified: Secondary | ICD-10-CM | POA: Diagnosis not present

## 2022-11-14 DIAGNOSIS — M545 Low back pain, unspecified: Secondary | ICD-10-CM | POA: Diagnosis not present

## 2022-11-14 DIAGNOSIS — E119 Type 2 diabetes mellitus without complications: Secondary | ICD-10-CM | POA: Diagnosis not present

## 2022-11-16 DIAGNOSIS — E119 Type 2 diabetes mellitus without complications: Secondary | ICD-10-CM | POA: Diagnosis not present

## 2022-11-16 DIAGNOSIS — Z85828 Personal history of other malignant neoplasm of skin: Secondary | ICD-10-CM | POA: Diagnosis not present

## 2022-11-16 DIAGNOSIS — M545 Low back pain, unspecified: Secondary | ICD-10-CM | POA: Diagnosis not present

## 2022-11-16 DIAGNOSIS — M4316 Spondylolisthesis, lumbar region: Secondary | ICD-10-CM | POA: Diagnosis not present

## 2022-11-16 DIAGNOSIS — Z556 Problems related to health literacy: Secondary | ICD-10-CM | POA: Diagnosis not present

## 2022-11-16 DIAGNOSIS — Z794 Long term (current) use of insulin: Secondary | ICD-10-CM | POA: Diagnosis not present

## 2022-11-16 DIAGNOSIS — Z7984 Long term (current) use of oral hypoglycemic drugs: Secondary | ICD-10-CM | POA: Diagnosis not present

## 2022-11-16 DIAGNOSIS — Z9181 History of falling: Secondary | ICD-10-CM | POA: Diagnosis not present

## 2022-11-16 DIAGNOSIS — E785 Hyperlipidemia, unspecified: Secondary | ICD-10-CM | POA: Diagnosis not present

## 2022-11-16 DIAGNOSIS — I10 Essential (primary) hypertension: Secondary | ICD-10-CM | POA: Diagnosis not present

## 2022-11-16 DIAGNOSIS — Z7982 Long term (current) use of aspirin: Secondary | ICD-10-CM | POA: Diagnosis not present

## 2022-11-16 DIAGNOSIS — Z981 Arthrodesis status: Secondary | ICD-10-CM | POA: Diagnosis not present

## 2022-11-16 DIAGNOSIS — Z4789 Encounter for other orthopedic aftercare: Secondary | ICD-10-CM | POA: Diagnosis not present

## 2022-11-18 DIAGNOSIS — I10 Essential (primary) hypertension: Secondary | ICD-10-CM | POA: Diagnosis not present

## 2022-11-18 DIAGNOSIS — M4316 Spondylolisthesis, lumbar region: Secondary | ICD-10-CM | POA: Diagnosis not present

## 2022-11-18 DIAGNOSIS — Z4789 Encounter for other orthopedic aftercare: Secondary | ICD-10-CM | POA: Diagnosis not present

## 2022-11-18 DIAGNOSIS — M545 Low back pain, unspecified: Secondary | ICD-10-CM | POA: Diagnosis not present

## 2022-11-18 DIAGNOSIS — E119 Type 2 diabetes mellitus without complications: Secondary | ICD-10-CM | POA: Diagnosis not present

## 2022-11-18 DIAGNOSIS — E785 Hyperlipidemia, unspecified: Secondary | ICD-10-CM | POA: Diagnosis not present

## 2022-11-20 ENCOUNTER — Encounter: Payer: Medicare Other | Admitting: Physical Medicine and Rehabilitation

## 2022-12-04 DIAGNOSIS — E785 Hyperlipidemia, unspecified: Secondary | ICD-10-CM | POA: Diagnosis not present

## 2022-12-04 DIAGNOSIS — M4316 Spondylolisthesis, lumbar region: Secondary | ICD-10-CM | POA: Diagnosis not present

## 2022-12-04 DIAGNOSIS — E119 Type 2 diabetes mellitus without complications: Secondary | ICD-10-CM | POA: Diagnosis not present

## 2022-12-04 DIAGNOSIS — I10 Essential (primary) hypertension: Secondary | ICD-10-CM | POA: Diagnosis not present

## 2022-12-04 DIAGNOSIS — Z4789 Encounter for other orthopedic aftercare: Secondary | ICD-10-CM | POA: Diagnosis not present

## 2022-12-04 DIAGNOSIS — M545 Low back pain, unspecified: Secondary | ICD-10-CM | POA: Diagnosis not present

## 2023-01-15 DIAGNOSIS — E291 Testicular hypofunction: Secondary | ICD-10-CM | POA: Diagnosis not present

## 2023-01-15 DIAGNOSIS — Z125 Encounter for screening for malignant neoplasm of prostate: Secondary | ICD-10-CM | POA: Diagnosis not present

## 2023-01-15 DIAGNOSIS — E782 Mixed hyperlipidemia: Secondary | ICD-10-CM | POA: Diagnosis not present

## 2023-01-15 DIAGNOSIS — E1165 Type 2 diabetes mellitus with hyperglycemia: Secondary | ICD-10-CM | POA: Diagnosis not present

## 2023-01-15 DIAGNOSIS — Z23 Encounter for immunization: Secondary | ICD-10-CM | POA: Diagnosis not present

## 2023-01-15 DIAGNOSIS — I10 Essential (primary) hypertension: Secondary | ICD-10-CM | POA: Diagnosis not present

## 2023-01-20 ENCOUNTER — Encounter: Payer: Self-pay | Admitting: Physical Medicine and Rehabilitation

## 2023-01-20 ENCOUNTER — Encounter
Payer: Medicare Other | Attending: Physical Medicine and Rehabilitation | Admitting: Physical Medicine and Rehabilitation

## 2023-01-20 VITALS — BP 146/82 | HR 52 | Ht 70.0 in | Wt 258.0 lb

## 2023-01-20 DIAGNOSIS — G5701 Lesion of sciatic nerve, right lower limb: Secondary | ICD-10-CM | POA: Insufficient documentation

## 2023-01-20 DIAGNOSIS — M4326 Fusion of spine, lumbar region: Secondary | ICD-10-CM | POA: Diagnosis not present

## 2023-01-20 DIAGNOSIS — R269 Unspecified abnormalities of gait and mobility: Secondary | ICD-10-CM | POA: Diagnosis not present

## 2023-01-20 DIAGNOSIS — M4316 Spondylolisthesis, lumbar region: Secondary | ICD-10-CM | POA: Insufficient documentation

## 2023-01-20 NOTE — Patient Instructions (Signed)
Pt is a 67 yr old male with hx of lumbar spondylolisthesis with lumbar stenosis s/p lumbar decompression L3-L5- with fusion 10/01/22- with resolved post op pain- also has HTN, HLD, DM last A1c 7.5;  Here for f/u on Lumbar stenosis.    Tennis ball- hold pressure on back of thighs- put under leg- and hold pressure no massage.   Can do 5 minutes at a time.   -can use tennis ball for buttocks- if needed for back /buttock pain.   2.  Use tylenol- for pain as needed.   3.  Excited about pt's weight loss!!   4. Would prefer you switch walking stick to R side if possible.    5.  We discussed weight loss- and wouldn't lose more than 10 more lbs.    6. Can take up to 1 year to heal completely.    7. FU as needed

## 2023-01-20 NOTE — Progress Notes (Signed)
Subjective:    Patient ID: Dwayne Nelson, male    DOB: January 18, 1956, 67 y.o.   MRN: [id number]  HPI  Pt is a 67 yr old male with hx of lumbar spondylolisthesis with lumbar stenosis s/p lumbar decompression L3-L5- with fusion 10/01/22- with resolved post op pain- also has HTN, HLD, DM last A1c 7.5;  Here for f/u on Lumbar stenosis.    Buttock and piriformis pain- doing great- no issues no more buttock pain-  Occ muscles in back of legs are tight Uses bands- therabands to stretch out legs and stands at counter and leans forward-   Done with back brace Walking no AD in house- takes hiking stick to walk outside the house.  Hasn't used RW since rained hard-    Takes tylenol 1x/day- usually in AM- works for him!  Does HEP- 15 minutes-30 minutes/day. Depends  ont he day.   Done with therapy-  Moved back home with dogs- labor day weekend.  Son lives in [place]- lived with him.   Bad luck rut!    Pain Inventory Average Pain 1 Pain Right Now 1 My pain is  .  In the last 24 hours, has pain interfered with the following? General activity 5 Relation with others 6 Enjoyment of life 6 What TIME of day is your pain at its worst? morning  Sleep (in general) Good  Pain is worse with: unsure Pain improves with:  massage gun Relief from Meds: 9  use a cane how many minutes can you walk? 30 ability to climb steps?  yes do you drive?  yes  retired  bladder control problems trouble walking  Any changes since last visit?  no  Any changes since last visit?  no    Family History  Problem Relation Age of Onset   Bladder Cancer Father    Social History   Socioeconomic History   Marital status: Widowed    Spouse name: Not on file   Number of children: 1   Years of education: Not on file   Highest education level: Not on file  Occupational History   Not on file  Tobacco Use   Smoking status: Never   Smokeless tobacco: Never  Vaping Use   Vaping status: Never Used   Substance and Sexual Activity   Alcohol use: Not Currently   Drug use: Never   Sexual activity: Yes  Other Topics Concern   Not on file  Social History Narrative   Not on file   Social Determinants of Health   Financial Resource Strain: Not on file  Food Insecurity: No Food Insecurity (10/04/2022)   Hunger Vital Sign    Worried About Running Out of Food in the Last Year: Never true    Ran Out of Food in the Last Year: Never true  Transportation Needs: No Transportation Needs (10/04/2022)   PRAPARE - Administrator, Civil Service (Medical): No    Lack of Transportation (Non-Medical): No  Physical Activity: Not on file  Stress: Not on file  Social Connections: Not on file   Past Surgical History:  Procedure Laterality Date   CIRCUMCISION N/A 03/29/2021   Procedure: DORSAL SLIT CIRCUMCISION ADULT;  Surgeon: Marcine Matar, MD;  Location: [facility];  Service: Urology;  Laterality: N/A;   colonoscopy N/A    NO PAST SURGERIES     SKIN CANCER EXCISION     With Dr Terri Piedra outpatient procedure   Past Medical History:  Diagnosis Date  Cancer [facility])    LPFA skin cancer Dr. Terri Piedra   Diabetes mellitus without complication (HCC)    Hyperlipidemia    Hypertension    BP (!) 146/82   Pulse (!) 52   Ht 5\' 10"  (1.778 m)   Wt 258 lb (117 kg)   SpO2 91%   BMI 37.02 kg/m   Opioid Risk Score:   Fall Risk Score:  `1  Depression screen [facility] 2/9     01/20/2023    8:55 AM 01/22/2022   10:50 AM  Depression screen PHQ 2/9  Decreased Interest 0 0  Down, Depressed, Hopeless 1 0  PHQ - 2 Score 1 0  Altered sleeping 0   Tired, decreased energy 1   Change in appetite 0   Feeling bad or failure about yourself  1   Trouble concentrating 1   Moving slowly or fidgety/restless 0   Suicidal thoughts 0   PHQ-9 Score 4   Difficult doing work/chores Not difficult at all      Review of Systems  Musculoskeletal:  Positive for back pain.       Right buttock  and upper back of leg pain  All other systems reviewed and are negative.      Objective:   Physical Exam  Awake, alert, appropriate,  using hiking stick to walk, NAD MS: RLE- HF 5-/5; KE/KF 5-/5 DF and PF 5/5 LLE-  HF 4+/5; KE/KF 5-/5; DF/PF 5-/5  Neuro: Intact to light touch in B/L LE's-    Extremities- 1-2+ LE edema to distal calves B/L    Gait- Somewhat shuffling gait R foot turned out somewhat with gait, but not weak/foot drop 3 point turn to turn around     Assessment & Plan:   Pt is a 67 yr old male with hx of lumbar spondylolisthesis with lumbar stenosis s/p lumbar decompression L3-L5- with fusion 10/01/22- with resolved post op pain- also has HTN, HLD, DM last A1c 7.5;  Here for f/u on Lumbar stenosis.    Tennis ball- hold pressure on back of thighs- put under leg- and hold pressure no massage.   Can do 5 minutes at a time.   -can use tennis ball for buttocks- if needed for back /buttock pain.   2.  Use tylenol- for pain as needed.   3.  Excited about pt's weight loss!!   4. Would prefer you switch walking stick to R side if possible.    5.  We discussed weight loss- and wouldn't lose more than 10 more lbs.    6. Can take up to 1 year to heal completely.    7. FU as needed   I spent a total of  32   minutes on total care today- >50% coordination of care- due to  discussion about piriformis- and lumbar stenosis-

## 2023-01-24 DIAGNOSIS — M4316 Spondylolisthesis, lumbar region: Secondary | ICD-10-CM | POA: Diagnosis not present

## 2023-04-16 ENCOUNTER — Other Ambulatory Visit (HOSPITAL_COMMUNITY): Payer: Self-pay

## 2023-04-17 ENCOUNTER — Other Ambulatory Visit (HOSPITAL_COMMUNITY): Payer: Self-pay

## 2023-07-07 DIAGNOSIS — I10 Essential (primary) hypertension: Secondary | ICD-10-CM | POA: Diagnosis not present

## 2023-07-07 DIAGNOSIS — E1165 Type 2 diabetes mellitus with hyperglycemia: Secondary | ICD-10-CM | POA: Diagnosis not present

## 2023-07-17 DIAGNOSIS — E1165 Type 2 diabetes mellitus with hyperglycemia: Secondary | ICD-10-CM | POA: Diagnosis not present

## 2023-07-17 DIAGNOSIS — L723 Sebaceous cyst: Secondary | ICD-10-CM | POA: Diagnosis not present

## 2023-07-17 DIAGNOSIS — Z1211 Encounter for screening for malignant neoplasm of colon: Secondary | ICD-10-CM | POA: Diagnosis not present

## 2023-07-17 DIAGNOSIS — E782 Mixed hyperlipidemia: Secondary | ICD-10-CM | POA: Diagnosis not present

## 2023-07-17 DIAGNOSIS — Z0000 Encounter for general adult medical examination without abnormal findings: Secondary | ICD-10-CM | POA: Diagnosis not present

## 2023-07-17 DIAGNOSIS — R350 Frequency of micturition: Secondary | ICD-10-CM | POA: Diagnosis not present

## 2023-07-17 DIAGNOSIS — I10 Essential (primary) hypertension: Secondary | ICD-10-CM | POA: Diagnosis not present

## 2023-07-23 DIAGNOSIS — E782 Mixed hyperlipidemia: Secondary | ICD-10-CM | POA: Diagnosis not present

## 2023-07-23 DIAGNOSIS — I10 Essential (primary) hypertension: Secondary | ICD-10-CM | POA: Diagnosis not present

## 2023-07-23 DIAGNOSIS — E1165 Type 2 diabetes mellitus with hyperglycemia: Secondary | ICD-10-CM | POA: Diagnosis not present

## 2023-07-23 DIAGNOSIS — E785 Hyperlipidemia, unspecified: Secondary | ICD-10-CM | POA: Diagnosis not present

## 2023-07-25 DIAGNOSIS — M4316 Spondylolisthesis, lumbar region: Secondary | ICD-10-CM | POA: Diagnosis not present

## 2023-08-05 DIAGNOSIS — E1165 Type 2 diabetes mellitus with hyperglycemia: Secondary | ICD-10-CM | POA: Diagnosis not present

## 2023-08-05 DIAGNOSIS — I10 Essential (primary) hypertension: Secondary | ICD-10-CM | POA: Diagnosis not present

## 2023-08-23 DIAGNOSIS — I10 Essential (primary) hypertension: Secondary | ICD-10-CM | POA: Diagnosis not present

## 2023-08-23 DIAGNOSIS — E785 Hyperlipidemia, unspecified: Secondary | ICD-10-CM | POA: Diagnosis not present

## 2023-08-23 DIAGNOSIS — E782 Mixed hyperlipidemia: Secondary | ICD-10-CM | POA: Diagnosis not present

## 2023-08-23 DIAGNOSIS — E1165 Type 2 diabetes mellitus with hyperglycemia: Secondary | ICD-10-CM | POA: Diagnosis not present

## 2023-09-04 DIAGNOSIS — E1165 Type 2 diabetes mellitus with hyperglycemia: Secondary | ICD-10-CM | POA: Diagnosis not present

## 2023-09-04 DIAGNOSIS — I10 Essential (primary) hypertension: Secondary | ICD-10-CM | POA: Diagnosis not present

## 2023-10-04 DIAGNOSIS — E1165 Type 2 diabetes mellitus with hyperglycemia: Secondary | ICD-10-CM | POA: Diagnosis not present

## 2023-10-04 DIAGNOSIS — I10 Essential (primary) hypertension: Secondary | ICD-10-CM | POA: Diagnosis not present

## 2023-10-20 DIAGNOSIS — K5730 Diverticulosis of large intestine without perforation or abscess without bleeding: Secondary | ICD-10-CM | POA: Diagnosis not present

## 2023-10-20 DIAGNOSIS — K648 Other hemorrhoids: Secondary | ICD-10-CM | POA: Diagnosis not present

## 2023-10-20 DIAGNOSIS — D122 Benign neoplasm of ascending colon: Secondary | ICD-10-CM | POA: Diagnosis not present

## 2023-10-20 DIAGNOSIS — Z09 Encounter for follow-up examination after completed treatment for conditions other than malignant neoplasm: Secondary | ICD-10-CM | POA: Diagnosis not present

## 2023-10-20 DIAGNOSIS — Z860101 Personal history of adenomatous and serrated colon polyps: Secondary | ICD-10-CM | POA: Diagnosis not present

## 2023-10-23 DIAGNOSIS — I10 Essential (primary) hypertension: Secondary | ICD-10-CM | POA: Diagnosis not present

## 2023-10-23 DIAGNOSIS — E1165 Type 2 diabetes mellitus with hyperglycemia: Secondary | ICD-10-CM | POA: Diagnosis not present

## 2023-10-23 DIAGNOSIS — E785 Hyperlipidemia, unspecified: Secondary | ICD-10-CM | POA: Diagnosis not present

## 2023-10-23 DIAGNOSIS — E782 Mixed hyperlipidemia: Secondary | ICD-10-CM | POA: Diagnosis not present

## 2023-11-03 DIAGNOSIS — E1165 Type 2 diabetes mellitus with hyperglycemia: Secondary | ICD-10-CM | POA: Diagnosis not present

## 2023-11-03 DIAGNOSIS — I10 Essential (primary) hypertension: Secondary | ICD-10-CM | POA: Diagnosis not present

## 2023-11-23 DIAGNOSIS — E1165 Type 2 diabetes mellitus with hyperglycemia: Secondary | ICD-10-CM | POA: Diagnosis not present

## 2023-11-23 DIAGNOSIS — E785 Hyperlipidemia, unspecified: Secondary | ICD-10-CM | POA: Diagnosis not present

## 2023-11-23 DIAGNOSIS — I10 Essential (primary) hypertension: Secondary | ICD-10-CM | POA: Diagnosis not present

## 2023-11-23 DIAGNOSIS — E782 Mixed hyperlipidemia: Secondary | ICD-10-CM | POA: Diagnosis not present

## 2023-12-03 DIAGNOSIS — E1165 Type 2 diabetes mellitus with hyperglycemia: Secondary | ICD-10-CM | POA: Diagnosis not present

## 2023-12-23 DIAGNOSIS — E785 Hyperlipidemia, unspecified: Secondary | ICD-10-CM | POA: Diagnosis not present

## 2023-12-23 DIAGNOSIS — E782 Mixed hyperlipidemia: Secondary | ICD-10-CM | POA: Diagnosis not present

## 2023-12-23 DIAGNOSIS — E1165 Type 2 diabetes mellitus with hyperglycemia: Secondary | ICD-10-CM | POA: Diagnosis not present

## 2023-12-23 DIAGNOSIS — I10 Essential (primary) hypertension: Secondary | ICD-10-CM | POA: Diagnosis not present

## 2024-01-15 DIAGNOSIS — I10 Essential (primary) hypertension: Secondary | ICD-10-CM | POA: Diagnosis not present

## 2024-01-15 DIAGNOSIS — E782 Mixed hyperlipidemia: Secondary | ICD-10-CM | POA: Diagnosis not present

## 2024-01-15 DIAGNOSIS — Z23 Encounter for immunization: Secondary | ICD-10-CM | POA: Diagnosis not present

## 2024-01-15 DIAGNOSIS — E1165 Type 2 diabetes mellitus with hyperglycemia: Secondary | ICD-10-CM | POA: Diagnosis not present
